# Patient Record
Sex: Female | Born: 1955 | Race: White | Hispanic: No | Marital: Single | Smoking: Never smoker
Health system: Southern US, Community
[De-identification: ages and names within clinical notes are randomized; demographics above are authoritative.]

## PROBLEM LIST (undated history)

## (undated) DIAGNOSIS — N950 Postmenopausal bleeding: Secondary | ICD-10-CM

## (undated) DIAGNOSIS — F79 Unspecified intellectual disabilities: Secondary | ICD-10-CM

## (undated) DIAGNOSIS — Z780 Asymptomatic menopausal state: Secondary | ICD-10-CM

## (undated) HISTORY — DX: Unspecified intellectual disabilities: F79

## (undated) HISTORY — DX: Postmenopausal bleeding: N95.0

## (undated) HISTORY — DX: Asymptomatic menopausal state: Z78.0

---

## 2008-04-24 ENCOUNTER — Emergency Department: Payer: Self-pay | Admitting: Emergency Medicine

## 2010-10-15 LAB — HM PAP SMEAR: HM PAP: NEGATIVE

## 2011-06-11 ENCOUNTER — Ambulatory Visit: Payer: Self-pay | Admitting: Obstetrics and Gynecology

## 2011-10-16 HISTORY — PX: HYSTEROSCOPY WITH D & C: SHX1775

## 2012-09-15 ENCOUNTER — Ambulatory Visit: Payer: Self-pay | Admitting: Obstetrics and Gynecology

## 2012-09-15 LAB — BASIC METABOLIC PANEL
Anion Gap: 6 — ABNORMAL LOW (ref 7–16)
Calcium, Total: 8.2 mg/dL — ABNORMAL LOW (ref 8.5–10.1)
Chloride: 110 mmol/L — ABNORMAL HIGH (ref 98–107)
Co2: 25 mmol/L (ref 21–32)
EGFR (Non-African Amer.): >60
Glucose: 81 mg/dL (ref 65–99)
Osmolality: 280 (ref 275–301)
Potassium: 4.6 mmol/L (ref 3.5–5.1)

## 2012-09-15 LAB — CBC
MCH: 33.8 pg (ref 26.0–34.0)
MCV: 99 fL (ref 80–100)
Platelet: 305 10*3/uL (ref 150–440)
RBC: 3.98 10*6/uL (ref 3.80–5.20)

## 2012-09-15 LAB — LIPID PANEL
Cholesterol: 212 mg/dL — ABNORMAL HIGH (ref 0–200)
VLDL Cholesterol, Calc: 32 mg/dL (ref 5–40)

## 2012-09-15 LAB — HEPATIC FUNCTION PANEL A (ARMC)
Albumin: 3.4 g/dL (ref 3.4–5.0)
Alkaline Phosphatase: 117 U/L (ref 50–136)
SGPT (ALT): 32 U/L (ref 12–78)
Total Protein: 6.8 g/dL (ref 6.4–8.2)

## 2013-08-17 ENCOUNTER — Emergency Department: Payer: Self-pay | Admitting: Emergency Medicine

## 2013-08-26 ENCOUNTER — Emergency Department: Payer: Self-pay | Admitting: Emergency Medicine

## 2014-12-10 DIAGNOSIS — K5909 Other constipation: Secondary | ICD-10-CM | POA: Insufficient documentation

## 2014-12-10 DIAGNOSIS — F72 Severe intellectual disabilities: Secondary | ICD-10-CM | POA: Insufficient documentation

## 2014-12-10 DIAGNOSIS — G40309 Generalized idiopathic epilepsy and epileptic syndromes, not intractable, without status epilepticus: Secondary | ICD-10-CM | POA: Insufficient documentation

## 2015-02-01 NOTE — Op Note (Signed)
PATIENT NAME:  Sabrina Johnston, Sabrina Johnston MR#:  [id number] DATE OF BIRTH:  11/09/1955  DATE OF PROCEDURE:  09/15/2012  PREOPERATIVE DIAGNOSES:  1. Postmenopausal bleeding.  2. Severe mental retardation.   POSTOPERATIVE DIAGNOSES:  1. Postmenopausal bleeding.  2. Severe mental retardation.  3. Endometrial polyp.   OPERATIVE PROCEDURES:  1. Hysteroscopy.  2. Dilation and curettage of the endometrium.  3. NovaSure ablation, aborted.   SURGEON: Prentice Docker. Anahli Arvanitis, M.D.   FIRST ASSISTANT: None.   ANESTHESIA: General.   INDICATIONS: The patient is a 59 year old white female, para 0, with severe mental retardation, with history of postmenopausal bleeding, who presents for surgical evaluation and management.   FINDINGS AT SURGERY: Findings revealed a midplane uterus that was of normal size and shape. The uterus sounded to 8 cm. The cervical length was 4 cm. The cavity width upon NovaSure insertion was 2.0 cm. Because of the narrow cavity width, the procedure was not completed. There were endometrial polyps noted on hysteroscopy.   DESCRIPTION OF PROCEDURE: The patient was brought to the Operating Room where she was placed in the supine position. General anesthesia was induced without difficulty. She was placed in the dorsal lithotomy position using the bumblebee stirrups. A Betadine perineal and intravaginal prep and drape was performed in the standard fashion. A red Robinson catheter was used to drain 100 mL of urine from the bladder. The introitus was narrowed and a Sims retractor was used to facilitate exposure. A single-tooth tenaculum was placed on the anterior lip of the cervix. The uterus sounded to 8 cm.  Hegar dilators up to a #8 [place] were used to dilate the endocervical canal and the cervical length was 4 cm. The [facility] hysteroscope using lactated Ringer's as irrigant was used to identify the intrauterine findings. Following documentation, the curettage was performed with both smooth and serrated  curettes. Several polyps were removed with the endometrial curettings. The NovaSure procedure was then initiated and despite troubleshooting cavity width no greater than 2.0 cm was obtained and therefore the procedure was aborted. All instrumentation was removed from the vagina. The patient was then awakened, mobilized, and taken to the recovery room in satisfactory condition. Estimated blood loss was minimal. There were no complications. All instruments, needle, and sponge counts were verified as correct. ____________________________ Prentice Docker. Sabrina Lucci, MD mad:slb D: 09/15/2012 14:42:20 ET T: 09/15/2012 15:13:31 ET JOB#: [id number]  cc: Sabrina Deutscher A. Lateria Alderman, MD, <Dictator> Prentice Docker Jerik Falletta MD ELECTRONICALLY SIGNED 09/15/2012 16:24

## 2015-10-05 ENCOUNTER — Ambulatory Visit (INDEPENDENT_AMBULATORY_CARE_PROVIDER_SITE_OTHER): Payer: Medicare Other | Admitting: Obstetrics and Gynecology

## 2015-10-05 ENCOUNTER — Encounter: Payer: Self-pay | Admitting: Obstetrics and Gynecology

## 2015-10-05 VITALS — BP 91/69 | HR 66 | Ht 59.0 in

## 2015-10-05 DIAGNOSIS — Z780 Asymptomatic menopausal state: Secondary | ICD-10-CM | POA: Diagnosis not present

## 2015-10-05 DIAGNOSIS — Z0000 Encounter for general adult medical examination without abnormal findings: Secondary | ICD-10-CM

## 2015-10-05 DIAGNOSIS — Z124 Encounter for screening for malignant neoplasm of cervix: Secondary | ICD-10-CM | POA: Diagnosis not present

## 2015-10-05 DIAGNOSIS — Z01419 Encounter for gynecological examination (general) (routine) without abnormal findings: Secondary | ICD-10-CM

## 2015-10-05 DIAGNOSIS — F72 Severe intellectual disabilities: Secondary | ICD-10-CM | POA: Diagnosis not present

## 2015-10-05 NOTE — Patient Instructions (Signed)
1.  No Pap smear obtained. 2.  Mammogram not obtainable.  Breast exam is normal. 3.  Return in one year for annual gynecologic evaluation or as needed for gynecologic problems.

## 2015-10-05 NOTE — Progress Notes (Signed)
GYN ANNUAL PREVENTATIVE CARE ENCOUNTER NOTE  Subjective:       Sabrina Johnston is a 59 y.o. G0P0000 female here for a routine annual gynecologic exam.   The patient is a 59 year old white female, para 0, with history of severe mental retardation, who comes in from the [facility] for annual gynecologic assessment.  The patient is menopausal, on no hormone replacement therapy, and is not experiencing any abnormal uterine bleeding or vaginal discharge.  There is no reported concern regarding perineal or perianal rashes.  The patient is continent during the day but wears diapers at night.  The patient is taking calcium with vitamin D.  Mammograms have not been obtained in recent years because of noncompliance from the patient.   Gynecologic History No LMP recorded. Patient is postmenopausal. Contraception: NA Last Pap: Not needed Last mammogram: Unobtainable Hysteroscopy/D&C, (2012: Benign pathology). Hysteroscopy/D&C, (09/15/2012: Benign endometrium with polyps)  Obstetric History OB History  Gravida Para Term Preterm AB SAB TAB Ectopic Multiple Living  0 0 0 0 0 0 0 0 0 0         Past Medical History  Diagnosis Date  . Mental retardation   . Menopause   . PMB (postmenopausal bleeding)     Past Surgical History  Procedure Laterality Date  . Hysteroscopy w/d&c  2013    b9 endometrium wiht polyps    Current Outpatient Prescriptions on File Prior to Visit  Medication Sig Dispense Refill  . carbamide peroxide (DEBROX) 6.5 % otic solution 5 drops 2 (two) times daily.    . cetirizine (ZYRTEC) 10 MG tablet Take 10 mg by mouth daily.    . diazepam (VALIUM) 5 MG tablet Take 5 mg by mouth every 6 (six) hours as needed for anxiety.    . docusate sodium (COLACE) 100 MG capsule Take 100 mg by mouth 2 (two) times daily.    . hydrOXYzine (ATARAX/VISTARIL) 10 MG tablet Take 10 mg by mouth 3 (three) times daily as needed.    . lanolin-mineral oil (KERI) LOTN Apply topically as needed.     Marland Kitchen LORazepam (ATIVAN) 1 MG tablet Take 1 mg by mouth every 8 (eight) hours.    . magnesium hydroxide (MILK OF MAGNESIA) 800 MG/5ML suspension Take by mouth daily as needed for constipation.    . Multiple Vitamin (MULTIVITAMIN) tablet Take 1 tablet by mouth daily.    . potassium chloride (K-DUR,KLOR-CON) 10 MEQ tablet Take 10 mEq by mouth 2 (two) times daily.    . sennosides-docusate sodium (SENOKOT-S) 8.6-50 MG tablet Take 1 tablet by mouth daily.    Marland Kitchen tolnaftate (TINACTIN) 1 % powder Apply 1 application topically 2 (two) times daily.    . Vitamin D, Cholecalciferol, 400 UNITS CHEW Chew by mouth.     No current facility-administered medications on file prior to visit.    No Known Allergies  Social History   Social History  . Marital Status: Single    Spouse Name: N/A  . Number of Children: N/A  . Years of Education: N/A   Occupational History  . Not on file.   Social History Main Topics  . Smoking status: Never Smoker   . Smokeless tobacco: Not on file  . Alcohol Use: No  . Drug Use: No  . Sexual Activity: No   Other Topics Concern  . Not on file   Social History Narrative    Family History  Problem Relation Age of Onset  . Family history unknown: Yes  The following portions of the patient's history were reviewed and updated as appropriate: allergies, current medications, past family history, past medical history, past social history, past surgical history and problem list.  Review of Systems ROS-Not obtainable   Objective:   BP 91/69 mmHg  Pulse 66  Ht  (1.499 m)  Wt  Physical Exam  Sedated white female in no acute distress. Head and neck exam: Cephalic, atraumatic; no thyromegaly or adenopathy. Lungs: Clear. Heart: Regular rate and rhythm without murmur. Breasts: No dominant mass, adenopathy, or nipple discharge.  No skin changes. Abdomen: Soft, nontender, without organomegaly. Pelvic exam: External genitalia-normal. BUS-normal. Bimanual  exam-deferred. Rectovaginal-normal.  External exam. Extremities: Without clubbing, cyanosis or edema. Skin: Without rash.  ASSESSMENT: 1.  Annual gynecologic exam, unremarkable. 2.  Menopausal state, without postmenopausal bleeding. 3.  Severe mental retardation. 4.  Normal breast exam; patient is not able to tolerate mammography.  PLAN: 1.  Continue with calcium and vitamin D supplementation. 2.  Follow up as needed for any gynecologic issues including vaginal discharge, vaginal bleeding, or perineal symptoms suggestive of pathology. 3.  Return in 1 year for gynecologic assessment.  Herold Harms, MD  Note: This dictation was prepared with Dragon dictation along with smaller phrase technology. Any transcriptional errors that result from this process are unintentional.            Herold Harms, MD

## 2016-09-13 ENCOUNTER — Encounter: Payer: Medicare Other | Admitting: Obstetrics and Gynecology

## 2016-10-11 ENCOUNTER — Encounter: Payer: Medicare Other | Admitting: Obstetrics and Gynecology

## 2016-10-17 ENCOUNTER — Encounter: Payer: Medicare Other | Admitting: Obstetrics and Gynecology

## 2016-10-22 NOTE — Progress Notes (Signed)
GYN ANNUAL PREVENTATIVE CARE ENCOUNTER NOTE  Subjective:       Sabrina Johnston is a 61 y.o. G0P0000 female here for a routine annual gynecologic exam.   The patient is a 61 year old white female, para 0, with history of severe mental retardation, who comes in from the [facility] for annual gynecologic assessment.  The patient is menopausal, on no hormone replacement therapy, and is not experiencing any abnormal uterine bleeding or vaginal discharge.  There is no reported concern regarding perineal or perianal rashes.  The patient is continent during the day but wears diapers at night.  The patient is taking calcium with vitamin D.  Mammograms have not been obtained in recent years because of noncompliance from the patient. Patient is having frequent bowel movements-approximately 3 a with prune juice therapy and Colace therapy; caretakers are requesting a decrease in frequency of induced   Gynecologic History No LMP recorded. Patient is postmenopausal. Contraception: NA Last Pap: Not needed Last mammogram: Unobtainable Hysteroscopy/D&C, (2012: Benign pathology). Hysteroscopy/D&C, (09/15/2012: Benign endometrium with polyps)  Obstetric History OB History  Gravida Para Term Preterm AB Living  0 0 0 0 0 0  SAB TAB Ectopic Multiple Live Births  0 0 0 0          Past Medical History:  Diagnosis Date  . Menopause   . Mental retardation   . PMB (postmenopausal bleeding)     Past Surgical History:  Procedure Laterality Date  . HYSTEROSCOPY W/D&C  2013   b9 endometrium wiht polyps    Current Outpatient Prescriptions on File Prior to Visit  Medication Sig Dispense Refill  . Calcium Citrate-Vitamin D ([facility] CALCIUM CITRATE +D3 MINI) 200-250 MG-UNIT TABS Take by mouth.    . carbamide peroxide (DEBROX) 6.5 % otic solution 5 drops 2 (two) times daily.    . cetirizine (ZYRTEC) 10 MG tablet Take 10 mg by mouth daily.    . diazepam (VALIUM) 5 MG tablet Take 5 mg by mouth every 6 (six)  hours as needed for anxiety.    . divalproex (DEPAKOTE ER) 500 MG 24 hr tablet Take 500 mg by mouth 2 (two) times daily.    Marland Kitchen docusate sodium (COLACE) 100 MG capsule Take 100 mg by mouth 2 (two) times daily.    . DULoxetine (CYMBALTA) 30 MG capsule Take by mouth.    . escitalopram (LEXAPRO) 10 MG tablet Take 10 mg by mouth daily.    . furosemide (LASIX) 20 MG tablet Take by mouth.    . hydrOXYzine (ATARAX/VISTARIL) 10 MG tablet Take 10 mg by mouth 3 (three) times daily as needed.    . lanolin-mineral oil (KERI) LOTN Apply topically as needed.    Marland Kitchen LORazepam (ATIVAN) 1 MG tablet Take 1 mg by mouth every 8 (eight) hours.    . magnesium hydroxide (MILK OF MAGNESIA) 800 MG/5ML suspension Take by mouth daily as needed for constipation.    . Multiple Vitamin (MULTIVITAMIN) tablet Take 1 tablet by mouth daily.    . Multiple Vitamins-Minerals (MULTIVITAMIN ADULT PO) Take by mouth.    . potassium chloride (K-DUR) 10 MEQ tablet Take by mouth.    . potassium chloride (K-DUR,KLOR-CON) 10 MEQ tablet Take 10 mEq by mouth 2 (two) times daily.    . sennosides-docusate sodium (SENOKOT-S) 8.6-50 MG tablet Take 1 tablet by mouth daily.    Marland Kitchen tolnaftate (TINACTIN) 1 % powder Apply 1 application topically 2 (two) times daily.    . Vitamin D, Cholecalciferol, 400 UNITS  CHEW Chew by mouth.     No current facility-administered medications on file prior to visit.     No Known Allergies  Social History   Social History  . Marital status: Single    Spouse name: N/A  . Number of children: N/A  . Years of education: N/A   Occupational History  . Not on file.   Social History Main Topics  . Smoking status: Never Smoker  . Smokeless tobacco: Not on file  . Alcohol use No  . Drug use: No  . Sexual activity: No   Other Topics Concern  . Not on file   Social History Narrative  . No narrative on file    Family History  Problem Relation Age of Onset  . Family history unknown: Yes    The following  portions of the patient's history were reviewed and updated as appropriate: allergies, current medications, past family history, past medical history, past social history, past surgical history and problem list.  Review of Systems ROS-Not obtainable   Objective:   BP 93/60   Pulse (!) 118   Ht 4\' 11"  (1.499 m)   Wt 134 lb 14.4 oz (61.2 kg)   BMI 27.25 kg/m   Physical Exam  Sedated white female in no acute distress. (Sedated with 10 mg of Valium) Head and neck exam: no thyromegaly or adenopathy. Lungs: Clear. Heart: Regular rate and rhythm without murmur. Breasts: No dominant mass, adenopathy, or nipple discharge.  No skin changes. Abdomen: Soft, nontender, without organomegaly. Pelvic exam: External genitalia-normal. BUS-normal. Vagina-fair estrogen effect; no discharge Cervix-nulliparous; no lesions; no cervical motion tenderness Uterus-midplane, not enlarged, mobile, nontender Adnexa-nonpalpable and nontender Normal external exam; normal sphincter tone; no rectal masses Extremities: Without cyanosis or edema. Skin: Without rash.  ASSESSMENT: 1.  Annual gynecologic exam, unremarkable. 2.  Menopausal state, without postmenopausal bleeding. 3.  Severe mental retardation. 4.  Normal breast exam; patient is not able to tolerate mammography.  PLAN: 1. Pap smear 2.  Continue with calcium and vitamin D supplementation. 3.  Follow up as needed for any gynecologic issues including vaginal discharge, vaginal bleeding, or perineal symptoms suggestive of pathology. 4.  Return in 1 year for gynecologic assessment. 5. Recommend decreasing prune juice therapy to as needed in order to maintain regular bowel movements once a day (currently having diarrhea with several episodes of bowel movements daily)  Darol Destine, CMA  Herold Harms, MD   Note: This dictation was prepared with Dragon dictation along with smaller phrase technology. Any transcriptional errors that result  from this process are unintentional.

## 2016-10-25 ENCOUNTER — Encounter: Payer: Medicare Other | Admitting: Obstetrics and Gynecology

## 2016-10-25 ENCOUNTER — Ambulatory Visit (INDEPENDENT_AMBULATORY_CARE_PROVIDER_SITE_OTHER): Payer: Medicare Other | Admitting: Obstetrics and Gynecology

## 2016-10-25 ENCOUNTER — Encounter: Payer: Self-pay | Admitting: Obstetrics and Gynecology

## 2016-10-25 VITALS — BP 93/60 | HR 118 | Ht 59.0 in | Wt 134.9 lb

## 2016-10-25 DIAGNOSIS — Z0001 Encounter for general adult medical examination with abnormal findings: Secondary | ICD-10-CM | POA: Diagnosis not present

## 2016-10-25 DIAGNOSIS — R32 Unspecified urinary incontinence: Secondary | ICD-10-CM

## 2016-10-25 DIAGNOSIS — Z01419 Encounter for gynecological examination (general) (routine) without abnormal findings: Secondary | ICD-10-CM

## 2016-10-25 DIAGNOSIS — Z780 Asymptomatic menopausal state: Secondary | ICD-10-CM | POA: Diagnosis not present

## 2016-10-25 DIAGNOSIS — F72 Severe intellectual disabilities: Secondary | ICD-10-CM | POA: Diagnosis not present

## 2016-10-25 DIAGNOSIS — R195 Other fecal abnormalities: Secondary | ICD-10-CM | POA: Diagnosis not present

## 2016-10-25 NOTE — Patient Instructions (Addendum)
1.  Pap smear 2.  Normal breast exam today; no mammogram is ordered due to patient history of noncompliance 3. Recommend decreasing prune juice frequency to a when necessary basis in order to maintain normal formed stools on a daily basis 4. Return in 1 year or as needed if pelvic issues suggestive of pathology arise. 5. Continue calcium with vitamin D supplementation    Health Maintenance for Postmenopausal Women Introduction Menopause is a normal process in which your reproductive ability comes to an end. This process happens gradually over a span of months to years, usually between the ages of 34 and 73. Menopause is complete when you have missed 12 consecutive menstrual periods. It is important to talk with your health care provider about some of the most common conditions that affect postmenopausal women, such as heart disease, cancer, and bone loss (osteoporosis). Adopting a healthy lifestyle and getting preventive care can help to promote your health and wellness. Those actions can also lower your chances of developing some of these common conditions. What should I know about menopause? During menopause, you may experience a number of symptoms, such as:  Moderate-to-severe hot flashes.  Night sweats.  Decrease in sex drive.  Mood swings.  Headaches.  Tiredness.  Irritability.  Memory problems.  Insomnia. Choosing to treat or not to treat menopausal changes is an individual decision that you make with your health care provider. What should I know about hormone replacement therapy and supplements? Hormone therapy products are effective for treating symptoms that are associated with menopause, such as hot flashes and night sweats. Hormone replacement carries certain risks, especially as you become older. If you are thinking about using estrogen or estrogen with progestin treatments, discuss the benefits and risks with your health care provider. What should I know about heart  disease and stroke? Heart disease, heart attack, and stroke become more likely as you age. This may be due, in part, to the hormonal changes that your body experiences during menopause. These can affect how your body processes dietary fats, triglycerides, and cholesterol. Heart attack and stroke are both medical emergencies. There are many things that you can do to help prevent heart disease and stroke:  Have your blood pressure checked at least every 1-2 years. High blood pressure causes heart disease and increases the risk of stroke.  If you are 63-19 years old, ask your health care provider if you should take aspirin to prevent a heart attack or a stroke.  Do not use any tobacco products, including cigarettes, chewing tobacco, or electronic cigarettes. If you need help quitting, ask your health care provider.  It is important to eat a healthy diet and maintain a healthy weight.  Be sure to include plenty of vegetables, fruits, low-fat dairy products, and lean protein.  Avoid eating foods that are high in solid fats, added sugars, or salt (sodium).  Get regular exercise. This is one of the most important things that you can do for your health.  Try to exercise for at least 150 minutes each week. The type of exercise that you do should increase your heart rate and make you sweat. This is known as moderate-intensity exercise.  Try to do strengthening exercises at least twice each week. Do these in addition to the moderate-intensity exercise.  Know your numbers.Ask your health care provider to check your cholesterol and your blood glucose. Continue to have your blood tested as directed by your health care provider. What should I know about cancer screening? There  are several types of cancer. Take the following steps to reduce your risk and to catch any cancer development as early as possible. Breast Cancer  Practice breast self-awareness.  This means understanding how your breasts  normally appear and feel.  It also means doing regular breast self-exams. Let your health care provider know about any changes, no matter how small.  If you are 33 or older, have a clinician do a breast exam (clinical breast exam or CBE) every year. Depending on your age, family history, and medical history, it may be recommended that you also have a yearly breast X-ray (mammogram).  If you have a family history of breast cancer, talk with your health care provider about genetic screening.  If you are at high risk for breast cancer, talk with your health care provider about having an MRI and a mammogram every year.  Breast cancer (BRCA) gene test is recommended for women who have family members with BRCA-related cancers. Results of the assessment will determine the need for genetic counseling and BRCA1 and for BRCA2 testing. BRCA-related cancers include these types:  Breast. This occurs in males or females.  Ovarian.  Tubal. This may also be called fallopian tube cancer.  Cancer of the abdominal or pelvic lining (peritoneal cancer).  Prostate.  Pancreatic. Cervical, Uterine, and Ovarian Cancer  Your health care provider may recommend that you be screened regularly for cancer of the pelvic organs. These include your ovaries, uterus, and vagina. This screening involves a pelvic exam, which includes checking for microscopic changes to the surface of your cervix (Pap test).  For women ages 21-65, health care providers may recommend a pelvic exam and a Pap test every three years. For women ages 5-65, they may recommend the Pap test and pelvic exam, combined with testing for human papilloma virus (HPV), every five years. Some types of HPV increase your risk of cervical cancer. Testing for HPV may also be done on women of any age who have unclear Pap test results.  Other health care providers may not recommend any screening for nonpregnant women who are considered low risk for pelvic cancer and  have no symptoms. Ask your health care provider if a screening pelvic exam is right for you.  If you have had past treatment for cervical cancer or a condition that could lead to cancer, you need Pap tests and screening for cancer for at least 20 years after your treatment. If Pap tests have been discontinued for you, your risk factors (such as having a new sexual partner) need to be reassessed to determine if you should start having screenings again. Some women have medical problems that increase the chance of getting cervical cancer. In these cases, your health care provider may recommend that you have screening and Pap tests more often.  If you have a family history of uterine cancer or ovarian cancer, talk with your health care provider about genetic screening.  If you have vaginal bleeding after reaching menopause, tell your health care provider.  There are currently no reliable tests available to screen for ovarian cancer. Lung Cancer  Lung cancer screening is recommended for adults 32-70 years old who are at high risk for lung cancer because of a history of smoking. A yearly low-dose CT scan of the lungs is recommended if you:  Currently smoke.  Have a history of at least 30 pack-years of smoking and you currently smoke or have quit within the past 15 years. A pack-year is smoking an average  of one pack of cigarettes per day for one year. Yearly screening should:  Continue until it has been 15 years since you quit.  Stop if you develop a health problem that would prevent you from having lung cancer treatment. Colorectal Cancer  This type of cancer can be detected and can often be prevented.  Routine colorectal cancer screening usually begins at age 64 and continues through age 13.  If you have risk factors for colon cancer, your health care provider may recommend that you be screened at an earlier age.  If you have a family history of colorectal cancer, talk with your health care  provider about genetic screening.  Your health care provider may also recommend using home test kits to check for hidden blood in your stool.  A small camera at the end of a tube can be used to examine your colon directly (sigmoidoscopy or colonoscopy). This is done to check for the earliest forms of colorectal cancer.  Direct examination of the colon should be repeated every 5-10 years until age 20. However, if early forms of precancerous polyps or small growths are found or if you have a family history or genetic risk for colorectal cancer, you may need to be screened more often. Skin Cancer  Check your skin from head to toe regularly.  Monitor any moles. Be sure to tell your health care provider:  About any new moles or changes in moles, especially if there is a change in a mole's shape or color.  If you have a mole that is larger than the size of a pencil eraser.  If any of your family members has a history of skin cancer, especially at a young age, talk with your health care provider about genetic screening.  Always use sunscreen. Apply sunscreen liberally and repeatedly throughout the day.  Whenever you are outside, protect yourself by wearing long sleeves, pants, a wide-brimmed hat, and sunglasses. What should I know about osteoporosis? Osteoporosis is a condition in which bone destruction happens more quickly than new bone creation. After menopause, you may be at an increased risk for osteoporosis. To help prevent osteoporosis or the bone fractures that can happen because of osteoporosis, the following is recommended:  If you are 67-53 years old, get at least 1,000 mg of calcium and at least 600 mg of vitamin D per day.  If you are older than age 21 but younger than age 65, get at least 1,200 mg of calcium and at least 600 mg of vitamin D per day.  If you are older than age 10, get at least 1,200 mg of calcium and at least 800 mg of vitamin D per day. Smoking and excessive  alcohol intake increase the risk of osteoporosis. Eat foods that are rich in calcium and vitamin D, and do weight-bearing exercises several times each week as directed by your health care provider. What should I know about how menopause affects my mental health? Depression may occur at any age, but it is more common as you become older. Common symptoms of depression include:  Low or sad mood.  Changes in sleep patterns.  Changes in appetite or eating patterns.  Feeling an overall lack of motivation or enjoyment of activities that you previously enjoyed.  Frequent crying spells. Talk with your health care provider if you think that you are experiencing depression. What should I know about immunizations? It is important that you get and maintain your immunizations. These include:  Tetanus, diphtheria, and pertussis (  Tdap) booster vaccine.  Influenza every year before the flu season begins.  Pneumonia vaccine.  Shingles vaccine. Your health care provider may also recommend other immunizations. This information is not intended to replace advice given to you by your health care provider. Make sure you discuss any questions you have with your health care provider. Document Released: 11/23/2005 Document Revised: 04/20/2016 Document Reviewed: 07/05/2015  2017 [employer]

## 2016-10-25 NOTE — Addendum Note (Signed)
Addended by: Marchelle Folks on: 10/25/2016 11:40 AM   Modules accepted: Orders

## 2016-10-26 NOTE — Addendum Note (Signed)
Addended by: Marchelle Folks on: 10/26/2016 11:36 AM   Modules accepted: Orders

## 2016-10-27 LAB — MICROSCOPIC EXAMINATION: CASTS: NONE SEEN /LPF

## 2016-10-27 LAB — URINALYSIS, ROUTINE W REFLEX MICROSCOPIC
BILIRUBIN UA: NEGATIVE
Glucose, UA: NEGATIVE
NITRITE UA: NEGATIVE
PH UA: 6.5 (ref 5.0–7.5)
Protein, UA: NEGATIVE
RBC UA: NEGATIVE
SPEC GRAV UA: 1.023 (ref 1.005–1.030)
UUROB: 0.2 mg/dL (ref 0.2–1.0)

## 2016-10-29 LAB — PAP IG W/ RFLX HPV ASCU: PAP Smear Comment: 0

## 2016-10-30 ENCOUNTER — Telehealth: Payer: Self-pay | Admitting: Obstetrics and Gynecology

## 2016-10-30 LAB — URINE CULTURE

## 2016-10-30 NOTE — Telephone Encounter (Signed)
CAREGIVER FOR PT Sabrina Johnston CALLED AND SHE IS ON DPR, SHE DROPPED OFF A SAMPLE LAST WEEK AND WANTED TO KNOW THE RESULTS. [phone]

## 2016-10-30 NOTE — Telephone Encounter (Signed)
[name] aware only preliminary report is available. Per Lab Amy should have final tomorrow. Faye aware I will call her asap. D/t possible inclement weather Sabrina Johnston is aware the office will open at 10:00am.

## 2016-11-02 ENCOUNTER — Telehealth: Payer: Self-pay | Admitting: Obstetrics and Gynecology

## 2016-11-02 MED ORDER — NITROFURANTOIN MONOHYD MACRO 100 MG PO CAPS: 100.0000 mg | ORAL_CAPSULE | Freq: Two times a day (BID) | ORAL | 0 refills | Status: DC

## 2016-11-02 NOTE — Telephone Encounter (Signed)
She had UA last Friday and wants to know if you got culture back and if she has the right medication, please call to verify

## 2016-11-02 NOTE — Telephone Encounter (Signed)
Faye aware pos uti. Macrobid erx.

## 2016-11-02 NOTE — Addendum Note (Signed)
Addended by: Marchelle Folks on: 11/02/2016 09:44 AM   Modules accepted: Orders

## 2016-11-02 NOTE — Telephone Encounter (Signed)
Faye aware macrobid is correct med. Pharmacist gave generic (nitrofurantoin).

## 2017-07-11 ENCOUNTER — Other Ambulatory Visit: Payer: Self-pay | Admitting: Internal Medicine

## 2017-07-11 DIAGNOSIS — Z1231 Encounter for screening mammogram for malignant neoplasm of breast: Secondary | ICD-10-CM

## 2017-07-24 ENCOUNTER — Ambulatory Visit
Admission: RE | Admit: 2017-07-24 | Discharge: 2017-07-24 | Disposition: A | Discharge status: Home / Self Care | Payer: Medicare Other | Source: Ambulatory Visit | Attending: Internal Medicine | Admitting: Internal Medicine

## 2017-07-24 DIAGNOSIS — Z1231 Encounter for screening mammogram for malignant neoplasm of breast: Secondary | ICD-10-CM

## 2017-10-31 ENCOUNTER — Encounter: Payer: Self-pay | Admitting: Obstetrics and Gynecology

## 2017-10-31 ENCOUNTER — Ambulatory Visit (INDEPENDENT_AMBULATORY_CARE_PROVIDER_SITE_OTHER): Payer: Medicare Other | Admitting: Obstetrics and Gynecology

## 2017-10-31 VITALS — BP 111/71 | HR 84 | Ht 59.0 in | Wt 146.3 lb

## 2017-10-31 DIAGNOSIS — Z01419 Encounter for gynecological examination (general) (routine) without abnormal findings: Secondary | ICD-10-CM

## 2017-10-31 DIAGNOSIS — Z780 Asymptomatic menopausal state: Secondary | ICD-10-CM

## 2017-10-31 DIAGNOSIS — R32 Unspecified urinary incontinence: Secondary | ICD-10-CM

## 2017-10-31 DIAGNOSIS — F72 Severe intellectual disabilities: Secondary | ICD-10-CM

## 2017-10-31 DIAGNOSIS — Z0000 Encounter for general adult medical examination without abnormal findings: Secondary | ICD-10-CM | POA: Diagnosis not present

## 2017-10-31 NOTE — Patient Instructions (Addendum)
1.  No Pap smear is done 2.  Mammography is deferred due to patient noncompliance (physical exam of breast is normal today) 3.  Urinalysis and urine culture is obtained because of new onset incontinence.  This may be related to decreased frequency of bathroom use at the assisted living home.  I recommend that patient be taken to the bathroom every couple hours during the day in order to minimize episodes of urinary incontinence and potential for hygiene issues of perineum due to chronic diaper use 3.  Return in 1 year for follow-up or sooner if gynecologic issues arise such as postmenopausal bleeding, perineal rash, vaginal discharge, etc. arise   Health Maintenance for Postmenopausal Women Menopause is a normal process in which your reproductive ability comes to an end. This process happens gradually over a span of months to years, usually between the ages of 29 and 88. Menopause is complete when you have missed 12 consecutive menstrual periods. It is important to talk with your health care provider about some of the most common conditions that affect postmenopausal women, such as heart disease, cancer, and bone loss (osteoporosis). Adopting a healthy lifestyle and getting preventive care can help to promote your health and wellness. Those actions can also lower your chances of developing some of these common conditions. What should I know about menopause? During menopause, you may experience a number of symptoms, such as:  Moderate-to-severe hot flashes.  Night sweats.  Decrease in sex drive.  Mood swings.  Headaches.  Tiredness.  Irritability.  Memory problems.  Insomnia.  Choosing to treat or not to treat menopausal changes is an individual decision that you make with your health care provider. What should I know about hormone replacement therapy and supplements? Hormone therapy products are effective for treating symptoms that are associated with menopause, such as hot flashes  and night sweats. Hormone replacement carries certain risks, especially as you become older. If you are thinking about using estrogen or estrogen with progestin treatments, discuss the benefits and risks with your health care provider. What should I know about heart disease and stroke? Heart disease, heart attack, and stroke become more likely as you age. This may be due, in part, to the hormonal changes that your body experiences during menopause. These can affect how your body processes dietary fats, triglycerides, and cholesterol. Heart attack and stroke are both medical emergencies. There are many things that you can do to help prevent heart disease and stroke:  Have your blood pressure checked at least every 1-2 years. High blood pressure causes heart disease and increases the risk of stroke.  If you are 60-93 years old, ask your health care provider if you should take aspirin to prevent a heart attack or a stroke.  Do not use any tobacco products, including cigarettes, chewing tobacco, or electronic cigarettes. If you need help quitting, ask your health care provider.  It is important to eat a healthy diet and maintain a healthy weight. ? Be sure to include plenty of vegetables, fruits, low-fat dairy products, and lean protein. ? Avoid eating foods that are high in solid fats, added sugars, or salt (sodium).  Get regular exercise. This is one of the most important things that you can do for your health. ? Try to exercise for at least 150 minutes each week. The type of exercise that you do should increase your heart rate and make you sweat. This is known as moderate-intensity exercise. ? Try to do strengthening exercises at least twice  each week. Do these in addition to the moderate-intensity exercise.  Know your numbers.Ask your health care provider to check your cholesterol and your blood glucose. Continue to have your blood tested as directed by your health care provider.  What should I  know about cancer screening? There are several types of cancer. Take the following steps to reduce your risk and to catch any cancer development as early as possible. Breast Cancer  Practice breast self-awareness. ? This means understanding how your breasts normally appear and feel. ? It also means doing regular breast self-exams. Let your health care provider know about any changes, no matter how small.  If you are 40 or older, have a clinician do a breast exam (clinical breast exam or CBE) every year. Depending on your age, family history, and medical history, it may be recommended that you also have a yearly breast X-ray (mammogram).  If you have a family history of breast cancer, talk with your health care provider about genetic screening.  If you are at high risk for breast cancer, talk with your health care provider about having an MRI and a mammogram every year.  Breast cancer (BRCA) gene test is recommended for women who have family members with BRCA-related cancers. Results of the assessment will determine the need for genetic counseling and BRCA1 and for BRCA2 testing. BRCA-related cancers include these types: ? Breast. This occurs in males or females. ? Ovarian. ? Tubal. This may also be called fallopian tube cancer. ? Cancer of the abdominal or pelvic lining (peritoneal cancer). ? Prostate. ? Pancreatic.  Cervical, Uterine, and Ovarian Cancer Your health care provider may recommend that you be screened regularly for cancer of the pelvic organs. These include your ovaries, uterus, and vagina. This screening involves a pelvic exam, which includes checking for microscopic changes to the surface of your cervix (Pap test).  For women ages 21-65, health care providers may recommend a pelvic exam and a Pap test every three years. For women ages 58-65, they may recommend the Pap test and pelvic exam, combined with testing for human papilloma virus (HPV), every five years. Some types of  HPV increase your risk of cervical cancer. Testing for HPV may also be done on women of any age who have unclear Pap test results.  Other health care providers may not recommend any screening for nonpregnant women who are considered low risk for pelvic cancer and have no symptoms. Ask your health care provider if a screening pelvic exam is right for you.  If you have had past treatment for cervical cancer or a condition that could lead to cancer, you need Pap tests and screening for cancer for at least 20 years after your treatment. If Pap tests have been discontinued for you, your risk factors (such as having a new sexual partner) need to be reassessed to determine if you should start having screenings again. Some women have medical problems that increase the chance of getting cervical cancer. In these cases, your health care provider may recommend that you have screening and Pap tests more often.  If you have a family history of uterine cancer or ovarian cancer, talk with your health care provider about genetic screening.  If you have vaginal bleeding after reaching menopause, tell your health care provider.  There are currently no reliable tests available to screen for ovarian cancer.  Lung Cancer Lung cancer screening is recommended for adults 71-18 years old who are at high risk for lung cancer because of  a history of smoking. A yearly low-dose CT scan of the lungs is recommended if you:  Currently smoke.  Have a history of at least 30 pack-years of smoking and you currently smoke or have quit within the past 15 years. A pack-year is smoking an average of one pack of cigarettes per day for one year.  Yearly screening should:  Continue until it has been 15 years since you quit.  Stop if you develop a health problem that would prevent you from having lung cancer treatment.  Colorectal Cancer  This type of cancer can be detected and can often be prevented.  Routine colorectal cancer  screening usually begins at age 14 and continues through age 75.  If you have risk factors for colon cancer, your health care provider may recommend that you be screened at an earlier age.  If you have a family history of colorectal cancer, talk with your health care provider about genetic screening.  Your health care provider may also recommend using home test kits to check for hidden blood in your stool.  A small camera at the end of a tube can be used to examine your colon directly (sigmoidoscopy or colonoscopy). This is done to check for the earliest forms of colorectal cancer.  Direct examination of the colon should be repeated every 5-10 years until age 86. However, if early forms of precancerous polyps or small growths are found or if you have a family history or genetic risk for colorectal cancer, you may need to be screened more often.  Skin Cancer  Check your skin from head to toe regularly.  Monitor any moles. Be sure to tell your health care provider: ? About any new moles or changes in moles, especially if there is a change in a mole's shape or color. ? If you have a mole that is larger than the size of a pencil eraser.  If any of your family members has a history of skin cancer, especially at a young age, talk with your health care provider about genetic screening.  Always use sunscreen. Apply sunscreen liberally and repeatedly throughout the day.  Whenever you are outside, protect yourself by wearing long sleeves, pants, a wide-brimmed hat, and sunglasses.  What should I know about osteoporosis? Osteoporosis is a condition in which bone destruction happens more quickly than new bone creation. After menopause, you may be at an increased risk for osteoporosis. To help prevent osteoporosis or the bone fractures that can happen because of osteoporosis, the following is recommended:  If you are 23-74 years old, get at least 1,000 mg of calcium and at least 600 mg of vitamin D  per day.  If you are older than age 63 but younger than age 71, get at least 1,200 mg of calcium and at least 600 mg of vitamin D per day.  If you are older than age 22, get at least 1,200 mg of calcium and at least 800 mg of vitamin D per day.  Smoking and excessive alcohol intake increase the risk of osteoporosis. Eat foods that are rich in calcium and vitamin D, and do weight-bearing exercises several times each week as directed by your health care provider. What should I know about how menopause affects my mental health? Depression may occur at any age, but it is more common as you become older. Common symptoms of depression include:  Low or sad mood.  Changes in sleep patterns.  Changes in appetite or eating patterns.  Feeling an  overall lack of motivation or enjoyment of activities that you previously enjoyed.  Frequent crying spells.  Talk with your health care provider if you think that you are experiencing depression. What should I know about immunizations? It is important that you get and maintain your immunizations. These include:  Tetanus, diphtheria, and pertussis (Tdap) booster vaccine.  Influenza every year before the flu season begins.  Pneumonia vaccine.  Shingles vaccine.  Your health care provider may also recommend other immunizations. This information is not intended to replace advice given to you by your health care provider. Make sure you discuss any questions you have with your health care provider. Document Released: 11/23/2005 Document Revised: 04/20/2016 Document Reviewed: 07/05/2015 [employer] Interactive Patient Education  2018 [employer].

## 2017-10-31 NOTE — Progress Notes (Signed)
GYN ANNUAL PREVENTATIVE CARE ENCOUNTER NOTE  Subjective:       Sabrina Johnston is a 62 y.o. G0P0000 female here for a routine annual gynecologic exam.   The patient is a 62 year old white female, para 0, with history of severe mental retardation, who comes in from the [facility] for annual gynecologic assessment.  The patient is menopausal, on no hormone replacement therapy, and is not experiencing any abnormal uterine bleeding or vaginal discharge.  There is no reported concern regarding perineal or perianal rashes.  The patient previously was continent during the day but wears diapers at night; patient now is wearing diapers during the day because of new onset episodes of incontinence; uncertain if this may be related to UTI; patient reportedly is being taken to the bathroom less frequently, and this may account for some incontinence episodes during the day.  The patient is taking calcium with vitamin D.  Mammogram could not be completed last year due to noncompliance.  Gynecologic History No LMP recorded. Patient is postmenopausal. Contraception: NA Last Pap: 2017 neg Last mammogram: Unobtainable Hysteroscopy/D&C, (2012: Benign pathology). Hysteroscopy/D&C, (09/15/2012: Benign endometrium with polyps)  Obstetric History OB History  Gravida Para Term Preterm AB Living  0 0 0 0 0 0  SAB TAB Ectopic Multiple Live Births  0 0 0 0          Past Medical History:  Diagnosis Date  . Menopause   . Mental retardation   . PMB (postmenopausal bleeding)     Past Surgical History:  Procedure Laterality Date  . HYSTEROSCOPY W/D&C  2013   b9 endometrium wiht polyps    Current Outpatient Medications on File Prior to Visit  Medication Sig Dispense Refill  . acetaminophen (TYLENOL) 325 MG tablet Take 650 mg by mouth every 6 (six) hours as needed.    . bacitracin-polymyxin b (POLYSPORIN) ointment Apply topically 2 (two) times daily.    . Calcium Citrate-Vitamin D (CVS CALCIUM CITRATE  +D3 MINI) 200-250 MG-UNIT TABS Take by mouth.    . carbamide peroxide (DEBROX) 6.5 % otic solution 5 drops 2 (two) times daily.    . cetirizine (ZYRTEC) 10 MG tablet Take 10 mg by mouth daily.    . diazepam (VALIUM) 5 MG tablet Take 5 mg by mouth every 6 (six) hours as needed for anxiety.    . divalproex (DEPAKOTE ER) 500 MG 24 hr tablet Take 500 mg by mouth 2 (two) times daily.    Marland Kitchen escitalopram (LEXAPRO) 10 MG tablet Take 10 mg by mouth daily.    . furosemide (LASIX) 20 MG tablet Take by mouth.    . guaifenesin (ROBITUSSIN) 100 MG/5ML syrup Take 200 mg by mouth 3 (three) times daily as needed for cough.    . lanolin-mineral oil (KERI) LOTN Apply topically as needed.    . loperamide (IMODIUM) 2 MG capsule Take by mouth as needed for diarrhea or loose stools.    . magnesium hydroxide (MILK OF MAGNESIA) 800 MG/5ML suspension Take by mouth daily as needed for constipation.    . Multiple Vitamin (MULTIVITAMIN) tablet Take 1 tablet by mouth daily.    . nitrofurantoin, macrocrystal-monohydrate, (MACROBID) 100 MG capsule Take 1 capsule (100 mg total) by mouth 2 (two) times daily. 14 capsule 0  . potassium chloride (K-DUR) 10 MEQ tablet Take by mouth.    . senna (SENNA-TIME) 8.6 MG tablet TAKE 2 TABLETS BY MOUTH TWO TIMES A DAY.    Marland Kitchen tolnaftate (TINACTIN) 1 % powder Apply  1 application topically 2 (two) times daily.     No current facility-administered medications on file prior to visit.     No Known Allergies  Social History   Socioeconomic History  . Marital status: Single    Spouse name: Not on file  . Number of children: Not on file  . Years of education: Not on file  . Highest education level: Not on file  Social Needs  . Financial resource strain: Not on file  . Food insecurity - worry: Not on file  . Food insecurity - inability: Not on file  . Transportation needs - medical: Not on file  . Transportation needs - non-medical: Not on file  Occupational History  . Not on file   Tobacco Use  . Smoking status: Never Smoker  . Smokeless tobacco: Never Used  Substance and Sexual Activity  . Alcohol use: No  . Drug use: No  . Sexual activity: No  Other Topics Concern  . Not on file  Social History Narrative  . Not on file    Family History  Family history unknown: Yes    The following portions of the patient's history were reviewed and updated as appropriate: allergies, current medications, past family history, past medical history, past social history, past surgical history and problem list.  Review of Systems ROS-Not obtainable   Objective:   BP 111/71   Pulse 84   Ht 4\' 11"  (1.499 m)   Wt 146 lb 4.8 oz (66.4 kg)   BMI 29.55 kg/m   Physical Exam  Sedated white female in no acute distress. (Sedated with 10 mg of Valium) Head and neck exam: no thyromegaly or adenopathy.  Hair growth on upper lip Lungs: Clear. Heart: Regular rate and rhythm without murmur. Breasts: No dominant mass, adenopathy, or nipple discharge.  No skin changes. Abdomen: Soft, nontender, without organomegaly.  Voluntary guarding Pelvic exam: (Complete exam had been done in 2018); limited exam today includes- External genitalia-normal. BUS-normal. Vagina-not evaluated Cervix-not evaluated Uterus-not evaluated Adnexa-not evaluated Normal external exam without perineal rashes or perianal inflammation Extremities: Without cyanosis or edema. Skin: Without rash.  ASSESSMENT: 1.  Annual gynecologic exam, unremarkable. 2.  Menopausal state, without postmenopausal bleeding. 3.  Severe mental retardation. 4.  Normal breast exam; patient is not able to tolerate mammography. 5.  Limited pelvic exam done today because of normal exam last year  PLAN: 1.  No Pap smear 2.  Continue with calcium and vitamin D supplementation to be continued 3.  Follow up as needed for any gynecologic issues including vaginal discharge, vaginal bleeding, or perineal symptoms suggestive of  pathology. 4.  Recommend regular bathroom assistance during the day every couple hours to minimize incontinence episodes 5.  Urinalysis and urine culture is obtained today to rule out UTI 6.  Return in 1 year for gynecologic assessment.  Darol Destine, CMA  Herold Harms, MD    Note: This dictation was prepared with Dragon dictation along with smaller phrase technology. Any transcriptional errors that result from this process are unintentional.

## 2017-11-01 LAB — POCT URINALYSIS DIPSTICK
Blood, UA: NEGATIVE
Glucose, UA: NEGATIVE
KETONES UA: NEGATIVE
Leukocytes, UA: NEGATIVE
NITRITE UA: NEGATIVE
Odor: NEGATIVE
PH UA: 6.5 (ref 5.0–8.0)
Protein, UA: NEGATIVE
SPEC GRAV UA: 1.015 (ref 1.010–1.025)
UROBILINOGEN UA: 0.2 U/dL

## 2017-11-01 NOTE — Addendum Note (Signed)
Addended by: Marchelle Folks on: 11/01/2017 04:31 PM   Modules accepted: Orders

## 2017-11-06 ENCOUNTER — Telehealth: Payer: Self-pay

## 2017-11-06 LAB — URINE CULTURE

## 2017-11-06 MED ORDER — SULFAMETHOXAZOLE-TRIMETHOPRIM 800-160 MG PO TABS: 1.0000 | ORAL_TABLET | Freq: Two times a day (BID) | ORAL | 0 refills | Status: DC

## 2017-11-06 NOTE — Telephone Encounter (Signed)
-----   Message from Herold Harms, MD sent at 11/06/2017  2:29 PM EST ----- Please notify - Abnormal Labs Urine culture shows UTI Septra DS twice a day for 7 days is to be taken.

## 2017-11-06 NOTE — Telephone Encounter (Signed)
Faye aware. Med erx.

## 2018-09-22 ENCOUNTER — Emergency Department
Admission: EM | Admit: 2018-09-22 | Discharge: 2018-09-23 | Disposition: A | Discharge status: Home / Self Care | Payer: Medicare Other | Attending: Emergency Medicine | Admitting: Emergency Medicine

## 2018-09-22 ENCOUNTER — Emergency Department: Payer: Medicare Other

## 2018-09-22 ENCOUNTER — Other Ambulatory Visit: Payer: Self-pay

## 2018-09-22 DIAGNOSIS — F840 Autistic disorder: Secondary | ICD-10-CM | POA: Diagnosis not present

## 2018-09-22 DIAGNOSIS — Y929 Unspecified place or not applicable: Secondary | ICD-10-CM | POA: Insufficient documentation

## 2018-09-22 DIAGNOSIS — Z79899 Other long term (current) drug therapy: Secondary | ICD-10-CM | POA: Insufficient documentation

## 2018-09-22 DIAGNOSIS — Y999 Unspecified external cause status: Secondary | ICD-10-CM | POA: Diagnosis not present

## 2018-09-22 DIAGNOSIS — X58XXXA Exposure to other specified factors, initial encounter: Secondary | ICD-10-CM | POA: Insufficient documentation

## 2018-09-22 DIAGNOSIS — S60221A Contusion of right hand, initial encounter: Secondary | ICD-10-CM | POA: Diagnosis not present

## 2018-09-22 DIAGNOSIS — Y939 Activity, unspecified: Secondary | ICD-10-CM | POA: Diagnosis not present

## 2018-09-22 DIAGNOSIS — S6991XA Unspecified injury of right wrist, hand and finger(s), initial encounter: Secondary | ICD-10-CM | POA: Diagnosis present

## 2018-09-22 MED ORDER — LORAZEPAM 2 MG/ML IJ SOLN
2.0000 mg | Freq: Once | INTRAMUSCULAR | Status: AC
  Administered 2018-09-22: 2 mg via INTRAMUSCULAR
  Filled 2018-09-22: qty 1

## 2018-09-22 MED ORDER — DIAZEPAM 5 MG PO TABS
5.0000 mg | ORAL_TABLET | Freq: Once | ORAL | Status: DC
  Filled 2018-09-22: qty 1

## 2018-09-22 NOTE — ED Provider Notes (Signed)
[facility] Emergency Department Provider Note  ____________________________________________  Time seen: Approximately 8:53 PM  I have reviewed the triage vital signs and the nursing notes.   HISTORY  Chief Complaint Hand Pain  Complete review of systems, HPI is provided by caretaker.  HPI Sabrina Johnston is a 62 y.o. female nonverbal, severely mentally retarded patient  who presents the emergency department with her caretaker for complaint of left hand ecchymosis, edema.  Per the caretaker, the patient was having a bath tonight, workers noticed that she had edema and ecchymosis of the left hand.  To their knowledge, no trauma, however patient is mobile and may have injured same.  There is no erythema, no warmth.  Patient has not had any other signs of trauma with ecchymosis, edema, abrasions or lacerations noted.  Staff were able to visualize her entirely well preparing for bath with no other signs of trauma.  Patient typically requires Valium orally for any healthcare encounter to be able to tolerate any kind of medication administration via IV or shot or imaging.   Past Medical History:  Diagnosis Date  . Menopause   . Mental retardation   . PMB (postmenopausal bleeding)     Patient Active Problem List   Diagnosis Date Noted  . Loose stools 10/25/2016  . Menopause 10/25/2016  . Chronic constipation 12/10/2014  . 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  . Severe mental retardation 12/10/2014    Past Surgical History:  Procedure Laterality Date  . HYSTEROSCOPY W/D&C  2013   b9 endometrium wiht polyps    Prior to Admission medications   Medication Sig Start Date End Date Taking? Authorizing Provider  acetaminophen (TYLENOL) 325 MG tablet Take 650 mg by mouth every 6 (six) hours as needed.    [provider]  aluminum-magnesium hydroxide-simethicone (MAALOX) 200-200-20 MG/5ML SUSP Take 30 mLs by mouth 4 (four) times daily -  before meals and at  bedtime.    [provider]  bacitracin-polymyxin b (POLYSPORIN) ointment Apply topically 2 (two) times daily.    [provider]  bisacodyl (DULCOLAX) 10 MG suppository Place 10 mg rectally as needed for moderate constipation.    [provider]  Calcium Citrate-Vitamin D (CVS CALCIUM CITRATE +D3 MINI) 200-250 MG-UNIT TABS Take by mouth. 02/01/15   [provider]  carbamide peroxide (DEBROX) 6.5 % otic solution 5 drops 2 (two) times daily.    [provider]  cetirizine (ZYRTEC) 10 MG tablet Take 10 mg by mouth daily.    [provider]  diazepam (VALIUM) 5 MG tablet Take 5 mg by mouth every 6 (six) hours as needed for anxiety.    [provider]  divalproex (DEPAKOTE ER) 500 MG 24 hr tablet Take 500 mg by mouth 2 (two) times daily.    [provider]  DULoxetine (CYMBALTA) 30 MG capsule Take 30 mg by mouth daily.    [provider]  furosemide (LASIX) 20 MG tablet Take by mouth.    [provider]  guaifenesin (ROBITUSSIN) 100 MG/5ML syrup Take 200 mg by mouth 3 (three) times daily as needed for cough.    [provider]  hydrOXYzine (ATARAX/VISTARIL) 25 MG tablet Take 25 mg by mouth 3 (three) times daily as needed.    [provider]  lanolin-mineral oil Lorina Rabon) LOTN Apply topically as needed.    [provider]  loperamide (IMODIUM) 2 MG capsule Take by mouth as needed for diarrhea or loose stools.    [provider]  magnesium hydroxide (MILK OF MAGNESIA) 800 MG/5ML suspension Take by mouth daily as needed for constipation.    [provider]  Multiple Vitamins-Minerals (CERTAGEN SILVER PO) Take by mouth.    [provider]  potassium chloride (K-DUR) 10 MEQ tablet Take by mouth.    [provider]  senna (SENNA-TIME) 8.6 MG tablet TAKE 2 TABLETS BY MOUTH TWO TIMES A DAY. 09/26/16   [provider]  sulfamethoxazole-trimethoprim  (BACTRIM DS,SEPTRA DS) 800-160 MG tablet Take 1 tablet by mouth 2 (two) times daily. 11/06/17   Defrancesco, Prentice Docker, MD  tolnaftate (TINACTIN) 1 % powder Apply 1 application topically 2 (two) times daily.    [provider]    Allergies Patient has no known allergies.  Family History  Family history unknown: Yes    Social History Social History   Tobacco Use  . Smoking status: Never Smoker  . Smokeless tobacco: Never Used  Substance Use Topics  . Alcohol use: No  . Drug use: No     Review of Systems provided by caretaker.  Patient nonverbal. Constitutional: No fever/chills Eyes: No visual changes.  Respiratory: no cough. No SOB. Gastrointestinal: No abdominal pain.  No nausea, no vomiting.  No diarrhea.  No constipation. Musculoskeletal: Positive for edema, ecchymosis to left hand Skin: No other visible signs of trauma with abrasions, lacerations, ecchymosis. Neurological: No change from baseline 10-point ROS otherwise negative.  ____________________________________________   PHYSICAL EXAM:  VITAL SIGNS: ED Triage Vitals [09/22/18 2016]  Enc Vitals Group     BP      Pulse      Resp      Temp      Temp src      SpO2      Weight 130 lb (59 kg)     Height 5' (1.524 m)     Head Circumference      Peak Flow      Pain Score      Pain Loc      Pain Edu?      Excl. in GC?      Constitutional: Alert and oriented. Well appearing and in no acute distress. Eyes: Conjunctivae are normal. PERRL. EOMI. Head: Atraumatic. Neck: No stridor.    Cardiovascular: Normal rate, regular rhythm. Normal S1 and S2.  Good peripheral circulation. Respiratory: Normal respiratory effort without tachypnea or retractions. Lungs CTAB. Good air entry to the bases with no decreased or absent breath sounds. Musculoskeletal: Full range of motion to all extremities. No gross deformities appreciated.  Visualization of the left hand reveals edema, ecchymosis to mid hand through all 5  digits.  Edema and ecchymosis is most appreciable to the third digit however.  Patient has all 5 fingers flexed.  She will extend them intermittently.  No gross visible deformity.  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  Capillary refill intact all 5 digits.  Patient is nonverbal and unable to determine whether patient has sensation to all 5 digits. Neurologic:  Normal speech and language. No gross focal neurologic deficits are appreciated.  Skin:  Skin is warm, dry and intact. No rash noted. Psychiatric: Mood and affect are normal. Speech and behavior are normal. Patient exhibits appropriate insight and judgement.   ____________________________________________   LABS (all labs ordered are listed, but only abnormal results are displayed)  Labs Reviewed - No data to display ____________________________________________  EKG   ____________________________________________  RADIOLOGY I personally viewed and evaluated these images as part of my medical decision  making, as well as reviewing the written report by the radiologist.  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  However, no acute osseous findings.  Dg Hand Complete Left  Result Date: 09/22/2018 CLINICAL DATA:  Left index and middle finger pain and bruising. No witnessed injury. EXAM: LEFT HAND - COMPLETE 3+ VIEW COMPARISON:  None. FINDINGS: The examination is limited by limited ability of the patient to cooperate for positioning. No fractures or dislocations are seen. IMPRESSION: Limited examination with no visible fracture or dislocation. Electronically Signed   By: Beckie Salts M.D.   On: 09/22/2018 23:19    ____________________________________________    PROCEDURES  Procedure(s) performed:    Procedures    Medications  LORazepam (ATIVAN) injection 2 mg (2 mg Intramuscular Given 09/22/18 2240)      ____________________________________________   INITIAL IMPRESSION / ASSESSMENT AND PLAN / ED COURSE  Pertinent labs & imaging results that were available during my care of the patient were reviewed by me and considered in my medical decision making (see chart for details).  Review of the [place] CSRS was performed in accordance of the [facility] prior to dispensing any controlled drugs.      Patient's diagnosis is consistent with contusion of the right hand.  Patient presents emergency department with edema, ecchymosis of the left hand.  Patient with severe mental retardation.  She is nonverbal.  Posey Rea whether this was injury or type of injury.  Patient typically requires antianxiety medications to allow procedures or detailed examinations.  Patient was given IM Ativan which allowed for imaging.  Imaging is limited by positioning, however no acute osseous findings.  Patient would be more agitated with splint placement, so no splint is placed.  Tylenol and Motrin at home if needed for any signs of pain.  Currently, patient appears to be unbothered by hand injury.  Follow-up with primary care as needed.. Patient is given ED precautions to return to the ED for any worsening or new symptoms.     ____________________________________________  FINAL CLINICAL IMPRESSION(S) / ED DIAGNOSES  Final diagnoses:  Contusion of right hand, initial encounter      NEW MEDICATIONS STARTED DURING THIS VISIT:  ED Discharge Orders    None          This chart was dictated using voice recognition software/Dragon. Despite best efforts to proofread, errors can occur which can change the meaning. Any change was purely unintentional.    Racheal Patches, PA-C 09/22/18 2348    Rockne Menghini, MD 09/23/18 [phone]

## 2018-09-22 NOTE — ED Notes (Signed)
Patient refused to allow me to get her vitals.

## 2018-09-22 NOTE — ED Triage Notes (Signed)
Pt caregiver reports that pt has bruising to left hand with swelling that was noted during bath today - no injury noted

## 2018-09-22 NOTE — Discharge Instructions (Signed)
Likely hand contusion from either a fall, other injury such as shutting a door on her hand.  Patient may receive Tylenol 650 mg every 6-8 hours as needed for pain.  Patient may also receive 600 mg ibuprofen every 6-8 hours as needed for pain.  If patient does not appear to be bothered, no medication needed to be administered.  Follow-up with primary care as needed.

## 2018-09-23 NOTE — ED Notes (Signed)
Discharge instructions reviewed and given by Jonathan, PA with patient.  Signature pad not working, no further questions from patient full understanding of discharge. 

## 2018-11-05 ENCOUNTER — Encounter: Payer: Self-pay | Admitting: Obstetrics and Gynecology

## 2018-11-05 ENCOUNTER — Ambulatory Visit (INDEPENDENT_AMBULATORY_CARE_PROVIDER_SITE_OTHER): Payer: Medicare Other | Admitting: Obstetrics and Gynecology

## 2018-11-05 ENCOUNTER — Encounter: Payer: Medicare Other | Admitting: Obstetrics and Gynecology

## 2018-11-05 VITALS — BP 107/76 | HR 73 | Ht 60.0 in

## 2018-11-05 DIAGNOSIS — Z01419 Encounter for gynecological examination (general) (routine) without abnormal findings: Secondary | ICD-10-CM

## 2018-11-05 DIAGNOSIS — Z0000 Encounter for general adult medical examination without abnormal findings: Secondary | ICD-10-CM

## 2018-11-05 NOTE — Progress Notes (Signed)
HPI:      Sabrina Johnston is a 63 y.o. G0P0000 who LMP was No LMP recorded. Patient is postmenopausal.  Subjective:   She presents today for her annual examination.  She currently lives in a group home because she is mentally challenged.  She refuses pelvic examinations and from the report refuses mammography. Her caregivers state that she is not having any further issues with vaginal irritation bleeding or discharge.    Hx: The following portions of the patient's history were reviewed and updated as appropriate:             She  has a past medical history of Menopause, Mental retardation, and PMB (postmenopausal bleeding). She does not have any pertinent problems on file. She  has a past surgical history that includes Hysteroscopy w/D&C (2013). Her Family history is unknown by patient. She  reports that she has never smoked. She has never used smokeless tobacco. She reports that she does not drink alcohol or use drugs. She has a current medication list which includes the following prescription(s): acetaminophen, aluminum-magnesium hydroxide-simethicone, bacitracin-polymyxin b, bisacodyl, calcium citrate-vitamin d, carbamide peroxide, cetirizine, diazepam, divalproex, duloxetine, furosemide, guaifenesin, hydroxyzine, lanolin-mineral oil, loperamide, magnesium hydroxide, multiple vitamins-minerals, potassium chloride, senna, sulfamethoxazole-trimethoprim, and tolnaftate. She has No Known Allergies.       Review of Systems:  Review of Systems  Constitutional: Denied constitutional symptoms, night sweats, recent illness, fatigue, fever, insomnia and weight loss.  Eyes: Denied eye symptoms, eye pain, photophobia, vision change and visual disturbance.  Ears/Nose/Throat/Neck: Denied ear, nose, throat or neck symptoms, hearing loss, nasal discharge, sinus congestion and sore throat.  Cardiovascular: Denied cardiovascular symptoms, arrhythmia, chest pain/pressure, edema, exercise intolerance,  orthopnea and palpitations.  Respiratory: Denied pulmonary symptoms, asthma, pleuritic pain, productive sputum, cough, dyspnea and wheezing.  Gastrointestinal: Denied, gastro-esophageal reflux, melena, nausea and vomiting.  Genitourinary: Denied genitourinary symptoms including symptomatic vaginal discharge, pelvic relaxation issues, and urinary complaints.  Musculoskeletal: Denied musculoskeletal symptoms, stiffness, swelling, muscle weakness and myalgia.  Dermatologic: Denied dermatology symptoms, rash and scar.  Neurologic: Denied neurology symptoms, dizziness, headache, neck pain and syncope.  Psychiatric: Denied psychiatric symptoms, anxiety and depression.  Endocrine: Denied endocrine symptoms including hot flashes and night sweats.   Meds:   Current Outpatient Medications on File Prior to Visit  Medication Sig Dispense Refill  . acetaminophen (TYLENOL) 325 MG tablet Take 650 mg by mouth every 6 (six) hours as needed.    Marland Kitchen aluminum-magnesium hydroxide-simethicone (MAALOX) 200-200-20 MG/5ML SUSP Take 30 mLs by mouth 4 (four) times daily -  before meals and at bedtime.    . bacitracin-polymyxin b (POLYSPORIN) ointment Apply topically 2 (two) times daily.    . bisacodyl (DULCOLAX) 10 MG suppository Place 10 mg rectally as needed for moderate constipation.    . Calcium Citrate-Vitamin D (CVS CALCIUM CITRATE +D3 MINI) 200-250 MG-UNIT TABS Take by mouth.    . carbamide peroxide (DEBROX) 6.5 % otic solution 5 drops 2 (two) times daily.    . cetirizine (ZYRTEC) 10 MG tablet Take 10 mg by mouth daily.    . diazepam (VALIUM) 5 MG tablet Take 5 mg by mouth every 6 (six) hours as needed for anxiety.    . divalproex (DEPAKOTE ER) 500 MG 24 hr tablet Take 500 mg by mouth 2 (two) times daily.    . DULoxetine (CYMBALTA) 30 MG capsule Take 30 mg by mouth daily.    . furosemide (LASIX) 20 MG tablet Take by mouth.    . guaifenesin (  ROBITUSSIN) 100 MG/5ML syrup Take 200 mg by mouth 3 (three) times daily  as needed for cough.    . hydrOXYzine (ATARAX/VISTARIL) 25 MG tablet Take 25 mg by mouth 3 (three) times daily as needed.    . lanolin-mineral oil (KERI) LOTN Apply topically as needed.    . loperamide (IMODIUM) 2 MG capsule Take by mouth as needed for diarrhea or loose stools.    . magnesium hydroxide (MILK OF MAGNESIA) 800 MG/5ML suspension Take by mouth daily as needed for constipation.    . Multiple Vitamins-Minerals (CERTAGEN SILVER PO) Take by mouth.    . potassium chloride (K-DUR) 10 MEQ tablet Take by mouth.    . senna (SENNA-TIME) 8.6 MG tablet TAKE 2 TABLETS BY MOUTH TWO TIMES A DAY.    . sulfamethoxazole-trimethoprim (BACTRIM DS,SEPTRA DS) 800-160 MG tablet Take 1 tablet by mouth 2 (two) times daily. 14 tablet 0  . tolnaftate (TINACTIN) 1 % powder Apply 1 application topically 2 (two) times daily.     No current facility-administered medications on file prior to visit.     Objective:     Vitals:   11/05/18 1004  BP: 107/76  Pulse: 73              Breast exam -symmetric, no axillary adenopathy, no breast discharge no masses noted.   Patient noncompliant with pelvic exam.  Assessment:    G0P0000 Patient Active Problem List   Diagnosis Date Noted  . Loose stools 10/25/2016  . Menopause 10/25/2016  . Chronic constipation 12/10/2014  . 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  . Severe mental retardation 12/10/2014     1. Well woman exam     Per report of caregivers she is not experiencing any problems or unusual physical issues.  Unable to perform pelvic exam including Pap smear.  Breast exam   Plan:            1.  Patient unable to comply with pelvic exam or mammography.  In a nonsexually active postmenopausal   woman, Pap smear is a low priority compared to the difficulty and stress the patient must go through to   accomplish it   2.  Continue with calcium and vitamin D supplementation.  3.  Follow up as needed for any gynecologic issues including vaginal  discharge, vaginal bleeding, or    perineal symptoms suggestive of pathology Orders No orders of the defined types were placed in this encounter.   No orders of the defined types were placed in this encounter.     F/U  Return in about 1 year (around 11/06/2019) for Annual Physical.  Elonda Husky, M.D. 11/05/2018 10:38 AM

## 2018-11-05 NOTE — Progress Notes (Signed)
Patient comes in for annual physical. No problems or concerns. She does not have mammograms. Pap due 2021.

## 2019-11-10 ENCOUNTER — Encounter: Payer: Medicare Other | Admitting: Obstetrics and Gynecology

## 2020-06-09 ENCOUNTER — Encounter: Payer: Medicare Other | Admitting: Obstetrics and Gynecology

## 2020-06-22 ENCOUNTER — Encounter: Payer: Self-pay | Admitting: Obstetrics and Gynecology

## 2020-06-22 ENCOUNTER — Other Ambulatory Visit: Payer: Self-pay

## 2020-06-22 ENCOUNTER — Ambulatory Visit (INDEPENDENT_AMBULATORY_CARE_PROVIDER_SITE_OTHER): Payer: Medicare Other | Admitting: Obstetrics and Gynecology

## 2020-06-22 DIAGNOSIS — Z0000 Encounter for general adult medical examination without abnormal findings: Secondary | ICD-10-CM

## 2020-06-22 DIAGNOSIS — Z01419 Encounter for gynecological examination (general) (routine) without abnormal findings: Secondary | ICD-10-CM

## 2020-06-22 NOTE — Progress Notes (Signed)
HPI:      [name] is a 64 y.o. G0P0000 who LMP was No LMP recorded. Patient is postmenopausal.  Subjective:   She presents today for her annual examination.  The patient is mentally challenged and lives in a group home.  She is uncooperative today would not submit to examination. According to her caregiver who has accompanied her today she is having no issues with vaginal bleeding rash or other pelvic issues.  She wears pull-ups and occasionally has incontinence.    Hx: The following portions of the patient's history were reviewed and updated as appropriate:             She  has a past medical history of Menopause, Mental retardation, and PMB (postmenopausal bleeding). She does not have any pertinent problems on file. She  has a past surgical history that includes Hysteroscopy with [facility] (2013). Her Family history is unknown by patient. She  reports that she has never smoked. She has never used smokeless tobacco. She reports that she does not drink alcohol and does not use drugs. She has a current medication list which includes the following prescription(s): acetaminophen, aluminum-magnesium hydroxide-simethicone, bacitracin-polymyxin b, bisacodyl, calcium citrate-vitamin d, carbamide peroxide, cetirizine, diazepam, divalproex, duloxetine, furosemide, guaifenesin, hydroxyzine, lanolin-mineral oil, loperamide, magnesium hydroxide, multiple vitamins-minerals, potassium chloride, senna, sulfamethoxazole-trimethoprim, and tolnaftate. She has No Known Allergies.       Review of Systems:  Review of Systems  Constitutional: Denied constitutional symptoms, night sweats, recent illness, fatigue, fever, insomnia and weight loss.  Eyes: Denied eye symptoms, eye pain, photophobia, vision change and visual disturbance.  Ears/Nose/Throat/Neck: Denied ear, nose, throat or neck symptoms, hearing loss, nasal discharge, sinus congestion and sore throat.  Cardiovascular: Denied cardiovascular  symptoms, arrhythmia, chest pain/pressure, edema, exercise intolerance, orthopnea and palpitations.  Respiratory: Denied pulmonary symptoms, asthma, pleuritic pain, productive sputum, cough, dyspnea and wheezing.  Gastrointestinal: Denied, gastro-esophageal reflux, melena, nausea and vomiting.  Genitourinary: Denied genitourinary symptoms including symptomatic vaginal discharge, pelvic relaxation issues, and urinary complaints.  Musculoskeletal: Denied musculoskeletal symptoms, stiffness, swelling, muscle weakness and myalgia.  Dermatologic: Denied dermatology symptoms, rash and scar.  Neurologic: Denied neurology symptoms, dizziness, headache, neck pain and syncope.  Psychiatric: Denied psychiatric symptoms, anxiety and depression.  Endocrine: Denied endocrine symptoms including hot flashes and night sweats.   Meds:   Current Outpatient Medications on File Prior to Visit  Medication Sig Dispense Refill  . acetaminophen (TYLENOL) 325 MG tablet Take 650 mg by mouth every 6 (six) hours as needed.    Marland Kitchen aluminum-magnesium hydroxide-simethicone (MAALOX) 200-200-20 MG/5ML SUSP Take 30 mLs by mouth 4 (four) times daily -  before meals and at bedtime.    . bacitracin-polymyxin b (POLYSPORIN) ointment Apply topically 2 (two) times daily.    . bisacodyl (DULCOLAX) 10 MG suppository Place 10 mg rectally as needed for moderate constipation.    . Calcium Citrate-Vitamin D ([employer] CALCIUM CITRATE +D3 MINI) 200-250 MG-UNIT TABS Take by mouth.    . carbamide peroxide (DEBROX) 6.5 % otic solution 5 drops 2 (two) times daily.    . cetirizine (ZYRTEC) 10 MG tablet Take 10 mg by mouth daily.    . diazepam (VALIUM) 5 MG tablet Take 5 mg by mouth every 6 (six) hours as needed for anxiety.    . divalproex (DEPAKOTE ER) 500 MG 24 hr tablet Take 500 mg by mouth 2 (two) times daily.    . DULoxetine (CYMBALTA) 30 MG capsule Take 30 mg by mouth daily.    Marland Kitchen  furosemide (LASIX) 20 MG tablet Take by mouth.    . guaifenesin  (ROBITUSSIN) 100 MG/5ML syrup Take 200 mg by mouth 3 (three) times daily as needed for cough.    . hydrOXYzine (ATARAX/VISTARIL) 25 MG tablet Take 25 mg by mouth 3 (three) times daily as needed.    . lanolin-mineral oil (KERI) LOTN Apply topically as needed.    . loperamide (IMODIUM) 2 MG capsule Take by mouth as needed for diarrhea or loose stools.    . magnesium hydroxide (MILK OF MAGNESIA) 800 MG/5ML suspension Take by mouth daily as needed for constipation.    . Multiple Vitamins-Minerals (CERTAGEN SILVER PO) Take by mouth.    . potassium chloride (K-DUR) 10 MEQ tablet Take by mouth.    . senna (SENNA-TIME) 8.6 MG tablet TAKE 2 TABLETS BY MOUTH TWO TIMES A DAY.    . sulfamethoxazole-trimethoprim (BACTRIM DS,SEPTRA DS) 800-160 MG tablet Take 1 tablet by mouth 2 (two) times daily. 14 tablet 0  . tolnaftate (TINACTIN) 1 % powder Apply 1 application topically 2 (two) times daily.     No current facility-administered medications on file prior to visit.    Objective:     There were no vitals filed for this visit.            Unable to perform examination because of patient's noncompliance.  Assessment:    G0P0000 Patient Active Problem List   Diagnosis Date Noted  . Loose stools 10/25/2016  . Menopause 10/25/2016  . Chronic constipation 12/10/2014  . 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  . Severe mental retardation 12/10/2014     1. Well woman exam with routine gynecological exam     As patient is not sexually active she is low risk for cervical cancer.  Pelvic examination cannot be obtained.  As she is having no bleeding or other vulvar vaginal issues we will forego examination for today.  Her caregiver states that she does get mammograms but requires several people to accomplish this.   Plan:            1.  Recommend annual mammograms.  2.  Possibly plan for additional caregivers next year so that the patient can be effectively undressed and at least externally  examined. Orders No orders of the defined types were placed in this encounter.   No orders of the defined types were placed in this encounter.           F/U  Return in about 1 year (around 06/22/2021) for Annual Physical.  Elonda Husky, M.D. 06/22/2020 12:07 PM

## 2020-06-22 NOTE — Progress Notes (Signed)
Unable to take vitals- patient is uncooperative.

## 2021-05-26 ENCOUNTER — Emergency Department: Payer: Medicare Other

## 2021-05-26 ENCOUNTER — Inpatient Hospital Stay
Admission: EM | Admit: 2021-05-26 | Discharge: 2021-06-01 | DRG: 871 | Disposition: A | Discharge status: Other Acute Inpatient Hospital | Payer: Medicare Other | Attending: Student | Admitting: Student

## 2021-05-26 ENCOUNTER — Other Ambulatory Visit: Payer: Self-pay

## 2021-05-26 DIAGNOSIS — Z9152 Personal history of nonsuicidal self-harm: Secondary | ICD-10-CM

## 2021-05-26 DIAGNOSIS — E876 Hypokalemia: Secondary | ICD-10-CM | POA: Diagnosis present

## 2021-05-26 DIAGNOSIS — A4151 Sepsis due to Escherichia coli [E. coli]: Principal | ICD-10-CM | POA: Diagnosis present

## 2021-05-26 DIAGNOSIS — Q048 Other specified congenital malformations of brain: Secondary | ICD-10-CM

## 2021-05-26 DIAGNOSIS — R7881 Bacteremia: Secondary | ICD-10-CM

## 2021-05-26 DIAGNOSIS — E860 Dehydration: Secondary | ICD-10-CM | POA: Diagnosis not present

## 2021-05-26 DIAGNOSIS — R296 Repeated falls: Secondary | ICD-10-CM | POA: Diagnosis present

## 2021-05-26 DIAGNOSIS — K5909 Other constipation: Secondary | ICD-10-CM | POA: Diagnosis present

## 2021-05-26 DIAGNOSIS — G40909 Epilepsy, unspecified, not intractable, without status epilepticus: Secondary | ICD-10-CM

## 2021-05-26 DIAGNOSIS — G9341 Metabolic encephalopathy: Secondary | ICD-10-CM | POA: Diagnosis present

## 2021-05-26 DIAGNOSIS — R946 Abnormal results of thyroid function studies: Secondary | ICD-10-CM | POA: Diagnosis present

## 2021-05-26 DIAGNOSIS — N390 Urinary tract infection, site not specified: Secondary | ICD-10-CM | POA: Diagnosis present

## 2021-05-26 DIAGNOSIS — G40409 Other generalized epilepsy and epileptic syndromes, not intractable, without status epilepticus: Secondary | ICD-10-CM | POA: Diagnosis present

## 2021-05-26 DIAGNOSIS — Z8673 Personal history of transient ischemic attack (TIA), and cerebral infarction without residual deficits: Secondary | ICD-10-CM

## 2021-05-26 DIAGNOSIS — Z79899 Other long term (current) drug therapy: Secondary | ICD-10-CM

## 2021-05-26 DIAGNOSIS — R4182 Altered mental status, unspecified: Secondary | ICD-10-CM

## 2021-05-26 DIAGNOSIS — F72 Severe intellectual disabilities: Secondary | ICD-10-CM | POA: Diagnosis present

## 2021-05-26 DIAGNOSIS — Z20822 Contact with and (suspected) exposure to covid-19: Secondary | ICD-10-CM | POA: Diagnosis present

## 2021-05-26 DIAGNOSIS — I959 Hypotension, unspecified: Secondary | ICD-10-CM | POA: Diagnosis present

## 2021-05-26 DIAGNOSIS — Z1611 Resistance to penicillins: Secondary | ICD-10-CM | POA: Diagnosis present

## 2021-05-26 DIAGNOSIS — R0902 Hypoxemia: Secondary | ICD-10-CM | POA: Diagnosis present

## 2021-05-26 DIAGNOSIS — G35 Multiple sclerosis: Secondary | ICD-10-CM | POA: Diagnosis present

## 2021-05-26 DIAGNOSIS — R630 Anorexia: Secondary | ICD-10-CM | POA: Diagnosis present

## 2021-05-26 LAB — URINALYSIS, COMPLETE (UACMP) WITH MICROSCOPIC
Bacteria, UA: NONE SEEN
Bilirubin Urine: NEGATIVE
Glucose, UA: NEGATIVE mg/dL
Hgb urine dipstick: NEGATIVE
Ketones, ur: 5 mg/dL — AB
Nitrite: NEGATIVE
Protein, ur: NEGATIVE mg/dL
Specific Gravity, Urine: 1.013 (ref 1.005–1.030)
Squamous Epithelial / HPF: NONE SEEN (ref 0–5)
WBC, UA: NONE SEEN WBC/hpf (ref 0–5)
pH: 9 — ABNORMAL HIGH (ref 5.0–8.0)

## 2021-05-26 LAB — COMPREHENSIVE METABOLIC PANEL
ALT: 18 U/L (ref 0–44)
AST: 39 U/L (ref 15–41)
Albumin: 2.6 g/dL — ABNORMAL LOW (ref 3.5–5.0)
Alkaline Phosphatase: 87 U/L (ref 38–126)
Anion gap: 12 (ref 5–15)
BUN: 20 mg/dL (ref 8–23)
CO2: 25 mmol/L (ref 22–32)
Calcium: 9.2 mg/dL (ref 8.9–10.3)
Chloride: 99 mmol/L (ref 98–111)
Creatinine, Ser: 1.06 mg/dL — ABNORMAL HIGH (ref 0.44–1.00)
GFR, Estimated: 59 mL/min — ABNORMAL LOW (ref >60)
Glucose, Bld: 145 mg/dL — ABNORMAL HIGH (ref 70–99)
Potassium: 5 mmol/L (ref 3.5–5.1)
Sodium: 136 mmol/L (ref 135–145)
Total Bilirubin: 0.7 mg/dL (ref 0.3–1.2)
Total Protein: 6.4 g/dL — ABNORMAL LOW (ref 6.5–8.1)

## 2021-05-26 LAB — TSH: TSH: 5.283 u[IU]/mL — ABNORMAL HIGH (ref 0.350–4.500)

## 2021-05-26 LAB — CBC WITH DIFFERENTIAL/PLATELET
Abs Immature Granulocytes: 0.1 10*3/uL — ABNORMAL HIGH (ref 0.00–0.07)
Basophils Absolute: 0 10*3/uL (ref 0.0–0.1)
Basophils Relative: 0 %
Eosinophils Absolute: 0 10*3/uL (ref 0.0–0.5)
Eosinophils Relative: 0 %
HCT: 38.3 % (ref 36.0–46.0)
Hemoglobin: 13.2 g/dL (ref 12.0–15.0)
Immature Granulocytes: 1 %
Lymphocytes Relative: 10 %
Lymphs Abs: 1.3 10*3/uL (ref 0.7–4.0)
MCH: 37.9 pg — ABNORMAL HIGH (ref 26.0–34.0)
MCHC: 34.5 g/dL (ref 30.0–36.0)
MCV: 110.1 fL — ABNORMAL HIGH (ref 80.0–100.0)
Monocytes Absolute: 3 10*3/uL — ABNORMAL HIGH (ref 0.1–1.0)
Monocytes Relative: 23 %
Neutro Abs: 8.7 10*3/uL — ABNORMAL HIGH (ref 1.7–7.7)
Neutrophils Relative %: 66 %
Platelets: 210 10*3/uL (ref 150–400)
RBC: 3.48 MIL/uL — ABNORMAL LOW (ref 3.87–5.11)
RDW: 13.9 % (ref 11.5–15.5)
Smear Review: NORMAL
WBC: 13.1 10*3/uL — ABNORMAL HIGH (ref 4.0–10.5)
nRBC: 0.2 % (ref 0.0–0.2)

## 2021-05-26 LAB — RESP PANEL BY RT-PCR (FLU A&B, COVID) ARPGX2
Influenza A by PCR: NEGATIVE
Influenza B by PCR: NEGATIVE
SARS Coronavirus 2 by RT PCR: NEGATIVE

## 2021-05-26 LAB — AMMONIA: Ammonia: 18 umol/L (ref 9–35)

## 2021-05-26 LAB — PROCALCITONIN: Procalcitonin: 0.38 ng/mL

## 2021-05-26 MED ORDER — LACTATED RINGERS IV BOLUS
1000.0000 mL | Freq: Once | INTRAVENOUS | Status: AC
  Administered 2021-05-27: 1000 mL via INTRAVENOUS

## 2021-05-26 MED ORDER — LORAZEPAM 2 MG/ML IJ SOLN
INTRAMUSCULAR | Status: AC
  Administered 2021-05-26: 2 mg via INTRAVENOUS
  Filled 2021-05-26: qty 1

## 2021-05-26 MED ORDER — LACTATED RINGERS IV BOLUS
500.0000 mL | Freq: Once | INTRAVENOUS | Status: AC
  Administered 2021-05-26: 500 mL via INTRAVENOUS

## 2021-05-26 MED ORDER — LACTATED RINGERS IV BOLUS: 1000.0000 mL | Freq: Once | INTRAVENOUS | Status: DC

## 2021-05-26 NOTE — ED Triage Notes (Signed)
C/o decreased fluid and food intake with weakness toward right side, and dark malodorous urine. Denies fevers.

## 2021-05-26 NOTE — ED Notes (Signed)
Pt. In CT with NT, Corrie Dandy.

## 2021-05-26 NOTE — ED Provider Notes (Signed)
[facility] Emergency Department Provider Note  ____________________________________________   Event Date/Time   First MD Initiated Contact with Patient 05/26/21 2006     (approximate)  I have reviewed the triage vital signs and the nursing notes.   HISTORY  Chief Complaint Weakness (C/o decreased fluid and food intake with weakness toward right side, and dark malodorous urine. Denies fevers.)   HPI Sabrina Johnston is a 65 y.o. 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  Patient is unable provide any history on arrival secondary to baseline nonverbal status.  Per staff at group home I was able to speak with patient's had a couple falls for the last couple days and today seem to be leaning more to her right side.  She is not on any blood thinners.  She has a history of self injures behavior but seem to have been hitting herself in the face more than usual last couple days.  She has some bruising on her face and left ear she sustained today.  She is also been eating and drinking less the last couple of days.  She has not had any cough, vomiting, fevers or diarrhea as far as staff is aware.  No other history is Ameeth available patient presentation         Past Medical History:  Diagnosis Date   Menopause    Mental retardation    PMB (postmenopausal bleeding)     Patient Active Problem List   Diagnosis Date Noted   Loose stools 10/25/2016   Menopause 10/25/2016   Chronic constipation 12/10/2014   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   Severe mental retardation 12/10/2014    Past  Surgical History:  Procedure Laterality Date   HYSTEROSCOPY WITH D & C  2013   b9 endometrium wiht polyps    Prior to Admission medications   Medication Sig Start Date End Date Taking? Authorizing Provider  acetaminophen (TYLENOL) 325 MG tablet Take 650 mg by mouth every 6 (six) hours as needed.    [provider]  aluminum-magnesium hydroxide-simethicone (MAALOX) 200-200-20 MG/5ML SUSP Take 30 mLs by mouth 4 (four) times daily -  before meals and at bedtime.    [provider]  bacitracin-polymyxin b (POLYSPORIN) ointment Apply topically 2 (two) times daily.    [provider]  bisacodyl (DULCOLAX) 10 MG suppository Place 10 mg rectally as needed for moderate constipation.    [provider]  Calcium Citrate-Vitamin D ([employer] CALCIUM CITRATE +D3 MINI) 200-250 MG-UNIT TABS Take by mouth. 02/01/15   [provider]  carbamide peroxide (DEBROX) 6.5 % otic solution 5 drops 2 (two) times daily.    [provider]  cetirizine (ZYRTEC) 10 MG tablet Take 10 mg by mouth daily.    [provider]  diazepam (VALIUM) 5 MG tablet Take 5 mg by mouth every 6 (six) hours as needed for anxiety.    [provider]  divalproex (DEPAKOTE ER) 500 MG 24 hr tablet Take 500 mg by mouth 2 (two) times daily.    [provider]  DULoxetine (CYMBALTA) 30 MG capsule Take 30 mg by mouth daily.    [provider]  furosemide (LASIX) 20 MG tablet Take by mouth.    [provider]  guaifenesin (ROBITUSSIN) 100 MG/5ML syrup Take 200 mg by mouth 3 (three) times daily as needed for cough.    [provider]  hydrOXYzine (ATARAX/VISTARIL) 25 MG tablet Take 25 mg by mouth 3 (three) times daily as needed.    [provider]  lanolin-mineral oil Lorina Rabon) LOTN Apply topically as needed.    [provider]  loperamide (IMODIUM) 2 MG capsule Take by mouth as needed for diarrhea or loose stools.    [provider]  magnesium hydroxide (MILK OF MAGNESIA) 800 MG/5ML suspension Take by mouth daily as needed for constipation.    [provider]  Multiple Vitamins-Minerals (CERTAGEN SILVER PO) Take by mouth.    [provider]  potassium chloride (K-DUR) 10 MEQ tablet Take by mouth.    [provider]  senna (SENNA-TIME) 8.6 MG tablet TAKE 2 TABLETS BY MOUTH TWO TIMES A DAY. 09/26/16   [provider]  sulfamethoxazole-trimethoprim (BACTRIM DS,SEPTRA DS) 800-160 MG tablet Take 1 tablet by mouth 2 (two) times daily. 11/06/17   Defrancesco, Prentice Docker, MD  tolnaftate (TINACTIN) 1 % powder Apply 1 application topically 2 (two) times daily.    [provider]    Allergies Patient has no known allergies.  Family History  Family history unknown: Yes    Social History Social History   Tobacco Use   Smoking status: Never   Smokeless tobacco: Never  Vaping Use   Vaping Use: Never used  Substance Use Topics   Alcohol use: No   Drug use: No    Review of Systems  Review of Systems  Unable to perform ROS: Patient nonverbal     ____________________________________________   PHYSICAL EXAM:  VITAL SIGNS: ED Triage Vitals  Enc Vitals Group     BP      Pulse      Resp      Temp      Temp src      SpO2      Weight      Height      Head Circumference      Peak Flow      Pain Score      Pain Loc      Pain Edu?      Excl. in GC?    Vitals:   05/26/21 2230 05/26/21 2245  BP: 90/62   Pulse: 87 88  Resp: (!) 26 (!) 22  Temp:    SpO2: 99% 98%   Physical Exam Vitals and nursing note reviewed.  Constitutional:      General: She is in acute distress.     Appearance: She is well-developed. She is ill-appearing.  HENT:     Head: Normocephalic.     Right Ear: External ear normal.     Nose: Nose normal.  Eyes:     Conjunctiva/sclera: Conjunctivae normal.  Cardiovascular:     Rate and Rhythm: Normal rate and regular rhythm.      Heart sounds: No murmur heard. Pulmonary:     Effort: Pulmonary effort is normal. No respiratory distress.     Breath sounds: Normal breath sounds.  Abdominal:     Palpations: Abdomen is soft.     Tenderness: There is no abdominal tenderness.  Musculoskeletal:     Cervical back: Neck supple.  Skin:    General: Skin is warm and dry.  Neurological:  Mental Status: She is alert.     GCS: GCS eye subscore is 4. GCS verbal subscore is 2. GCS motor subscore is 5.     Motor: Tremor present.    Patient does not follow any commands.  On initial arrival she is rocking back and forth actively attempting to hit herself in the head.  She has bilateral periorbital ecchymosis.  The left ear is erythematous and edematous.  Right ear is unremarkable.  Pupils appear grossly symmetric and reactive to light bilaterally.  Patient does not otherwise participate in any commands.  She appears slightly hypotonic in her right upper extremity and left upper extremity.  No clonus.  No nystagmus. ____________________________________________   LABS (all labs ordered are listed, but only abnormal results are displayed)  Labs Reviewed  CBC WITH DIFFERENTIAL/PLATELET - Abnormal; Notable for the following components:      Result Value   WBC 13.1 (*)    RBC 3.48 (*)    MCV 110.1 (*)    MCH 37.9 (*)    Neutro Abs 8.7 (*)    Monocytes Absolute 3.0 (*)    Abs Immature Granulocytes 0.10 (*)    All other components within normal limits  COMPREHENSIVE METABOLIC PANEL - Abnormal; Notable for the following components:   Glucose, Bld 145 (*)    Creatinine, Ser 1.06 (*)    Total Protein 6.4 (*)    Albumin 2.6 (*)    GFR, Estimated 59 (*)    All other components within normal limits  URINALYSIS, COMPLETE (UACMP) WITH MICROSCOPIC - Abnormal; Notable for the following components:   Color, Urine YELLOW (*)    APPearance CLEAR (*)    pH 9.0 (*)    Ketones, ur 5 (*)    Leukocytes,Ua TRACE (*)    All other components  within normal limits  TSH - Abnormal; Notable for the following components:   TSH 5.283 (*)    All other components within normal limits  RESP PANEL BY RT-PCR (FLU A&B, COVID) ARPGX2  CULTURE, BLOOD (SINGLE)  AMMONIA  PROCALCITONIN  LACTIC ACID, PLASMA  LACTIC ACID, PLASMA  PROTIME-INR  APTT   ____________________________________________  EKG  ____________________________________________  RADIOLOGY  ED MD interpretation: CT head, CT face, and CT C-spine show bilateral chronic parieto-occipital encephalomalacia and ex vacuo ventricular dilation.  There is evidence of remote anterior frontal lobe infarcts and remote lacunar infarct of the right caudate head.  No acute intracranial abnormalities noted.  No acute fractures of the face or skull.  There are some degenerative changes of the C-spine without any acute changes noted.  Chest x-ray has no evidence of pneumonia, rib fracture or pneumothorax.  Pelvis x-ray is unremarkable.  Official radiology report(s): CT HEAD WO CONTRAST ( )  Result Date: 05/26/2021 CLINICAL DATA:  Mental status change right-sided weakness. EXAM: CT HEAD WITHOUT CONTRAST CT MAXILLOFACIAL WITHOUT CONTRAST CT CERVICAL SPINE WITHOUT CONTRAST TECHNIQUE: Multidetector CT imaging of the head, cervical spine, and maxillofacial structures were performed using the standard protocol without intravenous contrast. Multiplanar CT image reconstructions of the cervical spine and maxillofacial structures were also generated. COMPARISON:  None. FINDINGS: CT HEAD FINDINGS Brain: Bilateral parieto-occipital encephalomalacia noted with ex vacuo dilation the ventricles. Remote anterior right frontal lobe infarct present. Remote lacunar infarct is present right caudate head. Basal ganglia are otherwise intact. Mild cerebellar atrophy noted. Hemorrhage or mass lesion is present. No significant extraaxial fluid collection is present. Vascular: No hyperdense vessel or unexpected  calcification. Skull: Calvarium is intact. No focal  lytic or blastic lesions are present. No significant extracranial soft tissue lesion is present. CT MAXILLOFACIAL FINDINGS Osseous: No acute or healing fractures are present. Mandible is intact and located. Patient is edentulous. Orbits: The globes and orbits are within normal limits. Sinuses: The paranasal sinuses and mastoid air cells are clear. Soft tissues: The soft tissues of face are unremarkable. CT CERVICAL SPINE FINDINGS Alignment: No significant listhesis is present. Straightening of the normal cervical lordosis is present. Skull base and vertebrae: Craniocervical junction is within normal limits. Vertebral body heights are maintained. No acute or healing fractures are present. Soft tissues and spinal canal: No prevertebral fluid or swelling. No visible canal hematoma. Disc levels: Uncovertebral and facet disease is present in the cervical spine. Foraminal narrowing is greatest at C3-4, right greater than left. Facet hypertrophy results in moderate left foraminal stenosis at C2-3. Upper chest: The lung apices are clear. Thoracic inlet is within normal limits. IMPRESSION: 1. 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 2. Remote anterior right frontal lobe infarct. 3. 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 4. No acute intracranial abnormality. 5. No acute or healing fractures of the face. 6. Multilevel degenerative changes of the cervical spine without acute fracture or traumatic subluxation. Electronically Signed   By: Marin Robertshristopher  Mattern M.D.   On: 05/26/2021 21:22   CT Cervical Spine Wo Contrast  Result Date: 05/26/2021 CLINICAL DATA:  Mental status change right-sided weakness. EXAM: CT HEAD WITHOUT CONTRAST CT MAXILLOFACIAL WITHOUT CONTRAST CT CERVICAL SPINE WITHOUT CONTRAST TECHNIQUE: Multidetector CT imaging of the head, cervical spine, and maxillofacial structures were performed using  the standard protocol without intravenous contrast. Multiplanar CT image reconstructions of the cervical spine and maxillofacial structures were also generated. COMPARISON:  None. FINDINGS: CT HEAD FINDINGS Brain: Bilateral parieto-occipital encephalomalacia noted with ex vacuo dilation the ventricles. Remote anterior right frontal lobe infarct present. Remote lacunar infarct is present right caudate head. Basal ganglia are otherwise intact. Mild cerebellar atrophy noted. Hemorrhage or mass lesion is present. No significant extraaxial fluid collection is present. Vascular: No hyperdense vessel or unexpected calcification. Skull: Calvarium is intact. No focal lytic or blastic lesions are present. No significant extracranial soft tissue lesion is present. CT MAXILLOFACIAL FINDINGS Osseous: No acute or healing fractures are present. Mandible is intact and located. Patient is edentulous. Orbits: The globes and orbits are within normal limits. Sinuses: The paranasal sinuses and mastoid air cells are clear. Soft tissues: The soft tissues of face are unremarkable. CT CERVICAL SPINE FINDINGS Alignment: No significant listhesis is present. Straightening of the normal cervical lordosis is present. Skull base and vertebrae: Craniocervical junction is within normal limits. Vertebral body heights are maintained. No acute or healing fractures are present. Soft tissues and spinal canal: No prevertebral fluid or swelling. No visible canal hematoma. Disc levels: Uncovertebral and facet disease is present in the cervical spine. Foraminal narrowing is greatest at C3-4, right greater than left. Facet hypertrophy results in moderate left foraminal stenosis at C2-3. Upper chest: The lung apices are clear. Thoracic inlet is within normal limits. IMPRESSION: 1. 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 2. Remote anterior right frontal lobe infarct. 3. Remote lacunar infarct of the right  caudate head. 4. No acute intracranial abnormality. 5. No acute or healing fractures of the face. 6. Multilevel degenerative changes of the cervical spine without acute fracture or traumatic subluxation. Electronically Signed   By: Marin Robertshristopher  Mattern M.D.   On: 05/26/2021 21:22  DG Pelvis Portable  Result Date: 05/26/2021 CLINICAL DATA:  Falls EXAM: PORTABLE PELVIS 1-2 VIEWS COMPARISON:  None. FINDINGS: There is no evidence of pelvic fracture or diastasis. No pelvic bone lesions are seen. IMPRESSION: Negative. Electronically Signed   By: Jasmine Pang M.D.   On: 05/26/2021 22:54   DG Chest Portable 1 View  Result Date: 05/26/2021 CLINICAL DATA:  Fall weakness EXAM: PORTABLE CHEST 1 VIEW COMPARISON:  Report 01/21/2015 FINDINGS: Single-view chest demonstrates no focal opacity or pleural effusion. Normal cardiomediastinal silhouette. No pneumothorax. IMPRESSION: No active disease. Electronically Signed   By: Jasmine Pang M.D.   On: 05/26/2021 22:54   CT Maxillofacial Wo Contrast  Result Date: 05/26/2021 CLINICAL DATA:  Mental status change right-sided weakness. EXAM: CT HEAD WITHOUT CONTRAST CT MAXILLOFACIAL WITHOUT CONTRAST CT CERVICAL SPINE WITHOUT CONTRAST TECHNIQUE: Multidetector CT imaging of the head, cervical spine, and maxillofacial structures were performed using the standard protocol without intravenous contrast. Multiplanar CT image reconstructions of the cervical spine and maxillofacial structures were also generated. COMPARISON:  None. FINDINGS: CT HEAD FINDINGS Brain: Bilateral parieto-occipital encephalomalacia noted with ex vacuo dilation the ventricles. Remote anterior right frontal lobe infarct present. Remote lacunar infarct is present right caudate head. Basal ganglia are otherwise intact. Mild cerebellar atrophy noted. Hemorrhage or mass lesion is present. No significant extraaxial fluid collection is present. Vascular: No hyperdense vessel or unexpected calcification. Skull:  Calvarium is intact. No focal lytic or blastic lesions are present. No significant extracranial soft tissue lesion is present. CT MAXILLOFACIAL FINDINGS Osseous: No acute or healing fractures are present. Mandible is intact and located. Patient is edentulous. Orbits: The globes and orbits are within normal limits. Sinuses: The paranasal sinuses and mastoid air cells are clear. Soft tissues: The soft tissues of face are unremarkable. CT CERVICAL SPINE FINDINGS Alignment: No significant listhesis is present. Straightening of the normal cervical lordosis is present. Skull base and vertebrae: Craniocervical junction is within normal limits. Vertebral body heights are maintained. No acute or healing fractures are present. Soft tissues and spinal canal: No prevertebral fluid or swelling. No visible canal hematoma. Disc levels: Uncovertebral and facet disease is present in the cervical spine. Foraminal narrowing is greatest at C3-4, right greater than left. Facet hypertrophy results in moderate left foraminal stenosis at C2-3. Upper chest: The lung apices are clear. Thoracic inlet is within normal limits. IMPRESSION: 1. 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 2. Remote anterior right frontal lobe infarct. 3. 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 4. No acute intracranial abnormality. 5. No acute or healing fractures of the face. 6. Multilevel degenerative changes of the cervical spine without acute fracture or traumatic subluxation. Electronically Signed   By: Marin Roberts M.D.   On: 05/26/2021 21:22    ____________________________________________   PROCEDURES  Procedure(s) performed (including Critical Care):  Procedures   ____________________________________________   INITIAL IMPRESSION / ASSESSMENT AND PLAN / ED COURSE      Patient presents with above-stated history exam for assessment concerns for decreased appetite and p.o.  intake as well as a fall as well as couple days and patient noted to be leaning to her right side today and inability to walk.  On arrival patient is borderline hypotensive with a BP of 98/47 pulse was obtained on her lower extremity.  Several rechecks was noted systolics in the 90s and diastolics in the 60s.  Patient does not have a significant ecchymosis around the face and some bruising on her left  ear.  No other obvious trauma to the abdomen back or extremities.  She is contracted in her upper extremities.  Differential includes acute intracranial hemorrhage, intracranial trauma, facial fractures, CVA, metabolic derangements, sepsis, with sources including UTI or pneumonia.  No obvious cellulitis or joint infections on exam.  CT head, CT face, and CT C-spine show bilateral chronic parieto-occipital encephalomalacia and ex vacuo ventricular dilation.  There is evidence of remote anterior frontal lobe infarcts and remote lacunar infarct of the right caudate head.  No acute intracranial abnormalities noted.  No acute fractures of the face or skull.  There are some degenerative changes of the C-spine without any acute changes noted.  Chest x-ray has no evidence of pneumonia, rib fracture or pneumothorax.  Pelvis x-ray is unremarkable.  CBC with WBC count of 15.1, no evidence of acute anemia and normal platelets.  CMP shows no significant electrolyte or metabolic derangements.  UA has some ketones but no clear evidence of infection.  Ammonia is within normal limits.  TSH is slightly elevated at 5.28 although likely not explaining patient's acute inability ambulate and reported leaning to the right side.  Procalcitonin is slightly elevated 0.38.  Discussed with patient's sister and guardian Mardi Mainland initial presentation work-up.  Given concern for possible infection versus CVA we will obtain a lactate continue IV fluids and obtain MR brain.  Care patient signed over to oncoming provider at approximately  1900.  Plan is to follow-up MRI, lactic acid and reassess.     ____________________________________________   FINAL CLINICAL IMPRESSION(S) / ED DIAGNOSES  Final diagnoses:  Altered mental status, unspecified altered mental status type    Medications  lactated ringers bolus 1,000 mL (has no administration in time range)  LORazepam (ATIVAN) 2 MG/ML injection (2 mg Intravenous Given 05/26/21 2030)  lactated ringers bolus 500 mL (0 mLs Intravenous Stopped 05/26/21 2117)     ED Discharge Orders     None        Note:  This document was prepared using Dragon voice recognition software and may include unintentional dictation errors.    Gilles Chiquito, MD 05/27/21 Marlyne Beards

## 2021-05-27 ENCOUNTER — Emergency Department: Payer: Medicare Other

## 2021-05-27 DIAGNOSIS — R5383 Other fatigue: Secondary | ICD-10-CM | POA: Diagnosis not present

## 2021-05-27 DIAGNOSIS — Z1611 Resistance to penicillins: Secondary | ICD-10-CM | POA: Diagnosis present

## 2021-05-27 DIAGNOSIS — R451 Restlessness and agitation: Secondary | ICD-10-CM | POA: Diagnosis not present

## 2021-05-27 DIAGNOSIS — E860 Dehydration: Secondary | ICD-10-CM

## 2021-05-27 DIAGNOSIS — G4709 Other insomnia: Secondary | ICD-10-CM | POA: Diagnosis not present

## 2021-05-27 DIAGNOSIS — L89892 Pressure ulcer of other site, stage 2: Secondary | ICD-10-CM | POA: Diagnosis not present

## 2021-05-27 DIAGNOSIS — N390 Urinary tract infection, site not specified: Secondary | ICD-10-CM | POA: Diagnosis present

## 2021-05-27 DIAGNOSIS — I959 Hypotension, unspecified: Secondary | ICD-10-CM | POA: Diagnosis present

## 2021-05-27 DIAGNOSIS — Z515 Encounter for palliative care: Secondary | ICD-10-CM | POA: Diagnosis not present

## 2021-05-27 DIAGNOSIS — Z66 Do not resuscitate: Secondary | ICD-10-CM | POA: Diagnosis not present

## 2021-05-27 DIAGNOSIS — A4151 Sepsis due to Escherichia coli [E. coli]: Secondary | ICD-10-CM | POA: Diagnosis present

## 2021-05-27 DIAGNOSIS — R4182 Altered mental status, unspecified: Secondary | ICD-10-CM

## 2021-05-27 DIAGNOSIS — W19XXXD Unspecified fall, subsequent encounter: Secondary | ICD-10-CM | POA: Diagnosis not present

## 2021-05-27 DIAGNOSIS — R6520 Severe sepsis without septic shock: Secondary | ICD-10-CM | POA: Diagnosis not present

## 2021-05-27 DIAGNOSIS — R7989 Other specified abnormal findings of blood chemistry: Secondary | ICD-10-CM | POA: Diagnosis not present

## 2021-05-27 DIAGNOSIS — F72 Severe intellectual disabilities: Secondary | ICD-10-CM | POA: Diagnosis present

## 2021-05-27 DIAGNOSIS — G35 Multiple sclerosis: Secondary | ICD-10-CM | POA: Diagnosis present

## 2021-05-27 DIAGNOSIS — Z20822 Contact with and (suspected) exposure to covid-19: Secondary | ICD-10-CM | POA: Diagnosis present

## 2021-05-27 DIAGNOSIS — G9341 Metabolic encephalopathy: Secondary | ICD-10-CM | POA: Diagnosis present

## 2021-05-27 DIAGNOSIS — R0902 Hypoxemia: Secondary | ICD-10-CM | POA: Diagnosis present

## 2021-05-27 DIAGNOSIS — G40309 Generalized idiopathic epilepsy and epileptic syndromes, not intractable, without status epilepticus: Secondary | ICD-10-CM | POA: Diagnosis not present

## 2021-05-27 DIAGNOSIS — K5909 Other constipation: Secondary | ICD-10-CM | POA: Diagnosis present

## 2021-05-27 DIAGNOSIS — R946 Abnormal results of thyroid function studies: Secondary | ICD-10-CM | POA: Diagnosis present

## 2021-05-27 DIAGNOSIS — G40909 Epilepsy, unspecified, not intractable, without status epilepticus: Secondary | ICD-10-CM | POA: Diagnosis not present

## 2021-05-27 DIAGNOSIS — Z7189 Other specified counseling: Secondary | ICD-10-CM | POA: Diagnosis not present

## 2021-05-27 DIAGNOSIS — R630 Anorexia: Secondary | ICD-10-CM | POA: Diagnosis present

## 2021-05-27 DIAGNOSIS — E876 Hypokalemia: Secondary | ICD-10-CM | POA: Diagnosis present

## 2021-05-27 DIAGNOSIS — Q048 Other specified congenital malformations of brain: Secondary | ICD-10-CM | POA: Diagnosis not present

## 2021-05-27 DIAGNOSIS — Z8673 Personal history of transient ischemic attack (TIA), and cerebral infarction without residual deficits: Secondary | ICD-10-CM | POA: Diagnosis not present

## 2021-05-27 DIAGNOSIS — R296 Repeated falls: Secondary | ICD-10-CM | POA: Diagnosis present

## 2021-05-27 DIAGNOSIS — R7881 Bacteremia: Secondary | ICD-10-CM | POA: Diagnosis not present

## 2021-05-27 DIAGNOSIS — E875 Hyperkalemia: Secondary | ICD-10-CM | POA: Diagnosis not present

## 2021-05-27 DIAGNOSIS — G40409 Other generalized epilepsy and epileptic syndromes, not intractable, without status epilepticus: Secondary | ICD-10-CM | POA: Diagnosis present

## 2021-05-27 DIAGNOSIS — G4700 Insomnia, unspecified: Secondary | ICD-10-CM | POA: Diagnosis not present

## 2021-05-27 DIAGNOSIS — Z79899 Other long term (current) drug therapy: Secondary | ICD-10-CM | POA: Diagnosis not present

## 2021-05-27 DIAGNOSIS — Z9152 Personal history of nonsuicidal self-harm: Secondary | ICD-10-CM | POA: Diagnosis not present

## 2021-05-27 LAB — BLOOD CULTURE ID PANEL (REFLEXED) - BCID2
A.calcoaceticus-baumannii: NOT DETECTED
Bacteroides fragilis: NOT DETECTED
CTX-M ESBL: NOT DETECTED
Candida albicans: NOT DETECTED
Candida auris: NOT DETECTED
Candida glabrata: NOT DETECTED
Candida krusei: NOT DETECTED
Candida parapsilosis: NOT DETECTED
Candida tropicalis: NOT DETECTED
Carbapenem resist OXA 48 LIKE: NOT DETECTED
Carbapenem resistance IMP: NOT DETECTED
Carbapenem resistance KPC: NOT DETECTED
Carbapenem resistance NDM: NOT DETECTED
Carbapenem resistance VIM: NOT DETECTED
Cryptococcus neoformans/gattii: NOT DETECTED
Enterobacter cloacae complex: NOT DETECTED
Enterobacterales: DETECTED — AB
Enterococcus Faecium: NOT DETECTED
Enterococcus faecalis: NOT DETECTED
Escherichia coli: DETECTED — AB
Haemophilus influenzae: NOT DETECTED
Klebsiella aerogenes: NOT DETECTED
Klebsiella oxytoca: NOT DETECTED
Klebsiella pneumoniae: NOT DETECTED
Listeria monocytogenes: NOT DETECTED
Neisseria meningitidis: NOT DETECTED
Proteus species: NOT DETECTED
Pseudomonas aeruginosa: NOT DETECTED
Salmonella species: NOT DETECTED
Serratia marcescens: NOT DETECTED
Staphylococcus species: NOT DETECTED
Stenotrophomonas maltophilia: NOT DETECTED
Streptococcus agalactiae: NOT DETECTED
Streptococcus pneumoniae: NOT DETECTED
Streptococcus pyogenes: NOT DETECTED
Streptococcus species: NOT DETECTED

## 2021-05-27 LAB — BASIC METABOLIC PANEL
Anion gap: 8 (ref 5–15)
BUN: 16 mg/dL (ref 8–23)
CO2: 25 mmol/L (ref 22–32)
Calcium: 8.6 mg/dL — ABNORMAL LOW (ref 8.9–10.3)
Chloride: 103 mmol/L (ref 98–111)
Creatinine, Ser: 0.81 mg/dL (ref 0.44–1.00)
GFR, Estimated: >60 mL/min (ref >60)
Glucose, Bld: 94 mg/dL (ref 70–99)
Potassium: 4.3 mmol/L (ref 3.5–5.1)
Sodium: 136 mmol/L (ref 135–145)

## 2021-05-27 LAB — CBC
HCT: 33.2 % — ABNORMAL LOW (ref 36.0–46.0)
Hemoglobin: 11.6 g/dL — ABNORMAL LOW (ref 12.0–15.0)
MCH: 37.4 pg — ABNORMAL HIGH (ref 26.0–34.0)
MCHC: 34.9 g/dL (ref 30.0–36.0)
MCV: 107.1 fL — ABNORMAL HIGH (ref 80.0–100.0)
Platelets: 161 10*3/uL (ref 150–400)
RBC: 3.1 MIL/uL — ABNORMAL LOW (ref 3.87–5.11)
RDW: 13.5 % (ref 11.5–15.5)
WBC: 20.4 10*3/uL — ABNORMAL HIGH (ref 4.0–10.5)
nRBC: 0 % (ref 0.0–0.2)

## 2021-05-27 LAB — LACTIC ACID, PLASMA
Lactic Acid, Venous: 2 mmol/L (ref 0.5–1.9)
Lactic Acid, Venous: 2.2 mmol/L (ref 0.5–1.9)
Lactic Acid, Venous: 2.5 mmol/L (ref 0.5–1.9)
Lactic Acid, Venous: 2.8 mmol/L (ref 0.5–1.9)

## 2021-05-27 LAB — PROTIME-INR
INR: 1.1 (ref 0.8–1.2)
Prothrombin Time: 14.3 seconds (ref 11.4–15.2)

## 2021-05-27 LAB — APTT: aPTT: 26 seconds (ref 24–36)

## 2021-05-27 LAB — PROCALCITONIN: Procalcitonin: 0.27 ng/mL

## 2021-05-27 LAB — HIV ANTIBODY (ROUTINE TESTING W REFLEX): HIV Screen 4th Generation wRfx: NONREACTIVE

## 2021-05-27 MED ORDER — TOLNAFTATE 1 % EX POWD: 1.0000 "application " | Freq: Two times a day (BID) | CUTANEOUS | Status: DC

## 2021-05-27 MED ORDER — DOUBLE ANTIBIOTIC 500-10000 UNIT/GM EX OINT
TOPICAL_OINTMENT | Freq: Two times a day (BID) | CUTANEOUS | Status: DC
  Filled 2021-05-27 (×2): qty 28.4

## 2021-05-27 MED ORDER — ONDANSETRON HCL 4 MG PO TABS: 4.0000 mg | ORAL_TABLET | Freq: Four times a day (QID) | ORAL | Status: DC | PRN

## 2021-05-27 MED ORDER — DIVALPROEX SODIUM 500 MG PO DR TAB
500.0000 mg | DELAYED_RELEASE_TABLET | Freq: Two times a day (BID) | ORAL | Status: DC
  Filled 2021-05-27 (×5): qty 1

## 2021-05-27 MED ORDER — HYDROXYZINE HCL 25 MG PO TABS
25.0000 mg | ORAL_TABLET | Freq: Three times a day (TID) | ORAL | Status: DC | PRN
  Filled 2021-05-27: qty 1

## 2021-05-27 MED ORDER — SENNA 8.6 MG PO TABS
2.0000 | ORAL_TABLET | Freq: Two times a day (BID) | ORAL | Status: DC
  Administered 2021-05-30 – 2021-06-01 (×4): 17.2 mg via ORAL
  Filled 2021-05-27 (×4): qty 2

## 2021-05-27 MED ORDER — MAGNESIUM HYDROXIDE 800 MG/5ML PO SUSP: 30.0000 mL | Freq: Every day | ORAL | Status: DC | PRN

## 2021-05-27 MED ORDER — LOPERAMIDE HCL 2 MG PO CAPS
2.0000 mg | ORAL_CAPSULE | ORAL | Status: DC | PRN
  Filled 2021-05-27: qty 1

## 2021-05-27 MED ORDER — VITAMIN D3 25 MCG (1000 UNIT) PO TABS
2000.0000 [IU] | ORAL_TABLET | Freq: Every day | ORAL | Status: DC
  Administered 2021-05-30 – 2021-06-01 (×4): 2000 [IU] via ORAL
  Filled 2021-05-27 (×9): qty 2

## 2021-05-27 MED ORDER — ALUM & MAG HYDROXIDE-SIMETH 200-200-20 MG/5ML PO SUSP
30.0000 mL | Freq: Three times a day (TID) | ORAL | Status: DC
  Administered 2021-05-31 – 2021-06-01 (×3): 30 mL via ORAL
  Filled 2021-05-27 (×6): qty 30

## 2021-05-27 MED ORDER — LACTATED RINGERS IV BOLUS
1000.0000 mL | Freq: Once | INTRAVENOUS | Status: AC
  Administered 2021-05-27: 1000 mL via INTRAVENOUS

## 2021-05-27 MED ORDER — SODIUM CHLORIDE 0.9 % IV SOLN: INTRAVENOUS | Status: DC

## 2021-05-27 MED ORDER — LORAZEPAM 2 MG/ML IJ SOLN
2.0000 mg | Freq: Once | INTRAMUSCULAR | Status: AC
  Administered 2021-05-27: 2 mg via INTRAVENOUS
  Filled 2021-05-27: qty 1

## 2021-05-27 MED ORDER — NIVEA EX LOTN: TOPICAL_LOTION | CUTANEOUS | Status: DC | PRN

## 2021-05-27 MED ORDER — LORATADINE 10 MG PO TABS
10.0000 mg | ORAL_TABLET | Freq: Every day | ORAL | Status: DC
  Administered 2021-05-30 – 2021-06-01 (×3): 10 mg via ORAL
  Filled 2021-05-27 (×3): qty 1

## 2021-05-27 MED ORDER — ONDANSETRON HCL 4 MG/2ML IJ SOLN: 4.0000 mg | Freq: Four times a day (QID) | INTRAMUSCULAR | Status: DC | PRN

## 2021-05-27 MED ORDER — LACTATED RINGERS IV SOLN: INTRAVENOUS | Status: DC

## 2021-05-27 MED ORDER — DULOXETINE HCL 30 MG PO CPEP
90.0000 mg | ORAL_CAPSULE | Freq: Every day | ORAL | Status: DC
  Administered 2021-05-30 – 2021-06-01 (×3): 90 mg via ORAL
  Filled 2021-05-27 (×4): qty 3

## 2021-05-27 MED ORDER — ACETAMINOPHEN 325 MG PO TABS: 650.0000 mg | ORAL_TABLET | Freq: Four times a day (QID) | ORAL | Status: DC | PRN

## 2021-05-27 MED ORDER — BISACODYL 10 MG RE SUPP
10.0000 mg | RECTAL | Status: DC | PRN
  Filled 2021-05-27: qty 1

## 2021-05-27 MED ORDER — GUAIFENESIN 100 MG/5ML PO SYRP
200.0000 mg | ORAL_SOLUTION | Freq: Three times a day (TID) | ORAL | Status: DC | PRN
  Filled 2021-05-27: qty 10

## 2021-05-27 MED ORDER — FUROSEMIDE 40 MG PO TABS: 20.0000 mg | ORAL_TABLET | Freq: Every day | ORAL | Status: DC

## 2021-05-27 MED ORDER — ENOXAPARIN SODIUM 40 MG/0.4ML IJ SOSY
40.0000 mg | PREFILLED_SYRINGE | INTRAMUSCULAR | Status: DC
  Administered 2021-05-27 – 2021-06-01 (×6): 40 mg via SUBCUTANEOUS
  Filled 2021-05-27 (×6): qty 0.4

## 2021-05-27 MED ORDER — ACETAMINOPHEN 650 MG RE SUPP: 650.0000 mg | Freq: Four times a day (QID) | RECTAL | Status: DC | PRN

## 2021-05-27 MED ORDER — MAGNESIUM HYDROXIDE 400 MG/5ML PO SUSP
30.0000 mL | Freq: Every day | ORAL | Status: DC | PRN
Start: 2021-05-27 — End: 2021-06-01
  Filled 2021-05-27: qty 30

## 2021-05-27 MED ORDER — TRAZODONE HCL 50 MG PO TABS: 25.0000 mg | ORAL_TABLET | Freq: Every evening | ORAL | Status: DC | PRN

## 2021-05-27 MED ORDER — DULOXETINE HCL 60 MG PO CPEP: 60.0000 mg | ORAL_CAPSULE | Freq: Every day | ORAL | Status: DC

## 2021-05-27 MED ORDER — CALCIUM CITRATE-VITAMIN D 500-500 MG-UNIT PO CHEW
CHEWABLE_TABLET | Freq: Two times a day (BID) | ORAL | Status: DC
  Filled 2021-05-27 (×12): qty 0.5

## 2021-05-27 NOTE — Progress Notes (Signed)
PROGRESS NOTE    Sabrina Johnston  JFH:[id number] DOB: 12/01/1955 DOA: 05/26/2021 PCP: Lauro Regulus, MD   Brief Narrative: Taken from H&P. Sabrina Johnston is a 65 y.o. female with medical history significant for cognitive and neurobehavioral dysfunction, seizure disorder and chronic constipation, who presented to the emergency room with acute onset of generalized weakness and altered mental status.  The patient has not been ambulating over the last 24 hours.  She has not been eating or drinking over the last couple of days.  She is unable to provide any history due to her baseline mental status however according to the legal guardian who lives in [place] she is nonverbal at baseline but able to walk somewhat.  Also history of recent multiple falls.  She was mostly dependent for her ADLs.  Sometimes able to feed herself but need a lot of supervision.  Found to have mild leukocytosis, mildly elevated TSH, mild lactic acidosis in twos, UA with some bacteria and mild leukocytosis, urine and blood cultures pending Abdominal and chest x-ray without any acute abnormalities.  Pelvic x-rays with no fractures.  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  Subjective: Patient was sleeping when seen today.  Easily arousable but refusing to answer any questions.  Appears to be severely cognitively impaired.  Assessment & Plan:   Active Problems:   Dehydration  Altered mental status.  There was some concern of decreased p.o. intake and dehydration.  Per legal guardian she was told that patient was not eating well and having multiple falls recently. Unable to explain any symptoms due to underlying severe cognitive impairment.  At baseline she is nonverbal and mostly dependent on others for ADLs. No focal neurologic deficit.  Imaging  without any acute changes. -Continue with normal saline for another day.  SARS response.  Patient was found to have softer blood pressure with mildly elevated lactic acid, mild tachypnea and tachycardia along with leukocytosis.  No obvious source of infection at this time she did not had any fever.  We were holding off antibiotics at this time. -Monitor CBC -Follow-up urine and blood cultures. -Monitor lactic acid  Seizure disorder. -Continue home dose of Depakote and Valium  Multiple sclerosis.  No obvious flare. -Continue to monitor  Physical deconditioning/multiple falls. -PT/OT evaluation  Chronic constipation. -Continue with bowel regimen  Objective: Vitals:   05/27/21 0626 05/27/21 0749 05/27/21 1156 05/27/21 1547  BP: (!) 99/54 (!) 93/54 96/71 99/72   Pulse: 89 88 86 88  Resp: 18 20 19 20   Temp: 99.2 F (37.3 C) (!) 97.3 F (36.3 C) 98.6 F (37 C) (!) 97.5 F (36.4 C)  TempSrc: Oral Oral  Oral  SpO2: 99% 100% 97% 100%  Weight: 66.7 kg     Height: 5' (1.524 m)       Intake/Output Summary (Last 24 hours) at 05/27/2021 1638 Last data filed at 05/27/2021 1407 Gross per 24 hour  Intake 1740 ml  Output --  Net 1740 ml   Filed Weights   05/26/21 2026 05/27/21 0626  Weight: 67.1 kg 66.7 kg    Examination:  General exam: Cognitively impaired, nonverbal, lady appears calm and comfortable  Respiratory system: Clear to auscultation. Respiratory effort normal. Cardiovascular system: S1 & S2 heard, RRR.  Gastrointestinal system: Soft, nontender, nondistended, bowel sounds positive. Central nervous system: Somnolent, arousable but refusing  to follow any commands, moving all extremities. Extremities: No edema, no cyanosis, pulses intact and symmetrical. Psychiatry: Judgement and insight appear impaired.   DVT prophylaxis: Lovenox Code Status: Full Family Communication: Discussed with legal guardian on phone who lives in [place], patient is living in a group home,  cognitively impaired, nonverbal at baseline, able to walk but balance is not good and apparently had multiple falls recently. Disposition Plan:  Status is: Inpatient  Remains inpatient appropriate because:Inpatient level of care appropriate due to severity of illness  Dispo: The patient is from: Group home              Anticipated d/c is to: Group home              Patient currently is not medically stable to d/c.   Difficult to place patient No              Level of care: Med-Surg  All the records are reviewed and case discussed with Care Management/Social Worker. Management plans discussed with the patient, nursing and they are in agreement.  Consultants:  None  Procedures:  Antimicrobials:   Data Reviewed: I have personally reviewed following labs and imaging studies  CBC: Recent Labs  Lab 05/26/21 2040 05/27/21 0957  WBC 13.1* 20.4*  NEUTROABS 8.7*  --   HGB 13.2 11.6*  HCT 38.3 33.2*  MCV 110.1* 107.1*  PLT 210 161   Basic Metabolic Panel: Recent Labs  Lab 05/26/21 2040 05/27/21 0957  NA 136 136  K 5.0 4.3  CL 99 103  CO2 25 25  GLUCOSE 145* 94  BUN 20 16  CREATININE 1.06* 0.81  CALCIUM 9.2 8.6*   GFR: Estimated Creatinine Clearance: 60.6 mL/min (by C-G formula based on SCr of 0.81 mg/dL). Liver Function Tests: Recent Labs  Lab 05/26/21 2040  AST 39  ALT 18  ALKPHOS 87  BILITOT 0.7  PROT 6.4*  ALBUMIN 2.6*   No results for input(s): LIPASE, AMYLASE in the last 168 hours. Recent Labs  Lab 05/26/21 2040  AMMONIA 18   Coagulation Profile: Recent Labs  Lab 05/27/21 0127  INR 1.1   Cardiac Enzymes: No results for input(s): CKTOTAL, CKMB, CKMBINDEX, TROPONINI in the last 168 hours. BNP (last 3 results) No results for input(s): PROBNP in the last 8760 hours. HbA1C: No results for input(s): HGBA1C in the last 72 hours. CBG: No results for input(s): GLUCAP in the last 168 hours. Lipid Profile: No results for input(s): CHOL, HDL, LDLCALC,  TRIG, CHOLHDL, LDLDIRECT in the last 72 hours. Thyroid Function Tests: Recent Labs    05/26/21 2040  TSH 5.283*   Anemia Panel: No results for input(s): VITAMINB12, FOLATE, FERRITIN, TIBC, IRON, RETICCTPCT in the last 72 hours. Sepsis Labs: Recent Labs  Lab 05/26/21 2040 05/27/21 0005 05/27/21 0126 05/27/21 0957 05/27/21 1320  PROCALCITON 0.38  --   --  0.27  --   LATICACIDVEN  --  2.0* 2.8* 2.2* 2.5*    Recent Results (from the past 240 hour(s))  Resp Panel by RT-PCR (Flu A&B, Covid)     Status: None   Collection Time: 05/26/21  8:40 PM   Specimen: Nasopharyngeal(NP) swabs in vial transport medium  Result Value Ref Range Status   SARS Coronavirus 2 by RT PCR NEGATIVE NEGATIVE Final    Comment: (NOTE) SARS-CoV-2 target nucleic acids are NOT DETECTED.  The SARS-CoV-2 RNA is generally detectable in upper respiratory specimens during the acute phase of infection. The lowest concentration of SARS-CoV-2  viral copies this assay can detect is 138 copies/mL. A negative result does not preclude SARS-Cov-2 infection and should not be used as the sole basis for treatment or other patient management decisions. A negative result may occur with  improper specimen collection/handling, submission of specimen other than nasopharyngeal swab, presence of viral mutation(s) within the areas targeted by this assay, and inadequate number of viral copies(<138 copies/mL). A negative result must be combined with clinical observations, patient history, and epidemiological information. The expected result is Negative.  Fact Sheet for Patients:  BloggerCourse.com  Fact Sheet for Healthcare Providers:  SeriousBroker.it  This test is no t yet approved or cleared by the [place] FDA and  has been authorized for detection and/or diagnosis of SARS-CoV-2 by FDA under an Emergency Use Authorization (EUA). This EUA will remain  in effect (meaning  this test can be used) for the duration of the COVID-19 declaration under Section 564(b)(1) of the Act, 21 U.S.C.section 360bbb-3(b)(1), unless the authorization is terminated  or revoked sooner.       Influenza A by PCR NEGATIVE NEGATIVE Final   Influenza B by PCR NEGATIVE NEGATIVE Final    Comment: (NOTE) The Xpert Xpress SARS-CoV-2/FLU/RSV plus assay is intended as an aid in the diagnosis of influenza from Nasopharyngeal swab specimens and should not be used as a sole basis for treatment. Nasal washings and aspirates are unacceptable for Xpert Xpress SARS-CoV-2/FLU/RSV testing.  Fact Sheet for Patients: BloggerCourse.com  Fact Sheet for Healthcare Providers: SeriousBroker.it  This test is not yet approved or cleared by the [place] FDA and has been authorized for detection and/or diagnosis of SARS-CoV-2 by FDA under an Emergency Use Authorization (EUA). This EUA will remain in effect (meaning this test can be used) for the duration of the COVID-19 declaration under Section 564(b)(1) of the Act, 21 U.S.C. section 360bbb-3(b)(1), unless the authorization is terminated or revoked.  Performed at [facility], [address], [place], [place] [address]      Radiology Studies: DG Abdomen 1 View  Result Date: 05/27/2021 CLINICAL DATA:  MRI screening. EXAM: ABDOMEN - 1 VIEW COMPARISON:  None. FINDINGS: The study is limited secondary to patient motion. The bowel gas pattern is normal. A moderate amount of stool is seen throughout the colon. No radio-opaque calculi or other significant radiographic abnormality are seen. No radiopaque metallic density foci are seen. IMPRESSION: Negative. Electronically Signed   By: Aram Candela M.D.   On: 05/27/2021 00:55   CT HEAD WO CONTRAST ( )  Result Date: 05/26/2021 CLINICAL DATA:  Mental status change right-sided weakness. EXAM: CT HEAD WITHOUT CONTRAST CT MAXILLOFACIAL  WITHOUT CONTRAST CT CERVICAL SPINE WITHOUT CONTRAST TECHNIQUE: Multidetector CT imaging of the head, cervical spine, and maxillofacial structures were performed using the standard protocol without intravenous contrast. Multiplanar CT image reconstructions of the cervical spine and maxillofacial structures were also generated. COMPARISON:  None. FINDINGS: CT HEAD FINDINGS Brain: Bilateral parieto-occipital encephalomalacia noted with ex vacuo dilation the ventricles. Remote anterior right frontal lobe infarct present. Remote lacunar infarct is present right caudate head. Basal ganglia are otherwise intact. Mild cerebellar atrophy noted. Hemorrhage or mass lesion is present. No significant extraaxial fluid collection is present. Vascular: No hyperdense vessel or unexpected calcification. Skull: Calvarium is intact. No focal lytic or blastic lesions are present. No significant extracranial soft tissue lesion is present. CT MAXILLOFACIAL FINDINGS Osseous: No acute or healing fractures are present. Mandible is intact and located. Patient is edentulous. Orbits: The globes and orbits are  within normal limits. Sinuses: The paranasal sinuses and mastoid air cells are clear. Soft tissues: The soft tissues of face are unremarkable. CT CERVICAL SPINE FINDINGS Alignment: No significant listhesis is present. Straightening of the normal cervical lordosis is present. Skull base and vertebrae: Craniocervical junction is within normal limits. Vertebral body heights are maintained. No acute or healing fractures are present. Soft tissues and spinal canal: No prevertebral fluid or swelling. No visible canal hematoma. Disc levels: Uncovertebral and facet disease is present in the cervical spine. Foraminal narrowing is greatest at C3-4, right greater than left. Facet hypertrophy results in moderate left foraminal stenosis at C2-3. Upper chest: The lung apices are clear. Thoracic inlet is within normal limits. IMPRESSION: 1. Bilateral  chronic parieto-occipital encephalomalacia with ex vacuo dilation the ventricles. 2. Remote anterior right frontal lobe infarct. 3. 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 4. No acute intracranial abnormality. 5. No acute or healing fractures of the face. 6. Multilevel degenerative changes of the cervical spine without acute fracture or traumatic subluxation. Electronically Signed   By: Marin Roberts M.D.   On: 05/26/2021 21:22   CT Cervical Spine Wo Contrast  Result Date: 05/26/2021 CLINICAL DATA:  Mental status change right-sided weakness. EXAM: CT HEAD WITHOUT CONTRAST CT MAXILLOFACIAL WITHOUT CONTRAST CT CERVICAL SPINE WITHOUT CONTRAST TECHNIQUE: Multidetector CT imaging of the head, cervical spine, and maxillofacial structures were performed using the standard protocol without intravenous contrast. Multiplanar CT image reconstructions of the cervical spine and maxillofacial structures were also generated. COMPARISON:  None. FINDINGS: CT HEAD FINDINGS Brain: Bilateral parieto-occipital encephalomalacia noted with ex vacuo dilation the ventricles. Remote anterior right frontal lobe infarct present. Remote lacunar infarct is present right caudate head. Basal ganglia are otherwise intact. Mild cerebellar atrophy noted. Hemorrhage or mass lesion is present. No significant extraaxial fluid collection is present. Vascular: No hyperdense vessel or unexpected calcification. Skull: Calvarium is intact. No focal lytic or blastic lesions are present. No significant extracranial soft tissue lesion is present. CT MAXILLOFACIAL FINDINGS Osseous: No acute or healing fractures are present. Mandible is intact and located. Patient is edentulous. Orbits: The globes and orbits are within normal limits. Sinuses: The paranasal sinuses and mastoid air cells are clear. Soft tissues: The soft tissues of face are unremarkable. CT CERVICAL SPINE FINDINGS Alignment: No significant listhesis is present. Straightening of  the normal cervical lordosis is present. Skull base and vertebrae: Craniocervical junction is within normal limits. Vertebral body heights are maintained. No acute or healing fractures are present. Soft tissues and spinal canal: No prevertebral fluid or swelling. No visible canal hematoma. Disc levels: Uncovertebral and facet disease is present in the cervical spine. Foraminal narrowing is greatest at C3-4, right greater than left. Facet hypertrophy results in moderate left foraminal stenosis at C2-3. Upper chest: The lung apices are clear. Thoracic inlet is within normal limits. IMPRESSION: 1. 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 2. Remote anterior right frontal lobe infarct. 3. 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 4. No acute intracranial abnormality. 5. No acute or healing fractures of the face. 6. Multilevel degenerative changes of the cervical spine without acute fracture or traumatic subluxation. Electronically Signed   By: Marin Roberts M.D.   On: 05/26/2021 21:22   MR BRAIN WO CONTRAST  Result Date: 05/27/2021 CLINICAL DATA:  Initial evaluation for right-sided weakness, decreased PO intake. EXAM: MRI HEAD WITHOUT CONTRAST TECHNIQUE: Multiplanar, multiecho pulse sequences of the brain and surrounding structures were obtained without intravenous  contrast. COMPARISON:  CT from 05/26/2021. FINDINGS: Brain: Examination severely degraded by motion artifact, with several sequence markedly degraded and essentially nondiagnostic. 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 Marked thinning of the overlying cortical mantle. Findings presumably chronic and congenital in nature. No visible transependymal flow of CSF on this limited exam. 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 The cerebellum and brainstem appear more normally formed. Focal area of probable encephalomalacia at the anterior right  frontal lobe, suggesting prior right MCA distribution infarct. Associated remote lacunar infarct at the right caudate better seen on prior CT. No convincing foci of restricted diffusion to suggest acute or subacute ischemia. No other visible areas of chronic infarction. No convincing foci of susceptibility artifact to suggest acute or chronic intracranial hemorrhage. No visible mass lesion or mass effect. No midline shift. No visible extra-axial fluid collection. Vascular: Major intracranial vascular flow voids are poorly assessed on this limited exam. Normal flow void is seen within the partially visualized basilar artery. Skull and upper cervical spine: Craniocervical junction not well assessed due to motion. Bone marrow signal intensity grossly within normal limits. No visible scalp soft tissue abnormality. Sinuses/Orbits: Globes and orbital soft tissues demonstrate no obvious abnormality. Paranasal sinuses are largely clear. Small chronic appearing right mastoid effusion noted. Other: None. IMPRESSION: 1. Severely limited exam due to extensive motion artifact. 2. No definite acute intracranial infarct or other abnormality identified. 3. 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 4. 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 Electronically Signed   By: Rise Mu M.D.   On: 05/27/2021 03:23   DG Pelvis Portable  Result Date: 05/26/2021 CLINICAL DATA:  Falls EXAM: PORTABLE PELVIS 1-2 VIEWS COMPARISON:  None. FINDINGS: There is no evidence of pelvic fracture or diastasis. No pelvic bone lesions are seen. IMPRESSION: Negative. Electronically Signed   By: Jasmine Pang M.D.   On: 05/26/2021 22:54   DG Chest Portable 1 View  Result Date: 05/26/2021 CLINICAL DATA:  Fall weakness EXAM: PORTABLE CHEST 1 VIEW COMPARISON:  Report 01/21/2015 FINDINGS: Single-view chest demonstrates no focal opacity  or pleural effusion. Normal cardiomediastinal silhouette. No pneumothorax. IMPRESSION: No active disease. Electronically Signed   By: Jasmine Pang M.D.   On: 05/26/2021 22:54   CT Maxillofacial Wo Contrast  Result Date: 05/26/2021 CLINICAL DATA:  Mental status change right-sided weakness. EXAM: CT HEAD WITHOUT CONTRAST CT MAXILLOFACIAL WITHOUT CONTRAST CT CERVICAL SPINE WITHOUT CONTRAST TECHNIQUE: Multidetector CT imaging of the head, cervical spine, and maxillofacial structures were performed using the standard protocol without intravenous contrast. Multiplanar CT image reconstructions of the cervical spine and maxillofacial structures were also generated. COMPARISON:  None. FINDINGS: CT HEAD FINDINGS Brain: Bilateral parieto-occipital encephalomalacia noted with ex vacuo dilation the ventricles. Remote anterior right frontal lobe infarct present. Remote lacunar infarct is present right caudate head. Basal ganglia are otherwise intact. Mild cerebellar atrophy noted. Hemorrhage or mass lesion is present. No significant extraaxial fluid collection is present. Vascular: No hyperdense vessel or unexpected calcification. Skull: Calvarium is intact. No focal lytic or blastic lesions are present. No significant extracranial soft tissue lesion is present. CT MAXILLOFACIAL FINDINGS Osseous: No acute or healing fractures are present. Mandible is intact and located. Patient is edentulous. Orbits: The globes and orbits are within normal limits. Sinuses: The paranasal sinuses and mastoid air cells are clear. Soft tissues: The soft tissues of face are unremarkable. CT CERVICAL SPINE FINDINGS Alignment: No significant listhesis is present.  Straightening of the normal cervical lordosis is present. Skull base and vertebrae: Craniocervical junction is within normal limits. Vertebral body heights are maintained. No acute or healing fractures are present. Soft tissues and spinal canal: No prevertebral fluid or swelling. No  visible canal hematoma. Disc levels: Uncovertebral and facet disease is present in the cervical spine. Foraminal narrowing is greatest at C3-4, right greater than left. Facet hypertrophy results in moderate left foraminal stenosis at C2-3. Upper chest: The lung apices are clear. Thoracic inlet is within normal limits. IMPRESSION: 1. 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 2. Remote anterior right frontal lobe infarct. 3. 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 4. No acute intracranial abnormality. 5. No acute or healing fractures of the face. 6. Multilevel degenerative changes of the cervical spine without acute fracture or traumatic subluxation. Electronically Signed   By: Marin Robertshristopher  Mattern M.D.   On: 05/26/2021 21:22    Scheduled Meds:  alum & mag hydroxide-simeth  30 mL Oral TID AC & HS   calcium citrate-vitamin D   Oral BID   cholecalciferol  2,000 Units Oral Daily   divalproex  500 mg Oral BID   DULoxetine  90 mg Oral Daily   enoxaparin (LOVENOX) injection  40 mg Subcutaneous Q24H   loratadine  10 mg Oral Daily   polymixin-bacitracin   Topical BID   senna  2 tablet Oral BID   tolnaftate  1 application Topical BID   Continuous Infusions:  sodium chloride 100 mL/hr at 05/27/21 0659   lactated ringers       LOS: 0 days   Time spent: 40 minutes. More than 50% of the time was spent in counseling/coordination of care  Arnetha CourserSumayya Birda Didonato, MD [facility]  If 7PM-7AM, please contact night-coverage [email or web address]  05/27/2021, 4:38 PM   This record has been created using Conservation officer, historic buildingsDragon voice recognition software. Errors have been sought and corrected,but may not always be located. Such creation errors do not reflect on the standard of care.

## 2021-05-27 NOTE — H&P (Signed)
PATIENT NAME: Sabrina Johnston    MR#:  [id number]  DATE OF BIRTH:  03-04-1956  DATE OF ADMISSION:  05/26/2021  PRIMARY CARE PHYSICIAN: Lauro Regulus, MD   Patient is coming from: Group Home  REQUESTING/REFERRING PHYSICIAN: Nita Sickle, MD  CHIEF COMPLAINT:   Chief Complaint  Patient presents with  . Weakness    C/o decreased fluid and food intake with weakness toward right side, and dark malodorous urine. Denies fevers.    HISTORY OF PRESENT ILLNESS:  Sabrina Johnston is a 65 y.o. female with medical history significant for cognitive and neurobehavioral dysfunction, seizure disorder and chronic constipation, who presented to the emergency room with acute onset of generalized weakness and altered mental status.  The patient has not been ambulating over the last 24 hours.  She has not been eating or drinking over the last couple of days.  She is unable to provide any history due to her baseline mental status however per the staff at her group home she was able to speak and mention that she had a couple falls during the last couple of days.  She was noted to be leaning to the right side.  She was noted to be hitting herself in the face more than usual in the last couple of days.  She has subsequent contusions to her face and left ear.  No nausea or vomiting or abdominal pain.  No dysuria, oliguria or hematuria or flank pain.  No fever or chills.  ED Course: When the patient came to the ER, blood pressure was 98/47 and later 82/30 with otherwise normal vital signs and the respiratory it was a 25 and heart rate has been as high as 111. Labs revealed potassium of 5 with troponin of 6.4 albumin 2.6 and otherwise unremarkable CMP.  Lactic acid was 202.8.  Procalcitonin was 0.38.  CBC showed leukocytosis 13.1 with macrocytosis.  TSH was 5.283.  UA was unremarkable. Influenza antigens and COVID-19 PCR came back negative.  Imaging: Abdomen x-ray was unremarkable.   Portable chest ray showed no acute cardiopulmonary disease.  Pelvic x-ray showed no fractures.  Brain MRI without contrast revealed the following: 1. Severely limited exam due to extensive motion artifact. 2. No definite acute intracranial infarct or other abnormality identified. 3. 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 4. 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    The patient was given 2.5 L of IV lactated Ringer bolus followed 125 mL/h as well as 2 mg of IV Ativan for brain MRI.  The patient will be admitted to a medical monitored bed for further evaluation and management. Marland Kitchen PAST MEDICAL HISTORY:   Past Medical History:  Diagnosis Date  . Menopause   . Mental retardation   . PMB (postmenopausal bleeding)     PAST SURGICAL HISTORY:   Past Surgical History:  Procedure Laterality Date  . HYSTEROSCOPY WITH D & C  2013   b9 endometrium wiht polyps    SOCIAL HISTORY:   Social History   Tobacco Use  . Smoking status: Never  . Smokeless tobacco: Never  Substance Use Topics  . Alcohol use: No    FAMILY HISTORY:   Family History  Family history unknown: Yes  Unobtainable as the patient was non-verabal. DRUG ALLERGIES:  No Known Allergies  REVIEW OF SYSTEMS:   ROS As per history of present illness. All pertinent systems  were reviewed above. Constitutional, HEENT, cardiovascular, respiratory, GI, GU, musculoskeletal, neuro, psychiatric, endocrine, integumentary and hematologic systems were reviewed and are otherwise negative/unremarkable except for positive findings mentioned above in the HPI.   MEDICATIONS AT HOME:   Prior to Admission medications   Medication Sig Start Date End Date Taking? Authorizing Provider  acetaminophen (TYLENOL) 325 MG tablet Take 650 mg by mouth every 6 (six) hours as needed.   Yes [provider]  aluminum-magnesium  hydroxide-simethicone (MAALOX) 200-200-20 MG/5ML SUSP Take 30 mLs by mouth 4 (four) times daily -  before meals and at bedtime.   Yes [provider]  bacitracin-polymyxin b (POLYSPORIN) ointment Apply topically 2 (two) times daily.   Yes [provider]  Calcium Citrate-Vitamin D 200-250 MG-UNIT TABS Take 1 tablet by mouth 2 (two) times daily. 02/01/15  Yes [provider]  carbamide peroxide (DEBROX) 6.5 % otic solution 5 drops 2 (two) times daily.   Yes [provider]  cetirizine (ZYRTEC) 10 MG tablet Take 10 mg by mouth daily.   Yes [provider]  Cholecalciferol 50 MCG (2000 UT) CAPS Take 1 capsule by mouth daily. 12/29/20  Yes [provider]  divalproex (DEPAKOTE) 500 MG DR tablet Take 500 mg by mouth 2 (two) times daily. 05/02/21  Yes [provider]  DULoxetine (CYMBALTA) 30 MG capsule Take 30 mg by mouth daily.   Yes [provider]  DULoxetine (CYMBALTA) 60 MG capsule Take 60 mg by mouth daily. 05/02/21  Yes [provider]  furosemide (LASIX) 20 MG tablet Take 20 mg by mouth daily.   Yes [provider]  hydrOXYzine (ATARAX/VISTARIL) 25 MG tablet Take 25 mg by mouth 3 (three) times daily as needed.   Yes [provider]  lanolin-mineral oil Lorina Rabon) LOTN Apply topically as needed.   Yes [provider]  loperamide (IMODIUM) 2 MG capsule Take by mouth as needed for diarrhea or loose stools.   Yes [provider]  magnesium hydroxide (MILK OF MAGNESIA) 800 MG/5ML suspension Take by mouth daily as needed for constipation.   Yes [provider]  Multiple Vitamins-Minerals (CERTAGEN SILVER PO) Take 1 tablet by mouth daily.   Yes [provider]  senna (SENOKOT) 8.6 MG tablet TAKE 2 TABLETS BY MOUTH TWO TIMES A DAY. 09/26/16  Yes [provider]  tolnaftate (TINACTIN) 1 % powder Apply 1 application topically 2 (two) times daily.   Yes [provider]  bisacodyl (DULCOLAX) 10 MG suppository Place 10 mg rectally as needed for moderate constipation.    [provider]  guaifenesin (ROBITUSSIN) 100 MG/5ML syrup Take 200 mg by mouth 3 (three) times daily as needed for cough.    [provider]      VITAL SIGNS:  Blood pressure 119/88, pulse 89, temperature 98.3 F (36.8 C), temperature source Oral, resp. rate (!) 23, weight 67.1 kg, SpO2 99 %.  PHYSICAL EXAMINATION:  Physical Exam  GENERAL:  65 y.o.-year-old female patient lying in the bed with no acute distress.  Moving all 4 extremities. EYES: Pupils equal, round, reactive to light. No scleral icterus. Extraocular muscles intact.  HEENT: Head  normocephalic with left periorbital contusion. Oropharynx and nasopharynx clear.  NECK:  Supple, no jugular venous distention. No thyroid enlargement, no tenderness.  LUNGS: Normal breath sounds bilaterally, no wheezing, rales,rhonchi or crepitation. No use of accessory muscles of respiration.  CARDIOVASCULAR: Regular rate and rhythm, S1, S2 normal. No murmurs, rubs, or gallops.  ABDOMEN: Soft, nondistended,  nontender. Bowel sounds present. No organomegaly or mass.  EXTREMITIES: No pedal edema, cyanosis, or clubbing.  NEUROLOGIC: Grossly nonfocal.  She was moving all 4 extremities.   Extremities. Sensation intact. Gait not checked.  PSYCHIATRIC: The patient is alert and oriented x 3.  Normal affect and good eye contact. SKIN: No obvious rash, lesion, or ulcer.   LABORATORY PANEL:   CBC Recent Labs  Lab 05/26/21 2040  WBC 13.1*  HGB 13.2  HCT 38.3  PLT 210   ------------------------------------------------------------------------------------------------------------------  Chemistries  Recent Labs  Lab 05/26/21 2040  NA 136  K 5.0  CL 99  CO2 25  GLUCOSE 145*  BUN 20  CREATININE 1.06*  CALCIUM 9.2  AST 39  ALT 18  ALKPHOS 87  BILITOT 0.7    ------------------------------------------------------------------------------------------------------------------  Cardiac Enzymes No results for input(s): TROPONINI in the last 168 hours. ------------------------------------------------------------------------------------------------------------------  RADIOLOGY:  DG Abdomen 1 View  Result Date: 05/27/2021 CLINICAL DATA:  MRI screening. EXAM: ABDOMEN - 1 VIEW COMPARISON:  None. FINDINGS: The study is limited secondary to patient motion. The bowel gas pattern is normal. A moderate amount of stool is seen throughout the colon. No radio-opaque calculi or other significant radiographic abnormality are seen. No radiopaque metallic density foci are seen. IMPRESSION: Negative. Electronically Signed   By: Aram Candela M.D.   On: 05/27/2021 00:55   CT HEAD WO CONTRAST ( )  Result Date: 05/26/2021 CLINICAL DATA:  Mental status change right-sided weakness. EXAM: CT HEAD WITHOUT CONTRAST CT MAXILLOFACIAL WITHOUT CONTRAST CT CERVICAL SPINE WITHOUT CONTRAST TECHNIQUE: Multidetector CT imaging of the head, cervical spine, and maxillofacial structures were performed using the standard protocol without intravenous contrast. Multiplanar CT image reconstructions of the cervical spine and maxillofacial structures were also generated. COMPARISON:  None. FINDINGS: CT HEAD FINDINGS Brain: Bilateral parieto-occipital encephalomalacia noted with ex vacuo dilation the ventricles. Remote anterior right frontal lobe infarct present. Remote lacunar infarct is present right caudate head. Basal ganglia are otherwise intact. Mild cerebellar atrophy noted. Hemorrhage or mass lesion is present. No significant extraaxial fluid collection is present. Vascular: No hyperdense vessel or unexpected calcification. Skull: Calvarium is intact. No focal lytic or blastic lesions are present. No significant extracranial soft tissue lesion is present. CT MAXILLOFACIAL FINDINGS Osseous:  No acute or healing fractures are present. Mandible is intact and located. Patient is edentulous. Orbits: The globes and orbits are within normal limits. Sinuses: The paranasal sinuses and mastoid air cells are clear. Soft tissues: The soft tissues of face are unremarkable. CT CERVICAL SPINE FINDINGS Alignment: No significant listhesis is present. Straightening of the normal cervical lordosis is present. Skull base and vertebrae: Craniocervical junction is within normal limits. Vertebral body heights are maintained. No acute or healing fractures are present. Soft tissues and spinal canal: No prevertebral fluid or swelling. No visible canal hematoma. Disc levels: Uncovertebral and facet disease is present in the cervical spine. Foraminal narrowing is greatest at C3-4, right greater than left. Facet hypertrophy results in moderate left foraminal stenosis at C2-3. Upper chest: The lung apices are clear. Thoracic inlet is within normal limits. IMPRESSION: 1. 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 2. Remote anterior right frontal lobe infarct. 3. 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 4. No acute intracranial abnormality. 5. No acute or healing fractures of the face. 6. Multilevel degenerative changes of the cervical spine without acute fracture or traumatic subluxation. Electronically Signed   By: Marin Roberts M.D.   On: 05/26/2021 21:22  CT Cervical Spine Wo Contrast  Result Date: 05/26/2021 CLINICAL DATA:  Mental status change right-sided weakness. EXAM: CT HEAD WITHOUT CONTRAST CT MAXILLOFACIAL WITHOUT CONTRAST CT CERVICAL SPINE WITHOUT CONTRAST TECHNIQUE: Multidetector CT imaging of the head, cervical spine, and maxillofacial structures were performed using the standard protocol without intravenous contrast. Multiplanar CT image reconstructions of the cervical spine and maxillofacial structures were also generated. COMPARISON:  None.  FINDINGS: CT HEAD FINDINGS Brain: Bilateral parieto-occipital encephalomalacia noted with ex vacuo dilation the ventricles. Remote anterior right frontal lobe infarct present. Remote lacunar infarct is present right caudate head. Basal ganglia are otherwise intact. Mild cerebellar atrophy noted. Hemorrhage or mass lesion is present. No significant extraaxial fluid collection is present. Vascular: No hyperdense vessel or unexpected calcification. Skull: Calvarium is intact. No focal lytic or blastic lesions are present. No significant extracranial soft tissue lesion is present. CT MAXILLOFACIAL FINDINGS Osseous: No acute or healing fractures are present. Mandible is intact and located. Patient is edentulous. Orbits: The globes and orbits are within normal limits. Sinuses: The paranasal sinuses and mastoid air cells are clear. Soft tissues: The soft tissues of face are unremarkable. CT CERVICAL SPINE FINDINGS Alignment: No significant listhesis is present. Straightening of the normal cervical lordosis is present. Skull base and vertebrae: Craniocervical junction is within normal limits. Vertebral body heights are maintained. No acute or healing fractures are present. Soft tissues and spinal canal: No prevertebral fluid or swelling. No visible canal hematoma. Disc levels: Uncovertebral and facet disease is present in the cervical spine. Foraminal narrowing is greatest at C3-4, right greater than left. Facet hypertrophy results in moderate left foraminal stenosis at C2-3. Upper chest: The lung apices are clear. Thoracic inlet is within normal limits. IMPRESSION: 1. 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 2. Remote anterior right frontal lobe infarct. 3. 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 4. No acute intracranial abnormality. 5. No acute or healing fractures of the face. 6. Multilevel degenerative changes of the cervical spine without acute fracture or  traumatic subluxation. Electronically Signed   By: Marin Robertshristopher  Mattern M.D.   On: 05/26/2021 21:22   MR BRAIN WO CONTRAST  Result Date: 05/27/2021 CLINICAL DATA:  Initial evaluation for right-sided weakness, decreased PO intake. EXAM: MRI HEAD WITHOUT CONTRAST TECHNIQUE: Multiplanar, multiecho pulse sequences of the brain and surrounding structures were obtained without intravenous contrast. COMPARISON:  CT from 05/26/2021. FINDINGS: Brain: Examination severely degraded by motion artifact, with several sequence markedly degraded and essentially nondiagnostic. 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 Marked thinning of the overlying cortical mantle. Findings presumably chronic and congenital in nature. No visible transependymal flow of CSF on this limited exam. 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 The cerebellum and brainstem appear more normally formed. Focal area of probable encephalomalacia at the anterior right frontal lobe, suggesting prior right MCA distribution infarct. Associated remote lacunar infarct at the right caudate better seen on prior CT. No convincing foci of restricted diffusion to suggest acute or subacute ischemia. No other visible areas of chronic infarction. No convincing foci of susceptibility artifact to suggest acute or chronic intracranial hemorrhage. No visible mass lesion or mass effect. No midline shift. No visible extra-axial fluid collection. Vascular: Major intracranial vascular flow voids are poorly assessed on this limited exam. Normal flow void is seen within the partially visualized basilar artery. Skull and upper cervical spine: Craniocervical junction not well assessed due to motion. Bone marrow signal intensity grossly within normal limits. No visible scalp  soft tissue abnormality. Sinuses/Orbits: Globes and orbital soft tissues demonstrate no obvious abnormality. Paranasal sinuses are largely clear. Small chronic  appearing right mastoid effusion noted. Other: None. IMPRESSION: 1. Severely limited exam due to extensive motion artifact. 2. No definite acute intracranial infarct or other abnormality identified. 3. 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 4. 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 Electronically Signed   By: Rise Mu M.D.   On: 05/27/2021 03:23   DG Pelvis Portable  Result Date: 05/26/2021 CLINICAL DATA:  Falls EXAM: PORTABLE PELVIS 1-2 VIEWS COMPARISON:  None. FINDINGS: There is no evidence of pelvic fracture or diastasis. No pelvic bone lesions are seen. IMPRESSION: Negative. Electronically Signed   By: Jasmine Pang M.D.   On: 05/26/2021 22:54   DG Chest Portable 1 View  Result Date: 05/26/2021 CLINICAL DATA:  Fall weakness EXAM: PORTABLE CHEST 1 VIEW COMPARISON:  Report 01/21/2015 FINDINGS: Single-view chest demonstrates no focal opacity or pleural effusion. Normal cardiomediastinal silhouette. No pneumothorax. IMPRESSION: No active disease. Electronically Signed   By: Jasmine Pang M.D.   On: 05/26/2021 22:54   CT Maxillofacial Wo Contrast  Result Date: 05/26/2021 CLINICAL DATA:  Mental status change right-sided weakness. EXAM: CT HEAD WITHOUT CONTRAST CT MAXILLOFACIAL WITHOUT CONTRAST CT CERVICAL SPINE WITHOUT CONTRAST TECHNIQUE: Multidetector CT imaging of the head, cervical spine, and maxillofacial structures were performed using the standard protocol without intravenous contrast. Multiplanar CT image reconstructions of the cervical spine and maxillofacial structures were also generated. COMPARISON:  None. FINDINGS: CT HEAD FINDINGS Brain: Bilateral parieto-occipital encephalomalacia noted with ex vacuo dilation the ventricles. Remote anterior right frontal lobe infarct present. Remote lacunar infarct is present right caudate head. Basal ganglia are otherwise intact.  Mild cerebellar atrophy noted. Hemorrhage or mass lesion is present. No significant extraaxial fluid collection is present. Vascular: No hyperdense vessel or unexpected calcification. Skull: Calvarium is intact. No focal lytic or blastic lesions are present. No significant extracranial soft tissue lesion is present. CT MAXILLOFACIAL FINDINGS Osseous: No acute or healing fractures are present. Mandible is intact and located. Patient is edentulous. Orbits: The globes and orbits are within normal limits. Sinuses: The paranasal sinuses and mastoid air cells are clear. Soft tissues: The soft tissues of face are unremarkable. CT CERVICAL SPINE FINDINGS Alignment: No significant listhesis is present. Straightening of the normal cervical lordosis is present. Skull base and vertebrae: Craniocervical junction is within normal limits. Vertebral body heights are maintained. No acute or healing fractures are present. Soft tissues and spinal canal: No prevertebral fluid or swelling. No visible canal hematoma. Disc levels: Uncovertebral and facet disease is present in the cervical spine. Foraminal narrowing is greatest at C3-4, right greater than left. Facet hypertrophy results in moderate left foraminal stenosis at C2-3. Upper chest: The lung apices are clear. Thoracic inlet is within normal limits. IMPRESSION: 1. 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 2. Remote anterior right frontal lobe infarct. 3. 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 4. No acute intracranial abnormality. 5. No acute or healing fractures of the face. 6. Multilevel degenerative changes of the cervical spine without acute fracture or traumatic subluxation. Electronically Signed   By: Marin Roberts M.D.   On: 05/26/2021 21:22      IMPRESSION AND PLAN:  Active Problems:   Dehydration  1.  Deconditioning with associated anorexia altered mental status and dehydration. - The patient  will be admitted to  a medical monitored bed. - She will be hydrated with IV normal saline. - PT consult be obtained to assess her ambulation. - We will follow neurochecks every 4 hours for 24 hours for the possibility of TIA.. -She has no clear lateralizing signs.  2.  Elevated lactic acid level with hypotension, tachypnea and tachycardia with leukocytosis.  She may be having SIRS.  She has no clear source of infection and has no fever or leukocytosis. - I agree with the ER physician at this time holding off on antibiotic therapy. - Leukocytosis could be related to stress demargination and elevated lactic acid to dehydration.  3.  Seizure disorder. - We will continue Depakote and Valium.  4.  Multiple sclerosis. - She has no current obvious flare.  5.  Chronic constipation. - We will continue her constipation medications.  DVT prophylaxis: Lovenox.  Code Status: full code.  Family Communication:  The plan of care was discussed in details with the patient (and family). I answered all questions. The patient agreed to proceed with the above mentioned plan. Further management will depend upon hospital course. Disposition Plan: Back to previous home environment Consults called: none.  All the records are reviewed and case discussed with ED provider.  Status is: Inpatient  Remains inpatient appropriate because:Altered mental status, Ongoing diagnostic testing needed not appropriate for outpatient work up, Unsafe d/c plan, IV treatments appropriate due to intensity of illness or inability to take PO, and Inpatient level of care appropriate due to severity of illness  Dispo: The patient is from: Group home              Anticipated d/c is to: Group home              Patient currently is not medically stable to d/c.   Difficult to place patient No   TOTAL TIME TAKING CARE OF THIS PATIENT: 55 minutes.    Hannah Beat M.D on 05/27/2021 at 4:41 AM  [facility]   From 7 PM-7 AM,  contact night-coverage [email or web address]  CC: Primary care physician; Lauro Regulus, MD

## 2021-05-28 ENCOUNTER — Inpatient Hospital Stay: Payer: Medicare Other

## 2021-05-28 LAB — CBC
HCT: 37.1 % (ref 36.0–46.0)
Hemoglobin: 12.5 g/dL (ref 12.0–15.0)
MCH: 35.7 pg — ABNORMAL HIGH (ref 26.0–34.0)
MCHC: 33.7 g/dL (ref 30.0–36.0)
MCV: 106 fL — ABNORMAL HIGH (ref 80.0–100.0)
Platelets: 177 10*3/uL (ref 150–400)
RBC: 3.5 MIL/uL — ABNORMAL LOW (ref 3.87–5.11)
RDW: 13.6 % (ref 11.5–15.5)
WBC: 16.4 10*3/uL — ABNORMAL HIGH (ref 4.0–10.5)
nRBC: 0 % (ref 0.0–0.2)

## 2021-05-28 LAB — BASIC METABOLIC PANEL
Anion gap: 6 (ref 5–15)
BUN: 13 mg/dL (ref 8–23)
CO2: 26 mmol/L (ref 22–32)
Calcium: 8.3 mg/dL — ABNORMAL LOW (ref 8.9–10.3)
Chloride: 109 mmol/L (ref 98–111)
Creatinine, Ser: 0.72 mg/dL (ref 0.44–1.00)
GFR, Estimated: >60 mL/min (ref >60)
Glucose, Bld: 86 mg/dL (ref 70–99)
Potassium: 4 mmol/L (ref 3.5–5.1)
Sodium: 141 mmol/L (ref 135–145)

## 2021-05-28 LAB — BLOOD GAS, ARTERIAL
Acid-Base Excess: 0.9 mmol/L (ref 0.0–2.0)
Bicarbonate: 24.2 mmol/L (ref 20.0–28.0)
FIO2: 21
O2 Saturation: 94.9 %
Patient temperature: 37
pCO2 arterial: 34 mmHg (ref 32.0–48.0)
pH, Arterial: 7.46 — ABNORMAL HIGH (ref 7.350–7.450)
pO2, Arterial: 71 mmHg — ABNORMAL LOW (ref 83.0–108.0)

## 2021-05-28 LAB — LACTIC ACID, PLASMA: Lactic Acid, Venous: 2.2 mmol/L (ref 0.5–1.9)

## 2021-05-28 MED ORDER — SODIUM CHLORIDE 0.9 % IV SOLN
2.0000 g | INTRAVENOUS | Status: DC
  Administered 2021-05-28 – 2021-05-30 (×3): 2 g via INTRAVENOUS
  Filled 2021-05-28: qty 2
  Filled 2021-05-28: qty 20
  Filled 2021-05-28: qty 2

## 2021-05-28 NOTE — Progress Notes (Signed)
PHARMACY - PHYSICIAN COMMUNICATION CRITICAL VALUE ALERT - BLOOD CULTURE IDENTIFICATION (BCID)  BCID results:  2 of 2 vials with E. Coli, no resistance.  Pt is not on any Abx at this time.  WBC 20.4, trending up.  Name of physician contacted: Andrez Grime, MD  Changes to prescribed antibiotics required: Ceftriaxone 2 gm q24h ordered.  Otelia Sergeant, PharmD, [facility] 05/28/2021 2:47 AM

## 2021-05-28 NOTE — Progress Notes (Signed)
PT Cancellation Note  Patient Details Name: Sabrina Johnston MRN: [id number] DOB: 1956/07/17   Cancelled Treatment:    Reason Eval/Treat Not Completed: Patient not medically ready  Patient's BP elevated at 160/124; Spoke with RN and he states MD is aware; RN requested hold as patient is not aware of situation and "out of it". She is also getting a CT and ABGs;  Will re-attempt PT when patient is medically ready and appropriate. Thank you for this referral.    Raffaella Edison PT, DPT 05/28/2021, 1:39 PM

## 2021-05-28 NOTE — Progress Notes (Signed)
PROGRESS NOTE    Sabrina Johnston  NKN:[id number] DOB: January 28, 1956 DOA: 05/26/2021 PCP: Kirk Ruths, MD   Brief Narrative: Taken from H&P. Sabrina Johnston is a 65 y.o. female with medical history significant for cognitive and neurobehavioral dysfunction, seizure disorder and chronic constipation, who presented to the emergency room with acute onset of generalized weakness and altered mental status.  The patient has not been ambulating over the last 24 hours.  She has not been eating or drinking over the last couple of days.  She is unable to provide any history due to her baseline mental status however according to the legal guardian who lives in [place] she is nonverbal at baseline but able to walk somewhat.  Also history of recent multiple falls.  She was mostly dependent for her ADLs.  Sometimes able to feed herself but need a lot of supervision.  Found to have mild leukocytosis, mildly elevated TSH, mild lactic acidosis in twos, UA with some bacteria and mild leukocytosis, urine and blood cultures pending Abdominal and chest x-ray without any acute abnormalities.  Pelvic x-rays with no fractures.  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  Blood culture came back positive for E. coli and she was started on ceftriaxone.  Lactic acid remained around 2.2.  Repeat CT head today was without any new abnormality.  Subjective: Patient was pretty much unresponsive when seen today, looks like sleeping comfortably.  Not responding to painful stimuli.  Vitals stable.  Assessment & Plan:   Active Problems:   Dehydration   Altered mental status   Hypotension  Altered mental status/unresponsiveness patient was pretty much unresponsive during morning rounds.  Obtained stat CT head which was without any acute abnormality,  similar changes were observed on prior imaging.  ABG with very mild hypoxia and no hypercarbia. There was some concern of decreased p.o. intake and dehydration.  Per legal guardian she was told that patient was not eating well and having multiple falls recently. Unable to explain any symptoms due to underlying severe cognitive impairment.  At baseline she is nonverbal and mostly dependent on others for ADLs. -Continue to monitor-sometimes patients with MS behave with increased somnolence. -Able to participate with OT later in the day. -Continue with maintenance fluid until she was able to take p.o.  Sepsis secondary to E. coli bacteremia.  Patient initially met SIRS criteria with leukocytosis, tachycardia and tachypnea and was not declared as septic as there was no obvious source of infection.  Now urine and blood cultures with E. coli so she met sepsis criteria.  He was started on ceftriaxone. -Monitor CBC -Follow-up urine and blood cultures-for susceptibility. -Continue with ceftriaxone -Monitor lactic acid  Seizure disorder. -Continue home dose of Depakote and Valium  Multiple sclerosis.  No obvious flare. -Continue to monitor  Physical deconditioning/multiple falls. -PT/OT evaluation  Chronic constipation. -Continue with bowel regimen  Objective: Vitals:   05/28/21 0057 05/28/21 0542 05/28/21 0936 05/28/21 1506  BP: (!) 118/106 135/90 (!) 160/124 (!) 123/50  Pulse: 94 87 71 87  Resp: '18 18 18 20  ' Temp: 98.7 F (37.1 C) 98.3 F (36.8 C) 97.6 F (36.4 C) 98 F (36.7 C)  TempSrc:   Axillary   SpO2: 92% 100% 91% 98%  Weight:      Height:        Intake/Output Summary (Last  24 hours) at 05/28/2021 1526 Last data filed at 05/28/2021 1402 Gross per 24 hour  Intake 2289.61 ml  Output 600 ml  Net 1689.61 ml    Filed Weights   05/26/21 2026 05/27/21 0626  Weight: 67.1 kg 66.7 kg    Examination:  General.  Cognitively impaired, unresponsive lady, in no acute  distress. Pulmonary.  Lungs clear bilaterally, normal respiratory effort. CV.  Regular rate and rhythm, no JVD, rub or murmur. Abdomen.  Soft, nontender, nondistended, BS positive. CNS.  Unresponsive and not responding to painful stimuli. Extremities.  No edema, no cyanosis, pulses intact and symmetrical.  DVT prophylaxis: Lovenox Code Status: Full Family Communication: Called legal guardian again with no response. Disposition Plan:  Status is: Inpatient  Remains inpatient appropriate because:Inpatient level of care appropriate due to severity of illness  Dispo: The patient is from: Group home              Anticipated d/c is to: Group home              Patient currently is not medically stable to d/c.   Difficult to place patient No              Level of care: Med-Surg  All the records are reviewed and case discussed with Care Management/Social Worker. Management plans discussed with the patient, nursing and they are in agreement.  Consultants:  None  Procedures:  Antimicrobials:  Ceftriaxone  Data Reviewed: I have personally reviewed following labs and imaging studies  CBC: Recent Labs  Lab 05/26/21 2040 05/27/21 0957 05/28/21 0529  WBC 13.1* 20.4* 16.4*  NEUTROABS 8.7*  --   --   HGB 13.2 11.6* 12.5  HCT 38.3 33.2* 37.1  MCV 110.1* 107.1* 106.0*  PLT 210 161 299    Basic Metabolic Panel: Recent Labs  Lab 05/26/21 2040 05/27/21 0957 05/28/21 0529  NA 136 136 141  K 5.0 4.3 4.0  CL 99 103 109  CO2 '25 25 26  ' GLUCOSE 145* 94 86  BUN '20 16 13  ' CREATININE 1.06* 0.81 0.72  CALCIUM 9.2 8.6* 8.3*    GFR: Estimated Creatinine Clearance: 61.4 mL/min (by C-G formula based on SCr of 0.72 mg/dL). Liver Function Tests: Recent Labs  Lab 05/26/21 2040  AST 39  ALT 18  ALKPHOS 87  BILITOT 0.7  PROT 6.4*  ALBUMIN 2.6*    No results for input(s): LIPASE, AMYLASE in the last 168 hours. Recent Labs  Lab 05/26/21 2040  AMMONIA 18    Coagulation  Profile: Recent Labs  Lab 05/27/21 0127  INR 1.1    Cardiac Enzymes: No results for input(s): CKTOTAL, CKMB, CKMBINDEX, TROPONINI in the last 168 hours. BNP (last 3 results) No results for input(s): PROBNP in the last 8760 hours. HbA1C: No results for input(s): HGBA1C in the last 72 hours. CBG: No results for input(s): GLUCAP in the last 168 hours. Lipid Profile: No results for input(s): CHOL, HDL, LDLCALC, TRIG, CHOLHDL, LDLDIRECT in the last 72 hours. Thyroid Function Tests: Recent Labs    05/26/21 2040  TSH 5.283*    Anemia Panel: No results for input(s): VITAMINB12, FOLATE, FERRITIN, TIBC, IRON, RETICCTPCT in the last 72 hours. Sepsis Labs: Recent Labs  Lab 05/26/21 2040 05/27/21 0005 05/27/21 0126 05/27/21 0957 05/27/21 1320 05/28/21 0529  PROCALCITON 0.38  --   --  0.27  --   --   LATICACIDVEN  --    < > 2.8* 2.2* 2.5* 2.2*   < > =  values in this interval not displayed.     Recent Results (from the past 240 hour(s))  Resp Panel by RT-PCR (Flu A&B, Covid)     Status: None   Collection Time: 05/26/21  8:40 PM   Specimen: Nasopharyngeal(NP) swabs in vial transport medium  Result Value Ref Range Status   SARS Coronavirus 2 by RT PCR NEGATIVE NEGATIVE Final    Comment: (NOTE) SARS-CoV-2 target nucleic acids are NOT DETECTED.  The SARS-CoV-2 RNA is generally detectable in upper respiratory specimens during the acute phase of infection. The lowest concentration of SARS-CoV-2 viral copies this assay can detect is 138 copies/mL. A negative result does not preclude SARS-Cov-2 infection and should not be used as the sole basis for treatment or other patient management decisions. A negative result may occur with  improper specimen collection/handling, submission of specimen other than nasopharyngeal swab, presence of viral mutation(s) within the areas targeted by this assay, and inadequate number of viral copies(<138 copies/mL). A negative result must be combined  with clinical observations, patient history, and epidemiological information. The expected result is Negative.  Fact Sheet for Patients:  EntrepreneurPulse.com.au  Fact Sheet for Healthcare Providers:  IncredibleEmployment.be  This test is no t yet approved or cleared by the [place] FDA and  has been authorized for detection and/or diagnosis of SARS-CoV-2 by FDA under an Emergency Use Authorization (EUA). This EUA will remain  in effect (meaning this test can be used) for the duration of the COVID-19 declaration under Section 564(b)(1) of the Act, 21 U.S.C.section 360bbb-3(b)(1), unless the authorization is terminated  or revoked sooner.       Influenza A by PCR NEGATIVE NEGATIVE Final   Influenza B by PCR NEGATIVE NEGATIVE Final    Comment: (NOTE) The Xpert Xpress SARS-CoV-2/FLU/RSV plus assay is intended as an aid in the diagnosis of influenza from Nasopharyngeal swab specimens and should not be used as a sole basis for treatment. Nasal washings and aspirates are unacceptable for Xpert Xpress SARS-CoV-2/FLU/RSV testing.  Fact Sheet for Patients: EntrepreneurPulse.com.au  Fact Sheet for Healthcare Providers: IncredibleEmployment.be  This test is not yet approved or cleared by the [place] FDA and has been authorized for detection and/or diagnosis of SARS-CoV-2 by FDA under an Emergency Use Authorization (EUA). This EUA will remain in effect (meaning this test can be used) for the duration of the COVID-19 declaration under Section 564(b)(1) of the Act, 21 U.S.C. section 360bbb-3(b)(1), unless the authorization is terminated or revoked.  Performed at [facility], [address] [place], [place] [address]   Urine Culture     Status: Abnormal (Preliminary result)   Collection Time: 05/26/21  8:40 PM   Specimen: Urine, Clean Catch  Result Value Ref Range Status   Specimen  Description   Final    URINE, CLEAN CATCH Performed at [facility], [address], [place], [place] [address]    Special Requests   Final    NONE Performed at [facility], [address] [place], [place] [address]    Culture >=100,000 COLONIES/mL ESCHERICHIA COLI (A)  Final   Report Status PENDING  Incomplete  Blood culture (routine single)     Status: None (Preliminary result)   Collection Time: 05/27/21  9:57 AM   Specimen: BLOOD  Result Value Ref Range Status   Specimen Description   Final    BLOOD BLOOD RIGHT ARM Performed at [facility], [address], [place], [place] [address]    Special Requests  Final    BOTTLES DRAWN AEROBIC AND ANAEROBIC Blood Culture adequate volume Performed at [facility], [address] [place], [place] [address]    Culture  Setup Time   Final    GRAM NEGATIVE RODS IN BOTH AEROBIC AND ANAEROBIC BOTTLES CRITICAL RESULT CALLED TO, READ BACK BY AND VERIFIED WITH: NATHAN BELUE @ 9390 05/27/21 LFD Performed at [facility], [place] [address] [place], [place] [address]    Culture GRAM NEGATIVE RODS  Final   Report Status PENDING  Incomplete  Blood Culture ID Panel (Reflexed)     Status: Abnormal (Preliminary result)   Collection Time: 05/27/21  9:57 AM  Result Value Ref Range Status   Enterococcus faecalis NOT DETECTED NOT DETECTED Final   Enterococcus Faecium NOT DETECTED NOT DETECTED Final   Listeria monocytogenes NOT DETECTED NOT DETECTED Final   Staphylococcus species NOT DETECTED NOT DETECTED Final   Staphylococcus aureus (BCID) PENDING NOT DETECTED Incomplete   Staphylococcus epidermidis PENDING NOT DETECTED Incomplete   Staphylococcus lugdunensis PENDING NOT DETECTED Incomplete   Streptococcus species NOT DETECTED NOT DETECTED Final   Streptococcus agalactiae NOT DETECTED NOT DETECTED Final   Streptococcus pneumoniae NOT DETECTED NOT DETECTED Final   Streptococcus pyogenes NOT DETECTED  NOT DETECTED Final   A.calcoaceticus-baumannii NOT DETECTED NOT DETECTED Final   Bacteroides fragilis NOT DETECTED NOT DETECTED Final   Enterobacterales DETECTED (A) NOT DETECTED Final    Comment: Enterobacterales represent a large order of gram negative bacteria, not a single organism. CRITICAL RESULT CALLED TO, READ BACK BY AND VERIFIED WITH: NATHAN BELUE @ 3300 05/27/21 LFD    Enterobacter cloacae complex NOT DETECTED NOT DETECTED Final   Escherichia coli DETECTED (A) NOT DETECTED Final    Comment: CRITICAL RESULT CALLED TO, READ BACK BY AND VERIFIED WITH: NATHAN BELUE @ 2320 05/27/21 LFD     Klebsiella aerogenes NOT DETECTED NOT DETECTED Final   Klebsiella oxytoca NOT DETECTED NOT DETECTED Final   Klebsiella pneumoniae NOT DETECTED NOT DETECTED Final   Proteus species NOT DETECTED NOT DETECTED Final   Salmonella species NOT DETECTED NOT DETECTED Final   Serratia marcescens NOT DETECTED NOT DETECTED Final   Haemophilus influenzae NOT DETECTED NOT DETECTED Final   Neisseria meningitidis NOT DETECTED NOT DETECTED Final   Pseudomonas aeruginosa NOT DETECTED NOT DETECTED Final   Stenotrophomonas maltophilia NOT DETECTED NOT DETECTED Final   Candida albicans NOT DETECTED NOT DETECTED Final   Candida auris NOT DETECTED NOT DETECTED Final   Candida glabrata NOT DETECTED NOT DETECTED Final   Candida krusei NOT DETECTED NOT DETECTED Final   Candida parapsilosis NOT DETECTED NOT DETECTED Final   Candida tropicalis NOT DETECTED NOT DETECTED Final   Cryptococcus neoformans/gattii NOT DETECTED NOT DETECTED Final   CTX-M ESBL NOT DETECTED NOT DETECTED Final   Carbapenem resistance IMP NOT DETECTED NOT DETECTED Final   Carbapenem resistance KPC NOT DETECTED NOT DETECTED Final   Carbapenem resistance NDM NOT DETECTED NOT DETECTED Final   Carbapenem resist OXA 48 LIKE NOT DETECTED NOT DETECTED Final   Carbapenem resistance VIM NOT DETECTED NOT DETECTED Final    Comment: Performed at [facility], [address] [place], [place] [address]      Radiology Studies: DG Abdomen 1 View  Result Date: 05/27/2021 CLINICAL DATA:  MRI screening. EXAM: ABDOMEN - 1 VIEW COMPARISON:  None. FINDINGS: The study is limited secondary to patient motion. The bowel gas pattern is normal. A moderate amount of stool is seen throughout  the colon. No radio-opaque calculi or other significant radiographic abnormality are seen. No radiopaque metallic density foci are seen. IMPRESSION: Negative. Electronically Signed   By: Virgina Norfolk M.D.   On: 05/27/2021 00:55   CT HEAD WO CONTRAST (5MM)  Result Date: 05/28/2021 CLINICAL DATA:  Mental status change, persistent or worsening. Cognitive and neuro behavioral dysfunction. Seizure disorder. EXAM: CT HEAD WITHOUT CONTRAST TECHNIQUE: Contiguous axial images were obtained from the base of the skull through the vertex without intravenous contrast. COMPARISON:  MRI yesterday.  CT 05/26/2021. FINDINGS: Brain: There is some sort of artifact on the axial imaging. This does not appear to be present on the coronal and sagittal reformations. No change or acute finding is appreciated. Old infarction in the right frontal lobe and right caudate head. Chronic ventriculomegaly and colpocephaly of both hemispheres, most likely related to prenatal or perinatal insult. No evidence of mass, hemorrhage, obstructive hydrocephalus or extra-axial collection. Vascular: No abnormal vascular finding. Skull: No significant calvarial finding. Sinuses/Orbits: Mucosal thickening of the maxillary sinuses right more than left. No layering fluid. Orbits negative. Other: None IMPRESSION: Some sort of artifact on the axial imaging, not seen on the sagittal or coronal imaging in therefore probably related to some sort of reconstruction problem. No apparent change since the MRI of yesterday or CT 2 days ago. 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 Chronic  ventriculomegaly without change. Old infarction in the right frontal lobe and right caudate. Electronically Signed   By: Nelson Chimes M.D.   On: 05/28/2021 12:15   CT HEAD WO CONTRAST (5MM)  Result Date: 05/26/2021 CLINICAL DATA:  Mental status change right-sided weakness. EXAM: CT HEAD WITHOUT CONTRAST CT MAXILLOFACIAL WITHOUT CONTRAST CT CERVICAL SPINE WITHOUT CONTRAST TECHNIQUE: Multidetector CT imaging of the head, cervical spine, and maxillofacial structures were performed using the standard protocol without intravenous contrast. Multiplanar CT image reconstructions of the cervical spine and maxillofacial structures were also generated. COMPARISON:  None. FINDINGS: CT HEAD FINDINGS Brain: Bilateral parieto-occipital encephalomalacia noted with ex vacuo dilation the ventricles. Remote anterior right frontal lobe infarct present. Remote lacunar infarct is present right caudate head. Basal ganglia are otherwise intact. Mild cerebellar atrophy noted. Hemorrhage or mass lesion is present. No significant extraaxial fluid collection is present. Vascular: No hyperdense vessel or unexpected calcification. Skull: Calvarium is intact. No focal lytic or blastic lesions are present. No significant extracranial soft tissue lesion is present. CT MAXILLOFACIAL FINDINGS Osseous: No acute or healing fractures are present. Mandible is intact and located. Patient is edentulous. Orbits: The globes and orbits are within normal limits. Sinuses: The paranasal sinuses and mastoid air cells are clear. Soft tissues: The soft tissues of face are unremarkable. CT CERVICAL SPINE FINDINGS Alignment: No significant listhesis is present. Straightening of the normal cervical lordosis is present. Skull base and vertebrae: Craniocervical junction is within normal limits. Vertebral body heights are maintained. No acute or healing fractures are present. Soft tissues and spinal canal: No prevertebral fluid or swelling. No visible canal hematoma.  Disc levels: Uncovertebral and facet disease is present in the cervical spine. Foraminal narrowing is greatest at C3-4, right greater than left. Facet hypertrophy results in moderate left foraminal stenosis at C2-3. Upper chest: The lung apices are clear. Thoracic inlet is within normal limits. IMPRESSION: 1. 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 2. Remote anterior right frontal lobe infarct. 3. 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 4. No acute intracranial abnormality. 5. No acute  or healing fractures of the face. 6. Multilevel degenerative changes of the cervical spine without acute fracture or traumatic subluxation. Electronically Signed   By: San Morelle M.D.   On: 05/26/2021 21:22   CT Cervical Spine Wo Contrast  Result Date: 05/26/2021 CLINICAL DATA:  Mental status change right-sided weakness. EXAM: CT HEAD WITHOUT CONTRAST CT MAXILLOFACIAL WITHOUT CONTRAST CT CERVICAL SPINE WITHOUT CONTRAST TECHNIQUE: Multidetector CT imaging of the head, cervical spine, and maxillofacial structures were performed using the standard protocol without intravenous contrast. Multiplanar CT image reconstructions of the cervical spine and maxillofacial structures were also generated. COMPARISON:  None. FINDINGS: CT HEAD FINDINGS Brain: Bilateral parieto-occipital encephalomalacia noted with ex vacuo dilation the ventricles. Remote anterior right frontal lobe infarct present. Remote lacunar infarct is present right caudate head. Basal ganglia are otherwise intact. Mild cerebellar atrophy noted. Hemorrhage or mass lesion is present. No significant extraaxial fluid collection is present. Vascular: No hyperdense vessel or unexpected calcification. Skull: Calvarium is intact. No focal lytic or blastic lesions are present. No significant extracranial soft tissue lesion is present. CT MAXILLOFACIAL FINDINGS Osseous: No acute or healing fractures are present.  Mandible is intact and located. Patient is edentulous. Orbits: The globes and orbits are within normal limits. Sinuses: The paranasal sinuses and mastoid air cells are clear. Soft tissues: The soft tissues of face are unremarkable. CT CERVICAL SPINE FINDINGS Alignment: No significant listhesis is present. Straightening of the normal cervical lordosis is present. Skull base and vertebrae: Craniocervical junction is within normal limits. Vertebral body heights are maintained. No acute or healing fractures are present. Soft tissues and spinal canal: No prevertebral fluid or swelling. No visible canal hematoma. Disc levels: Uncovertebral and facet disease is present in the cervical spine. Foraminal narrowing is greatest at C3-4, right greater than left. Facet hypertrophy results in moderate left foraminal stenosis at C2-3. Upper chest: The lung apices are clear. Thoracic inlet is within normal limits. IMPRESSION: 1. 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 2. Remote anterior right frontal lobe infarct. 3. 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 4. No acute intracranial abnormality. 5. No acute or healing fractures of the face. 6. Multilevel degenerative changes of the cervical spine without acute fracture or traumatic subluxation. Electronically Signed   By: San Morelle M.D.   On: 05/26/2021 21:22   MR BRAIN WO CONTRAST  Result Date: 05/27/2021 CLINICAL DATA:  Initial evaluation for right-sided weakness, decreased PO intake. EXAM: MRI HEAD WITHOUT CONTRAST TECHNIQUE: Multiplanar, multiecho pulse sequences of the brain and surrounding structures were obtained without intravenous contrast. COMPARISON:  CT from 05/26/2021. FINDINGS: Brain: Examination severely degraded by motion artifact, with several sequence markedly degraded and essentially nondiagnostic. 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 Marked thinning of the  overlying cortical mantle. Findings presumably chronic and congenital in nature. No visible transependymal flow of CSF on this limited exam. 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 The cerebellum and brainstem appear more normally formed. Focal area of probable encephalomalacia at the anterior right frontal lobe, suggesting prior right MCA distribution infarct. Associated remote lacunar infarct at the right caudate better seen on prior CT. No convincing foci of restricted diffusion to suggest acute or subacute ischemia. No other visible areas of chronic infarction. No convincing foci of susceptibility artifact to suggest acute or chronic intracranial hemorrhage. No visible mass lesion or mass effect. No midline shift. No visible extra-axial fluid collection. Vascular: Major intracranial vascular flow voids are poorly assessed on this limited  exam. Normal flow void is seen within the partially visualized basilar artery. Skull and upper cervical spine: Craniocervical junction not well assessed due to motion. Bone marrow signal intensity grossly within normal limits. No visible scalp soft tissue abnormality. Sinuses/Orbits: Globes and orbital soft tissues demonstrate no obvious abnormality. Paranasal sinuses are largely clear. Small chronic appearing right mastoid effusion noted. Other: None. IMPRESSION: 1. Severely limited exam due to extensive motion artifact. 2. No definite acute intracranial infarct or other abnormality identified. 3. 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 4. 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 Electronically Signed   By: Jeannine Boga M.D.   On: 05/27/2021 03:23   DG Pelvis Portable  Result Date: 05/26/2021 CLINICAL DATA:  Falls EXAM: PORTABLE PELVIS 1-2 VIEWS COMPARISON:  None. FINDINGS: There is no evidence of pelvic fracture or diastasis. No  pelvic bone lesions are seen. IMPRESSION: Negative. Electronically Signed   By: Donavan Foil M.D.   On: 05/26/2021 22:54   DG Chest Portable 1 View  Result Date: 05/26/2021 CLINICAL DATA:  Fall weakness EXAM: PORTABLE CHEST 1 VIEW COMPARISON:  Report 01/21/2015 FINDINGS: Single-view chest demonstrates no focal opacity or pleural effusion. Normal cardiomediastinal silhouette. No pneumothorax. IMPRESSION: No active disease. Electronically Signed   By: Donavan Foil M.D.   On: 05/26/2021 22:54   CT Maxillofacial Wo Contrast  Result Date: 05/26/2021 CLINICAL DATA:  Mental status change right-sided weakness. EXAM: CT HEAD WITHOUT CONTRAST CT MAXILLOFACIAL WITHOUT CONTRAST CT CERVICAL SPINE WITHOUT CONTRAST TECHNIQUE: Multidetector CT imaging of the head, cervical spine, and maxillofacial structures were performed using the standard protocol without intravenous contrast. Multiplanar CT image reconstructions of the cervical spine and maxillofacial structures were also generated. COMPARISON:  None. FINDINGS: CT HEAD FINDINGS Brain: Bilateral parieto-occipital encephalomalacia noted with ex vacuo dilation the ventricles. Remote anterior right frontal lobe infarct present. Remote lacunar infarct is present right caudate head. Basal ganglia are otherwise intact. Mild cerebellar atrophy noted. Hemorrhage or mass lesion is present. No significant extraaxial fluid collection is present. Vascular: No hyperdense vessel or unexpected calcification. Skull: Calvarium is intact. No focal lytic or blastic lesions are present. No significant extracranial soft tissue lesion is present. CT MAXILLOFACIAL FINDINGS Osseous: No acute or healing fractures are present. Mandible is intact and located. Patient is edentulous. Orbits: The globes and orbits are within normal limits. Sinuses: The paranasal sinuses and mastoid air cells are clear. Soft tissues: The soft tissues of face are unremarkable. CT CERVICAL SPINE FINDINGS Alignment: No  significant listhesis is present. Straightening of the normal cervical lordosis is present. Skull base and vertebrae: Craniocervical junction is within normal limits. Vertebral body heights are maintained. No acute or healing fractures are present. Soft tissues and spinal canal: No prevertebral fluid or swelling. No visible canal hematoma. Disc levels: Uncovertebral and facet disease is present in the cervical spine. Foraminal narrowing is greatest at C3-4, right greater than left. Facet hypertrophy results in moderate left foraminal stenosis at C2-3. Upper chest: The lung apices are clear. Thoracic inlet is within normal limits. IMPRESSION: 1. 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 2. Remote anterior right frontal lobe infarct. 3. 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 4. No acute intracranial abnormality. 5. No acute or healing fractures of the face. 6. Multilevel degenerative changes of the cervical spine without acute fracture or traumatic subluxation. Electronically Signed   By: San Morelle M.D.   On:  05/26/2021 21:22    Scheduled Meds:  alum & mag hydroxide-simeth  30 mL Oral TID AC & HS   calcium citrate-vitamin D   Oral BID   cholecalciferol  2,000 Units Oral Daily   divalproex  500 mg Oral BID   DULoxetine  90 mg Oral Daily   enoxaparin (LOVENOX) injection  40 mg Subcutaneous Q24H   loratadine  10 mg Oral Daily   polymixin-bacitracin   Topical BID   senna  2 tablet Oral BID   tolnaftate  1 application Topical BID   Continuous Infusions:  sodium chloride 100 mL/hr at 05/28/21 0700   cefTRIAXone (ROCEPHIN)  IV Stopped (05/28/21 0256)   lactated ringers       LOS: 1 day   Time spent: 45 minutes. More than 50% of the time was spent in counseling/coordination of care  Lorella Nimrod, MD [facility]  If 7PM-7AM, please contact night-coverage [email or web address]  05/28/2021, 3:26 PM   This record has been  created using Systems analyst. Errors have been sought and corrected,but may not always be located. Such creation errors do not reflect on the standard of care.

## 2021-05-28 NOTE — Progress Notes (Signed)
   05/28/21 0936  Assess: MEWS Score  Temp 97.6 F (36.4 C)  BP (!) 160/124  Pulse Rate 71  Resp 18  Level of Consciousness Unresponsive (vital signs stable as well as respiratory status)  SpO2 91 %  Assess: MEWS Score  MEWS Temp 0  MEWS Systolic 0  MEWS Pulse 0  MEWS RR 0  MEWS LOC 3  MEWS Score 3  MEWS Score Color Yellow  Assess: if the MEWS score is Yellow or Red  Were vital signs taken at a resting state? Yes  Focused Assessment No change from prior assessment  Does the patient meet 2 or more of the SIRS criteria? No  MEWS guidelines implemented *See Row Information* No, vital signs rechecked  Treat  MEWS Interventions Escalated (See documentation below)  Pain Scale PAINAD  Breathing 0  Negative Vocalization 0  Facial Expression 0  Body Language 0  Consolability 0  PAINAD Score 0  Take Vital Signs  Increase Vital Sign Frequency  Yellow: Q 2hr X 2 then Q 4hr X 2, if remains yellow, continue Q 4hrs  Escalate  MEWS: Escalate Yellow: discuss with charge nurse/RN and consider discussing with provider and RRT  Notify: Charge Nurse/RN  Name of Charge Nurse/RN Notified Theodosia Quay, RN  Date Charge Nurse/RN Notified 05/28/21  Time Charge Nurse/RN Notified 9242  Notify: Provider  Provider Name/Title Dr. Nelson Chimes  Date Provider Notified 05/28/21  Time Provider Notified 1000  Notification Type Face-to-face  Notification Reason Change in status  Provider response See new orders  Date of Provider Response 05/28/21  Time of Provider Response 1005  Document  Patient Outcome Other (Comment)  Progress note created (see row info) Yes  Assess: SIRS CRITERIA  SIRS Temperature  0  SIRS Pulse 0  SIRS Respirations  0  SIRS WBC 1  SIRS Score Sum  1

## 2021-05-28 NOTE — Evaluation (Signed)
Occupational Therapy Evaluation Patient Details Name: Sabrina Johnston MRN: [id number] DOB: April 22, 1956 Today's Date: 05/28/2021    History of Present Illness 65 y.o. female with medical history significant for cognitive and neurobehavioral dysfunction, seizure disorder and chronic constipation, who presented to the emergency room with acute onset of generalized weakness and altered mental status.  The patient has not been ambulating over the last 24 hours.  She has not been eating or drinking over the last couple of days.  She is unable to provide any history due to her baseline mental status however according to the legal guardian who lives in [place] she is nonverbal at baseline but able to walk somewhat.  Also history of recent multiple falls.  She was mostly dependent for her ADLs.  Sometimes able to feed herself but need a lot of supervision.     Found to have mild leukocytosis, mildly elevated TSH, mild lactic acidosis in twos, UA with some bacteria and mild leukocytosis, urine and blood cultures pending  Abdominal and chest x-ray without any acute abnormalities.  Pelvic x-rays with no fractures.  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   Clinical Impression   Patient presenting with decreased I in self care, balance, functional mobility/transfers, endurance, and safety awareness.Per chart review, pt lives in group home with assist for self care and mobility. She does feed herself with supervision and encouragement PTA. Self injurious behaviors at baseline. B hand mittens removed this session and pt makes no attempts to strike out. Pt keeps eyes closed throughout but does follow 1 step commands intermittently. Hand over hand assistance to wash face and hands. Repositioning in bed with total A. Patient will benefit from acute OT  to increase overall independence in the areas of ADLs, functional mobility, and safety awareness in order to safely discharge to next venue of care.    Follow Up Recommendations  SNF;Supervision/Assistance - 24 hour    Equipment Recommendations  Other (comment) (defer to next venue of care)       Precautions / Restrictions Precautions Precautions: Fall      Mobility Bed Mobility Overal bed mobility: Needs Assistance             General bed mobility comments: total A - pt is resistive to movement in bed. When repositions she places herself back in same position by scooting in bed.    Transfers                 General transfer comment: no attempted secondary to safety        ADL either performed or assessed with clinical judgement   ADL Overall ADL's : Needs assistance/impaired                                       General ADL Comments: Pt needs total hand over hand assistance for self care tasks at this time secondary to lethargy and decreased cognition.     Vision Patient Visual Report: No change from baseline              Pertinent Vitals/Pain Pain Assessment: Faces Faces Pain Scale: Hurts little more Pain Location: Pt does draw back and call out when repositioned in bed Pain Descriptors / Indicators: Discomfort Pain Intervention(s): Limited activity  within patient's tolerance;Monitored during session;Repositioned     Hand Dominance Right   Extremity/Trunk Assessment Upper Extremity Assessment Upper Extremity Assessment: Difficult to assess due to impaired cognition;Generalized weakness   Lower Extremity Assessment Lower Extremity Assessment: Difficult to assess due to impaired cognition;Generalized weakness       Communication Communication Communication: Other (comment) (non-verbal at baseline)   Cognition Arousal/Alertness: Lethargic Behavior During Therapy: Flat affect Overall Cognitive Status: History of cognitive  impairments - at baseline                                 General Comments: severe cognitive impairment at baseline per chart review. Pt does follow 1 step commands inconsistently this session. She does not open eyes during assessment and has drawn herself into fetal position.              Home Living Family/patient expects to be discharged to:: Unsure                                        Prior Functioning/Environment          Comments: Per chart review, pt lives in group home. She needs assist with ADLs and she ambulates with some assistance. She can feed herself with supervisoin and encouragement.        OT Problem List: Decreased strength;Pain;Decreased activity tolerance;Decreased safety awareness;Impaired balance (sitting and/or standing);Decreased knowledge of use of DME or AE;Decreased cognition      OT Treatment/Interventions: Self-care/ADL training;Balance training;Therapeutic exercise;Therapeutic activities;Energy conservation;Cognitive remediation/compensation;DME and/or AE instruction;Patient/family education;Manual therapy    OT Goals(Current goals can be found in the care plan section) Acute Rehab OT Goals Patient Stated Goal: none stated OT Goal Formulation: Patient unable to participate in goal setting Time For Goal Achievement: 06/11/21 Potential to Achieve Goals: Fair ADL Goals Pt Will Perform Eating: with set-up;with supervision;sitting Pt Will Perform Grooming: with set-up;with supervision;sitting Pt Will Transfer to Toilet: with min assist;ambulating Pt Will Perform Toileting - Clothing Manipulation and hygiene: with min assist;sit to/from stand  OT Frequency: Min 1X/week   Barriers to D/C:    none known at this time          AM-PAC OT "6 Clicks" Daily Activity     Outcome Measure Help from another person eating meals?: Total Help from another person taking care of personal grooming?: Total Help from another  person toileting, which includes using toliet, bedpan, or urinal?: Total Help from another person bathing (including washing, rinsing, drying)?: Total Help from another person to put on and taking off regular upper body clothing?: Total Help from another person to put on and taking off regular lower body clothing?: Total 6 Click Score: 6   End of Session Nurse Communication: Mobility status  Activity Tolerance: Patient limited by fatigue;Patient limited by lethargy;Other (comment) (limited by cognition) Patient left: in bed;with call bell/phone within reach;with bed alarm set  OT Visit Diagnosis: Muscle weakness (generalized) (M62.81);Unsteadiness on feet (R26.81)                Time: 5374-8270 OT Time Calculation (min): 19 min Charges:  OT General Charges $OT Visit: 1 Visit OT Evaluation $OT Eval Moderate Complexity: 1 Mod OT Treatments $Self Care/Home Management : 8-22 mins Jackquline Denmark, MS, OTR/L , CBIS ascom [phone]  05/28/21, 2:49 PM

## 2021-05-28 NOTE — TOC Progression Note (Signed)
Transition of Care [facility]) - Progression Note    Patient Details  Name: Sabrina Johnston MRN: [id number] Date of Birth: 1956/06/30  [facility]) CM/SW Contact  Bing Quarry, RN Phone Number: 05/28/2021, 1:22 PM  Clinical Narrative:8/14: Admitted to ED and from Group home , Severe Cognitive Impairments, Bacteremia/E. Coli. Self Injurious behaviors.  [facility] PCP: Dr. Einar Crow.  [facility] - [place], [place] - [address]  Contact information indicates [facility]:   [facility]tner Other     [phone]    Gabriel Cirri RN CM            Expected Discharge Plan and Services                                                 Social Determinants of Health (SDOH) Interventions    Readmission Risk Interventions No flowsheet data found.

## 2021-05-29 DIAGNOSIS — G9341 Metabolic encephalopathy: Secondary | ICD-10-CM

## 2021-05-29 DIAGNOSIS — G40909 Epilepsy, unspecified, not intractable, without status epilepticus: Secondary | ICD-10-CM

## 2021-05-29 LAB — CBC
HCT: 35.1 % — ABNORMAL LOW (ref 36.0–46.0)
Hemoglobin: 12.2 g/dL (ref 12.0–15.0)
MCH: 36 pg — ABNORMAL HIGH (ref 26.0–34.0)
MCHC: 34.8 g/dL (ref 30.0–36.0)
MCV: 103.5 fL — ABNORMAL HIGH (ref 80.0–100.0)
Platelets: 189 10*3/uL (ref 150–400)
RBC: 3.39 MIL/uL — ABNORMAL LOW (ref 3.87–5.11)
RDW: 13.9 % (ref 11.5–15.5)
WBC: 7.6 10*3/uL (ref 4.0–10.5)
nRBC: 0.9 % — ABNORMAL HIGH (ref 0.0–0.2)

## 2021-05-29 LAB — URINE CULTURE: Culture: >=100000 — AB

## 2021-05-29 LAB — VALPROIC ACID LEVEL: Valproic Acid Lvl: 48 ug/mL — ABNORMAL LOW (ref 50.0–100.0)

## 2021-05-29 LAB — LACTIC ACID, PLASMA: Lactic Acid, Venous: 1.4 mmol/L (ref 0.5–1.9)

## 2021-05-29 MED ORDER — HYDRALAZINE HCL 20 MG/ML IJ SOLN: 10.0000 mg | Freq: Four times a day (QID) | INTRAMUSCULAR | Status: DC | PRN

## 2021-05-29 MED ORDER — VALPROATE SODIUM 100 MG/ML IV SOLN
250.0000 mg | Freq: Four times a day (QID) | INTRAVENOUS | Status: DC
  Administered 2021-05-29 – 2021-05-30 (×5): 250 mg via INTRAVENOUS
  Filled 2021-05-29 (×9): qty 2.5

## 2021-05-29 NOTE — Procedures (Signed)
Patient is combative-cannot touch head very much. She grabbing my hands and in fetal position.  Try tomorrow.

## 2021-05-29 NOTE — TOC Initial Note (Signed)
Transition of Care [facility]) - Initial/Assessment Note    Patient Details  Name: Sabrina Johnston MRN: [id number] Date of Birth: 1955/11/17  Transition of Care [facility]) CM/SW Contact:    Allayne Butcher, RN Phone Number: 05/29/2021, 4:00 PM  Clinical Narrative:                 Patient is from [facility] on [address] in [place].  Patient is non verbal at baseline but per Melissa at [facility] reports that patient can ambulate and feed herself.  Patient has been extremely lethargic this hospital stay.   TOC will follow.  Expected Discharge Plan: Group Home Barriers to Discharge: Continued Medical Work up   Patient Goals and CMS Choice Patient states their goals for this hospitalization and ongoing recovery are:: From [facility], patient will return at discharge CMS Medicare.gov Compare Post Acute Care list provided to:: Patient Represenative (must comment) Choice offered to / list presented to : [facility] POA / Guardian  Expected Discharge Plan and Services Expected Discharge Plan: Group Home   Discharge Planning Services: CM Consult   Living arrangements for the past 2 months: Group Home                 DME Arranged: N/A DME Agency: NA       HH Arranged: NA          Prior Living Arrangements/Services Living arrangements for the past 2 months: Group Home Lives with:: Facility Resident Patient language and need for interpreter reviewed:: Yes Do you feel safe going back to the place where you live?: Yes      Need for Family Participation in Patient Care: Yes (Comment) Care giver support system in place?: Yes (comment)   Criminal Activity/Legal Involvement Pertinent to Current Situation/Hospitalization: No - Comment as needed  Activities of Daily Living   ADL Screening (condition at time of admission) Patient's cognitive ability adequate to safely complete daily activities?: No  Permission Sought/Granted Permission sought to share information with : Case  Manager, Magazine features editor, Guardian Permission granted to share information with : Yes, Verbal Permission Granted  Share Information with NAME: Shanda Bumps  Permission granted to share info w AGENCY: [facility]  Permission granted to share info w Relationship: sister     Emotional Assessment         Alcohol / Substance Use: Not Applicable Psych Involvement: Outpatient Provider  Admission diagnosis:  Dehydration [E86.0] Hypotension, unspecified hypotension type [I95.9] Altered mental status, unspecified altered mental status type [R41.82] Patient Active Problem List   Diagnosis Date Noted   Dehydration 05/27/2021   Altered mental status    Hypotension    Loose stools 10/25/2016   Menopause 10/25/2016   Chronic constipation 12/10/2014   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 [place] [address] Phone: [phone] Fax: [phone]     Social Determinants of Health (SDOH) Interventions    Readmission Risk Interventions No flowsheet data found.

## 2021-05-29 NOTE — Progress Notes (Signed)
PT Cancellation Note  Patient Details Name: Sabrina Johnston MRN: [id number] DOB: 25-May-1956   Cancelled Treatment:    Reason Eval/Treat Not Completed: Other (comment). Order received, pt chart reviewed. Pt appears to be in a deep sleep. PT was unable to arouse pt. When PT squeezed pt hand, she reflexively squeezed back. Otherwise, pt eyes remained closed. Notes from yesterday indicate a similar response. Will re-attempt PT when pt is aware, medically ready and appropriate.     Basilia Jumbo PT, DPT 05/29/21 9:23 AM [phone]

## 2021-05-29 NOTE — Progress Notes (Signed)
PROGRESS NOTE    Sabrina Johnston  YTK:[id number] DOB: 25-Sep-1956 DOA: 05/26/2021 PCP: Kirk Ruths, MD   Brief Narrative: Taken from H&P. Sabrina Johnston is a 65 y.o. female with medical history significant for cognitive and neurobehavioral dysfunction, seizure disorder and chronic constipation, who presented to the emergency room with acute onset of generalized weakness and altered mental status.  The patient has not been ambulating over the last 24 hours.  She has not been eating or drinking over the last couple of days.  She is unable to provide any history due to her baseline mental status however according to the legal guardian who lives in [place] she is nonverbal at baseline but able to walk somewhat.  Also history of recent multiple falls.  She was mostly dependent for her ADLs.  Sometimes able to feed herself but need a lot of supervision.  Found to have mild leukocytosis, mildly elevated TSH, mild lactic acidosis in twos, UA with some bacteria and mild leukocytosis, urine and blood cultures pending Abdominal and chest x-ray without any acute abnormalities.  Pelvic x-rays with no fractures.  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  Blood culture came back positive for E. coli and she was started on ceftriaxone.  Lactic acid remained around 2.2.  Repeat CT head was without any new abnormality.  According to her legal guardian at her facility she was sleeping most of the time since she came to that facility.  Normally to get up to eat but they have noticed that she was becoming more and more lethargic and does not want to eat for the past couple of weeks.  Subjective: Patient seen sleeping comfortably.  Does not open eyes on command.  With draw on painful stimuli.  Assessment & Plan:   Active Problems:    Dehydration   Altered mental status   Hypotension  Altered mental status.  Patient was sleeping most of the time for the past 2 days, not interested in eating or drinking although some response to painful stimuli.  Similar behavior at her group home per legal guardian.  Obtained stat CT head which was without any acute abnormality, similar changes were observed on prior imaging.  ABG with very mild hypoxia and no hypercarbia. There was some concern of decreased p.o. intake and dehydration.  Per legal guardian she was told that patient was not eating well and having multiple falls recently. Unable to explain any symptoms due to underlying severe cognitive impairment.  At baseline she is nonverbal and mostly dependent on others for ADLs. -Neurology consult to rule out any other underlying issue. -Continue to monitor-sometimes patients with MS behave with increased somnolence. -Able to participate with OT later in the day. -Continue with maintenance fluid until she was able to take p.o.  Sepsis secondary to E. coli bacteremia.  Patient initially met SIRS criteria with leukocytosis, tachycardia and tachypnea and was not declared as septic as there was no obvious source of infection.  Now urine and blood cultures with E. coli so she met sepsis criteria.  He was started on ceftriaxone. -Monitor CBC -Follow-up urine and blood cultures-for susceptibility, urine cultures are sensitive to ceftriaxone, blood cultures still pending, most likely will be the same. -Continue with ceftriaxone  Seizure disorder. -Continue home dose of Depakote and Valium-switch to IV today as unable to take p.o.  Multiple sclerosis.  No obvious flare. -Continue to monitor  Physical deconditioning/multiple falls. -PT/OT evaluation  Chronic constipation. -Continue with bowel regimen  Objective: Vitals:   05/29/21 0554 05/29/21 0814 05/29/21 1255 05/29/21 1604  BP: 112/75 (!) 141/70 128/67 (!) 116/56  Pulse: 92 83 68 73   Resp:  $Remo'18 18 18  'ibijS$ Temp: 98.7 F (37.1 C) 97.9 F (36.6 C) 97.7 F (36.5 C) 98.5 F (36.9 C)  TempSrc: Oral Axillary Axillary   SpO2: 100% 96% 96% 93%  Weight:      Height:        Intake/Output Summary (Last 24 hours) at 05/29/2021 1633 Last data filed at 05/29/2021 1026 Gross per 24 hour  Intake 552.4 ml  Output --  Net 552.4 ml    Filed Weights   05/26/21 2026 05/27/21 0626  Weight: 67.1 kg 66.7 kg    Examination:  [place] with severe cognitive impairment and nonverbal, in no acute distress. Pulmonary.  Lungs clear bilaterally, normal respiratory effort. CV.  Regular rate and rhythm, no JVD, rub or murmur. Abdomen.  Soft, nontender, nondistended, BS positive. CNS.  Appears sleeping, some withdrawing with painful stimulus Extremities.  No edema, no cyanosis, pulses intact and symmetrical.  DVT prophylaxis: Lovenox Code Status: Full Family Communication: Talked with legal guardian on phone. Disposition Plan:  Status is: Inpatient  Remains inpatient appropriate because:Inpatient level of care appropriate due to severity of illness  Dispo: The patient is from: Group home              Anticipated d/c is to: Group home              Patient currently is not medically stable to d/c.   Difficult to place patient No              Level of care: Med-Surg  All the records are reviewed and case discussed with Care Management/Social Worker. Management plans discussed with the patient, nursing and they are in agreement.  Consultants:  None  Procedures:  Antimicrobials:  Ceftriaxone  Data Reviewed: I have personally reviewed following labs and imaging studies  CBC: Recent Labs  Lab 05/26/21 2040 05/27/21 0957 05/28/21 0529 05/29/21 0703  WBC 13.1* 20.4* 16.4* 7.6  NEUTROABS 8.7*  --   --   --   HGB 13.2 11.6* 12.5 12.2  HCT 38.3 33.2* 37.1 35.1*  MCV 110.1* 107.1* 106.0* 103.5*  PLT 210 161 177 814    Basic Metabolic Panel: Recent Labs  Lab  05/26/21 2040 05/27/21 0957 05/28/21 0529  NA 136 136 141  K 5.0 4.3 4.0  CL 99 103 109  CO2 $Re'25 25 26  'Fxe$ GLUCOSE 145* 94 86  BUN $Re'20 16 13  'enG$ CREATININE 1.06* 0.81 0.72  CALCIUM 9.2 8.6* 8.3*    GFR: Estimated Creatinine Clearance: 61.4 mL/min (by C-G formula based on SCr of 0.72 mg/dL). Liver Function Tests: Recent Labs  Lab 05/26/21 2040  AST 39  ALT 18  ALKPHOS 87  BILITOT 0.7  PROT 6.4*  ALBUMIN 2.6*    No results for input(s): LIPASE, AMYLASE in the last 168 hours. Recent Labs  Lab 05/26/21 2040  AMMONIA 18    Coagulation Profile: Recent Labs  Lab 05/27/21 0127  INR 1.1    Cardiac Enzymes: No results for input(s): CKTOTAL, CKMB, CKMBINDEX, TROPONINI in the last 168 hours. BNP (last 3 results) No results for input(s): PROBNP in the last 8760 hours. HbA1C: No results for input(s): HGBA1C in the last  72 hours. CBG: No results for input(s): GLUCAP in the last 168 hours. Lipid Profile: No results for input(s): CHOL, HDL, LDLCALC, TRIG, CHOLHDL, LDLDIRECT in the last 72 hours. Thyroid Function Tests: Recent Labs    05/26/21 2040  TSH 5.283*    Anemia Panel: No results for input(s): VITAMINB12, FOLATE, FERRITIN, TIBC, IRON, RETICCTPCT in the last 72 hours. Sepsis Labs: Recent Labs  Lab 05/26/21 2040 05/27/21 0005 05/27/21 0957 05/27/21 1320 05/28/21 0529 05/29/21 0703  PROCALCITON 0.38  --  0.27  --   --   --   LATICACIDVEN  --    < > 2.2* 2.5* 2.2* 1.4   < > = values in this interval not displayed.     Recent Results (from the past 240 hour(s))  Resp Panel by RT-PCR (Flu A&B, Covid)     Status: None   Collection Time: 05/26/21  8:40 PM   Specimen: Nasopharyngeal(NP) swabs in vial transport medium  Result Value Ref Range Status   SARS Coronavirus 2 by RT PCR NEGATIVE NEGATIVE Final    Comment: (NOTE) SARS-CoV-2 target nucleic acids are NOT DETECTED.  The SARS-CoV-2 RNA is generally detectable in upper respiratory specimens during the  acute phase of infection. The lowest concentration of SARS-CoV-2 viral copies this assay can detect is 138 copies/mL. A negative result does not preclude SARS-Cov-2 infection and should not be used as the sole basis for treatment or other patient management decisions. A negative result may occur with  improper specimen collection/handling, submission of specimen other than nasopharyngeal swab, presence of viral mutation(s) within the areas targeted by this assay, and inadequate number of viral copies(<138 copies/mL). A negative result must be combined with clinical observations, patient history, and epidemiological information. The expected result is Negative.  Fact Sheet for Patients:  EntrepreneurPulse.com.au  Fact Sheet for Healthcare Providers:  IncredibleEmployment.be  This test is no t yet approved or cleared by the [place] FDA and  has been authorized for detection and/or diagnosis of SARS-CoV-2 by FDA under an Emergency Use Authorization (EUA). This EUA will remain  in effect (meaning this test can be used) for the duration of the COVID-19 declaration under Section 564(b)(1) of the Act, 21 U.S.C.section 360bbb-3(b)(1), unless the authorization is terminated  or revoked sooner.       Influenza A by PCR NEGATIVE NEGATIVE Final   Influenza B by PCR NEGATIVE NEGATIVE Final    Comment: (NOTE) The Xpert Xpress SARS-CoV-2/FLU/RSV plus assay is intended as an aid in the diagnosis of influenza from Nasopharyngeal swab specimens and should not be used as a sole basis for treatment. Nasal washings and aspirates are unacceptable for Xpert Xpress SARS-CoV-2/FLU/RSV testing.  Fact Sheet for Patients: EntrepreneurPulse.com.au  Fact Sheet for Healthcare Providers: IncredibleEmployment.be  This test is not yet approved or cleared by the [place] FDA and has been authorized for detection and/or  diagnosis of SARS-CoV-2 by FDA under an Emergency Use Authorization (EUA). This EUA will remain in effect (meaning this test can be used) for the duration of the COVID-19 declaration under Section 564(b)(1) of the Act, 21 U.S.C. section 360bbb-3(b)(1), unless the authorization is terminated or revoked.  Performed at [facility], [address], [place], [place] [address]   Urine Culture     Status: Abnormal   Collection Time: 05/26/21  8:40 PM   Specimen: Urine, Clean Catch  Result Value Ref Range Status   Specimen Description   Final    URINE, CLEAN CATCH Performed at [facility], [address] [place], [place] [address]    Special Requests   Final    NONE Performed at [facility], [address] [place], [place] [address]    Culture >=100,000 COLONIES/mL ESCHERICHIA COLI (A)  Final   Report Status 05/29/2021 FINAL  Final   Organism ID, Bacteria ESCHERICHIA COLI (A)  Final      Susceptibility   Escherichia coli - MIC*    AMPICILLIN >=32 RESISTANT Resistant     CEFAZOLIN <=4 SENSITIVE Sensitive     CEFEPIME <=0.12 SENSITIVE Sensitive     CEFTRIAXONE <=0.25 SENSITIVE Sensitive     CIPROFLOXACIN <=0.25 SENSITIVE Sensitive     GENTAMICIN >=16 RESISTANT Resistant     IMIPENEM <=0.25 SENSITIVE Sensitive     NITROFURANTOIN <=16 SENSITIVE Sensitive     TRIMETH/SULFA >=320 RESISTANT Resistant     AMPICILLIN/SULBACTAM >=32 RESISTANT Resistant     PIP/TAZO <=4 SENSITIVE Sensitive     * >=100,000 COLONIES/mL ESCHERICHIA COLI  Blood culture (routine single)     Status: Abnormal (Preliminary result)   Collection Time: 05/27/21  9:57 AM   Specimen: BLOOD  Result Value Ref Range Status   Specimen Description   Final    BLOOD BLOOD RIGHT ARM Performed at [facility], [address] [place], [place] [address]    Special Requests   Final    BOTTLES DRAWN AEROBIC AND ANAEROBIC Blood Culture adequate volume Performed at [facility], [address] [place], [place] [address]    Culture  Setup Time   Final    GRAM NEGATIVE RODS IN BOTH AEROBIC AND ANAEROBIC BOTTLES CRITICAL RESULT CALLED TO, READ BACK BY AND VERIFIED WITH: NATHAN BELUE @ 1601 05/27/21 LFD    Culture (A)  Final    ESCHERICHIA COLI SUSCEPTIBILITIES TO FOLLOW Performed at [facility], [place] [address], [place], [place] [address]    Report Status PENDING  Incomplete  Blood Culture ID Panel (Reflexed)     Status: Abnormal (Preliminary result)   Collection Time: 05/27/21  9:57 AM  Result Value Ref Range Status   Enterococcus faecalis NOT DETECTED NOT DETECTED Final   Enterococcus Faecium NOT DETECTED NOT DETECTED Final   Listeria monocytogenes NOT DETECTED NOT DETECTED Final   Staphylococcus species NOT DETECTED NOT DETECTED Final   Staphylococcus aureus (BCID) PENDING NOT DETECTED Incomplete   Staphylococcus epidermidis PENDING NOT DETECTED Incomplete   Staphylococcus lugdunensis PENDING NOT DETECTED Incomplete   Streptococcus species NOT DETECTED NOT DETECTED Final   Streptococcus agalactiae NOT DETECTED NOT DETECTED Final   Streptococcus pneumoniae NOT DETECTED NOT DETECTED Final   Streptococcus pyogenes NOT DETECTED NOT DETECTED Final   A.calcoaceticus-baumannii NOT DETECTED NOT DETECTED Final   Bacteroides fragilis NOT DETECTED NOT DETECTED Final   Enterobacterales DETECTED (A) NOT DETECTED Final    Comment: Enterobacterales represent a large order of gram negative bacteria, not a single organism. CRITICAL RESULT CALLED TO, READ BACK BY AND VERIFIED WITH: NATHAN BELUE @ 5573 05/27/21 LFD    Enterobacter cloacae complex NOT DETECTED NOT DETECTED Final   Escherichia coli DETECTED (A) NOT DETECTED Final    Comment: CRITICAL RESULT CALLED TO, READ BACK BY AND VERIFIED WITH: NATHAN BELUE @ 2202 05/27/21 LFD     Klebsiella aerogenes NOT DETECTED NOT DETECTED Final   Klebsiella oxytoca NOT DETECTED NOT DETECTED Final   Klebsiella  pneumoniae NOT DETECTED NOT DETECTED Final   Proteus species NOT DETECTED NOT DETECTED Final   Salmonella species NOT  DETECTED NOT DETECTED Final   Serratia marcescens NOT DETECTED NOT DETECTED Final   Haemophilus influenzae NOT DETECTED NOT DETECTED Final   Neisseria meningitidis NOT DETECTED NOT DETECTED Final   Pseudomonas aeruginosa NOT DETECTED NOT DETECTED Final   Stenotrophomonas maltophilia NOT DETECTED NOT DETECTED Final   Candida albicans NOT DETECTED NOT DETECTED Final   Candida auris NOT DETECTED NOT DETECTED Final   Candida glabrata NOT DETECTED NOT DETECTED Final   Candida krusei NOT DETECTED NOT DETECTED Final   Candida parapsilosis NOT DETECTED NOT DETECTED Final   Candida tropicalis NOT DETECTED NOT DETECTED Final   Cryptococcus neoformans/gattii NOT DETECTED NOT DETECTED Final   CTX-M ESBL NOT DETECTED NOT DETECTED Final   Carbapenem resistance IMP NOT DETECTED NOT DETECTED Final   Carbapenem resistance KPC NOT DETECTED NOT DETECTED Final   Carbapenem resistance NDM NOT DETECTED NOT DETECTED Final   Carbapenem resist OXA 48 LIKE NOT DETECTED NOT DETECTED Final   Carbapenem resistance VIM NOT DETECTED NOT DETECTED Final    Comment: Performed at [facility], [address] [place], [place] [address]      Radiology Studies: CT HEAD WO CONTRAST (5MM)  Result Date: 05/28/2021 CLINICAL DATA:  Mental status change, persistent or worsening. Cognitive and neuro behavioral dysfunction. Seizure disorder. EXAM: CT HEAD WITHOUT CONTRAST TECHNIQUE: Contiguous axial images were obtained from the base of the skull through the vertex without intravenous contrast. COMPARISON:  MRI yesterday.  CT 05/26/2021. FINDINGS: Brain: There is some sort of artifact on the axial imaging. This does not appear to be present on the coronal and sagittal reformations. No change or acute finding is appreciated. Old infarction in the right frontal lobe and right caudate head. Chronic  ventriculomegaly and colpocephaly of both hemispheres, most likely related to prenatal or perinatal insult. No evidence of mass, hemorrhage, obstructive hydrocephalus or extra-axial collection. Vascular: No abnormal vascular finding. Skull: No significant calvarial finding. Sinuses/Orbits: Mucosal thickening of the maxillary sinuses right more than left. No layering fluid. Orbits negative. Other: None IMPRESSION: Some sort of artifact on the axial imaging, not seen on the sagittal or coronal imaging in therefore probably related to some sort of reconstruction problem. No apparent change since the MRI of yesterday or CT 2 days ago. 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 Chronic ventriculomegaly without change. Old infarction in the right frontal lobe and right caudate. Electronically Signed   By: Nelson Chimes M.D.   On: 05/28/2021 12:15    Scheduled Meds:  alum & mag hydroxide-simeth  30 mL Oral TID AC & HS   calcium citrate-vitamin D   Oral BID   cholecalciferol  2,000 Units Oral Daily   DULoxetine  90 mg Oral Daily   enoxaparin (LOVENOX) injection  40 mg Subcutaneous Q24H   loratadine  10 mg Oral Daily   polymixin-bacitracin   Topical BID   senna  2 tablet Oral BID   Continuous Infusions:  sodium chloride 100 mL/hr at 05/29/21 0609   cefTRIAXone (ROCEPHIN)  IV Stopped (05/29/21 0239)   valproate sodium 250 mg (05/29/21 0951)     LOS: 2 days   Time spent: 40 minutes. More than 50% of the time was spent in counseling/coordination of care  Lorella Nimrod, MD [facility]  If 7PM-7AM, please contact night-coverage [email or web address]  05/29/2021, 4:33 PM   This record has been created using Systems analyst. Errors have been sought and corrected,but may not always be located. Such creation errors do  not reflect on the standard of care.

## 2021-05-29 NOTE — Progress Notes (Signed)
Neurology Interim Note  Neurology consulted for acute/subacute encephalopathy in this patient who presented with E. Coli urosepsis with bacteremia. On my examination she was somnolent and mildly resistant to exam, would not follow commands, although her exam improved by this evening and she was following some simple commands per RN report. She has baseline cognitive and neurobehavioral dysfunction and is nonverbal requiring assistance with most ADLs at baseline. Suspect encephalopathy 2/2 infection which is now being treated with ceftriaxone. Improvement after starting abx is reassuring although she is not yet back to baseline. Will re-examine tomorrow. Given her hx seizure disorder and current infection which would decrease seizure threshold I did order EEG which was unable to be performed 2/2 agitation. Will re-attempt EEG tomorrow. In the meantime, continue home depakote and valium parenteral formulation. Depakote level is pending. No additional workup indicated at this time. Full consult note to follow.  Bing Neighbors, MD [facility] [phone]  If 7pm- 7am, please page neurology on call as listed in AMION.

## 2021-05-29 NOTE — Progress Notes (Signed)
Patient following a few commands now. Patient becomes agitated with much manipulation of limbs. Continues to refuse attempts at eating or drinking. Per group home, is non verbal at baseline

## 2021-05-29 NOTE — Progress Notes (Signed)
SLP Cancellation Note  Patient Details Name: Sabrina Johnston MRN: [id number] DOB: 1956-06-06   Cancelled treatment:       Reason Eval/Treat Not Completed: Fatigue/lethargy limiting ability to participate;Patient's level of consciousness (chart reviewed; consulted NSG re: pt's status today). Pt did not arouse and eyes remained closed when given verbal/tactile stim. NSG reported pt has not taken po's w/ her today. ST services will f/u in the morning w/ a meal to hopefully assess swallowing/function in a more natural setting w/ a meal. NSG agreed.      Jerilynn Som, MS, CCC-SLP Speech Language Pathologist Rehab Services [phone] [facility] 05/29/2021, 2:31 PM

## 2021-05-30 DIAGNOSIS — G40909 Epilepsy, unspecified, not intractable, without status epilepticus: Secondary | ICD-10-CM

## 2021-05-30 DIAGNOSIS — R6520 Severe sepsis without septic shock: Secondary | ICD-10-CM

## 2021-05-30 DIAGNOSIS — A4151 Sepsis due to Escherichia coli [E. coli]: Principal | ICD-10-CM

## 2021-05-30 DIAGNOSIS — G9341 Metabolic encephalopathy: Secondary | ICD-10-CM

## 2021-05-30 DIAGNOSIS — R7881 Bacteremia: Secondary | ICD-10-CM

## 2021-05-30 LAB — CULTURE, BLOOD (SINGLE): Special Requests: ADEQUATE

## 2021-05-30 MED ORDER — VALPROATE SODIUM 100 MG/ML IV SOLN
300.0000 mg | Freq: Four times a day (QID) | INTRAVENOUS | Status: DC
  Filled 2021-05-30 (×3): qty 3

## 2021-05-30 MED ORDER — VALPROATE SODIUM 100 MG/ML IV SOLN
250.0000 mg | Freq: Four times a day (QID) | INTRAVENOUS | Status: DC
  Administered 2021-05-31 – 2021-06-01 (×6): 250 mg via INTRAVENOUS
  Filled 2021-05-30 (×8): qty 2.5

## 2021-05-30 MED ORDER — LORAZEPAM 2 MG/ML IJ SOLN
2.0000 mg | Freq: Once | INTRAMUSCULAR | Status: AC
  Administered 2021-05-30: 12:00:00 1 mg via INTRAVENOUS

## 2021-05-30 MED ORDER — LORAZEPAM 2 MG/ML IJ SOLN
2.0000 mg | Freq: Once | INTRAMUSCULAR | Status: AC
  Filled 2021-05-30: qty 1

## 2021-05-30 MED ORDER — CEFAZOLIN SODIUM-DEXTROSE 2-4 GM/100ML-% IV SOLN
2.0000 g | Freq: Three times a day (TID) | INTRAVENOUS | Status: DC
  Administered 2021-05-30 – 2021-06-01 (×4): 2 g via INTRAVENOUS
  Filled 2021-05-30 (×9): qty 100

## 2021-05-30 MED ORDER — LORAZEPAM 2 MG/ML IJ SOLN
INTRAMUSCULAR | Status: AC
  Administered 2021-05-30: 12:00:00 2 mg via INTRAVENOUS
  Filled 2021-05-30: qty 1

## 2021-05-30 NOTE — Evaluation (Signed)
Clinical/Bedside Swallow Evaluation Patient Details  Name: Sabrina Johnston MRN: [id number] Date of Birth: Jun 09, 1956  Today's Date: 05/30/2021 Time: SLP Start Time (ACUTE ONLY): 0910 SLP Stop Time (ACUTE ONLY): 1010 SLP Time Calculation (min) (ACUTE ONLY): 60 min  Past Medical History:  Past Medical History:  Diagnosis Date   Menopause    Mental retardation    PMB (postmenopausal bleeding)    Past Surgical History:  Past Surgical History:  Procedure Laterality Date   HYSTEROSCOPY WITH D & C  2013   b9 endometrium wiht polyps   HPI:  Pt is a 65 y.o. female with medical history significant for cognitive and neurobehavioral dysfunction, seizure disorder and chronic constipation, who presented to the emergency room with acute onset of generalized weakness and altered mental status.  The patient has not been ambulating over the last 24 hours.  She has not been eating or drinking over the last couple of days.  She is unable to provide any history due to her baseline mental status however per the staff at her group home she was able to speak and mention that she had a couple falls during the last couple of days.  She was noted to be hitting herself in the face more than usual in the last couple of days.  She has subsequent contusions to her face and left ear.  No nausea or vomiting.  MRI: Severely limited exam due to extensive motion artifact.  2. No definite acute intracranial infarct or other abnormality  identified.  3. 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  4. 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  CXR: No active disease.   Assessment / Plan / Recommendation Clinical Impression  Pt appears to present w/ Mild+ oral phase dysphagia, moreso during oral prep and awareness, in light of declined Cognitive status, Baseline suspected. Pt appears also Edentulous. This overall  presentation can impact her overall awareness/timing of swallow and safety during po tasks which increases risk for aspiration, choking. Pt's risk for aspiration is present but can be reduced when following general aspiration precautions, given some assistance and Supervision at meals, and when using a modified diet consistency for ease of mashing/gumming foods. She required verbal/visual cues during po tasks and feeding -- given presentation of the cup or food, pt then allowed for feeding or held the cup to drink.    Pt consumed several trials of purees and thin liquids via CUP; No immediate, overt clinical s/s of aspiration noted w/ trials/consistencies; no cough, and no decline in respiratory status during/post all trials. Oral phase was c/b reduced oral prep awareness for bolus management and munching/mashing/gumming behavior d/t Edentulous status. Pt was quick to swallow some trials w/out mashing/gumming the food trial. Oral clearing noted w/ boluses given. Pt attempted self-feeding by [employer] but required Mod support and guidance d/t Cognitive decline(spillage and impulsive drinking occurred). OM Exam could not be performed. No unilateral oral weakness noted.      D/t pt's Baseline, declined Cognitive status w/ Oral Phase Dysphagia and risk for aspiration, recommend a more dysphagia level 3(mech soft) w/ thin liquids via CUP; aspiration precautions esp. w/ foods during the oral phase; reduce Distractions during meals and engage pt during po's at meal for self-feeding. Pills Crushed in Puree for safer swallowing. Support w/ feeding at meals as needed. MD/NSG updated. ST services can follow pt at discharge for further education as needed -- pt  appears at her Baseline from a dysphagia standpoint. Largely suspect impact from the Cognitive decline on the oral phase and overall intake. Recommend Dietician f/u if appropriate. SLP Visit Diagnosis: Dysphagia, oral phase (R13.11) (cognitive decline Baseline)     Aspiration Risk  Mild aspiration risk;Risk for inadequate nutrition/hydration    Diet Recommendation  Dysphagia level 3(mech soft) w/ thin liquids via CUP; aspiration precautions esp. w/ foods during the oral phase; reduce Distractions during meals and engage pt during po's at meal for self-feeding. Support w/ feeding at meals as needed monitoring for Impulsive feeding behaviors and limiting.  Medication Administration: Crushed with puree    Other  Recommendations Recommended Consults:  (Dietician f/u) Oral Care Recommendations: Oral care BID;Oral care before and after PO;Staff/trained caregiver to provide oral care Other Recommendations:  (n/a)   Follow up Recommendations None      Frequency and Duration  (n/a)   (n/a)       Prognosis Prognosis for Safe Diet Advancement: Fair Barriers to Reach Goals: Cognitive deficits;Language deficits;Severity of deficits;Behavior;Time post onset      Swallow Study   General Date of Onset: 05/26/21 HPI: Pt is a 65 y.o. female with medical history significant for cognitive and neurobehavioral dysfunction, seizure disorder and chronic constipation, who presented to the emergency room with acute onset of generalized weakness and altered mental status.  The patient has not been ambulating over the last 24 hours.  She has not been eating or drinking over the last couple of days.  She is unable to provide any history due to her baseline mental status however per the staff at her group home she was able to speak and mention that she had a couple falls during the last couple of days.  She was noted to be hitting herself in the face more than usual in the last couple of days.  She has subsequent contusions to her face and left ear.  No nausea or vomiting.  MRI: Severely limited exam due to extensive motion artifact.  2. No definite acute intracranial infarct or other abnormality  identified.  3. 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  4. 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  CXR: No active disease. Type of Study: Bedside Swallow Evaluation Previous Swallow Assessment: unknown Diet Prior to this Study: Regular;Thin liquids Temperature Spikes Noted: No (wbc 7.6) Respiratory Status: Room air History of Recent Intubation: No Behavior/Cognition: Alert;Cooperative;Pleasant mood;Confused;Distractible;Requires cueing;Doesn't follow directions (Nonverbal.  Participated given Time and support, model.) Oral Cavity Assessment:  (could not assess) Oral Care Completed by SLP: Recent completion by staff Oral Cavity - Dentition: Edentulous (appeared) Vision: Functional for self-feeding Self-Feeding Abilities: Able to feed self;Needs assist;Needs set up;Total assist (help Cup to drink) Patient Positioning: Postural control adequate for testing Baseline Vocal Quality:  (nonverbal) Volitional Cough: Cognitively unable to elicit Volitional Swallow: Unable to elicit    Oral/Motor/Sensory Function Overall Oral Motor/Sensory Function:  (appeared grossly WFL for bolus management and clearing)   Ice Chips Ice chips: Not tested   Thin Liquid Thin Liquid: Impaired (oral prep stage) Presentation: Cup;Self Fed (supported fully d/t impulsivity and spilling; 6 ozs) Oral Phase Impairments: Poor awareness of bolus (gulping sips) Pharyngeal  Phase Impairments:  (none)    Nectar Thick Nectar Thick Liquid: Not tested   Honey Thick Honey Thick Liquid: Not tested   Puree Puree: Within functional limits Presentation: Spoon (fed; 6 trials) Oral Phase Impairments:  (  oral prep) Pharyngeal Phase Impairments:  (none)   Solid     Solid: Impaired (oral prep; oral phase - edentulous also) Presentation: Spoon (fed; 10 trials) Oral Phase Impairments: Impaired mastication Pharyngeal Phase Impairments:  (none)         Jerilynn Som, MS, [employer] Speech  Language Pathologist [facility] [phone] [facility] 05/30/2021,12:02 PM

## 2021-05-30 NOTE — Progress Notes (Signed)
OT Cancellation Note  Patient Details Name: Sabrina Johnston MRN: [id number] DOB: [birth year]   Cancelled Treatment:    Reason Eval/Treat Not Completed: Fatigue/lethargy limiting ability to participate. Per discussion with RN, pt lethargic following receiving Ativan prior to EEG. OT to re-attempt at later time/date as able.   Matthew Folks, OTR/L [facility] [phone]

## 2021-05-30 NOTE — Progress Notes (Signed)
PROGRESS NOTE    JILLIEN Johnston  QAS:[id number] DOB: 1955/11/01 DOA: 05/26/2021 PCP: Kirk Ruths, MD   Brief Narrative: Taken from H&P. Sabrina Johnston is a 65 y.o. female with medical history significant for cognitive and neurobehavioral dysfunction, seizure disorder and chronic constipation, who presented to the emergency room with acute onset of generalized weakness and altered mental status.  The patient has not been ambulating over the last 24 hours.  She has not been eating or drinking over the last couple of days.  She is unable to provide any history due to her baseline mental status however according to the legal guardian who lives in [place] she is nonverbal at baseline but able to walk somewhat.  Also history of recent multiple falls.  She was mostly dependent for her ADLs.  Sometimes able to feed herself but need a lot of supervision.  Found to have mild leukocytosis, mildly elevated TSH, mild lactic acidosis in twos, UA with some bacteria and mild leukocytosis, urine and blood cultures pending Abdominal and chest x-ray without any acute abnormalities.  Pelvic x-rays with no fractures.  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  Blood culture came back positive for E. coli and she was started on ceftriaxone.  Lactic acid remained around 2.2.  Repeat CT head was without any new abnormality.  According to her legal guardian at her facility she was sleeping most of the time since she came to that facility.  Normally to get up to eat but they have noticed that she was becoming more and more lethargic and does not want to eat for the past couple of weeks.  Neurology was also consulted-EEG done today with Ativan as she became combative yesterday during EEG.  Pending results.  Subjective: Patient seems little  more alert today, she gave me a smile but not following any other commands.  She open eyes when called her name.  Per nursing staff she was able to eat part of her breakfast this morning.  Received Ativan later on for EEG.  Assessment & Plan:   Active Problems:   Dehydration   Altered mental status   Hypotension   Sepsis due to Escherichia coli with encephalopathy (HCC)   Seizure disorder (HCC)  Altered mental status.  Patient was sleeping most of the time for the past 2 days, not interested in eating or drinking although some response to painful stimuli.  Similar behavior at her group home per legal guardian.  Obtained stat CT head which was without any acute abnormality, similar changes were observed on prior imaging.  ABG with very mild hypoxia and no hypercarbia. There was some concern of decreased p.o. intake and dehydration.  Per legal guardian she was told that patient was not eating well and having multiple falls recently. Unable to explain any symptoms due to underlying severe cognitive impairment.  At baseline she is nonverbal and mostly dependent on others for ADLs. -Neurology consult to rule out any other underlying issue-they advised EEG which was done today-pending results -Continue to monitor-sometimes patients with MS behave with increased somnolence. -Continue with maintenance fluid until she was able to take p.o.  Sepsis secondary to E. coli bacteremia.  Patient initially met SIRS criteria with leukocytosis, tachycardia and tachypnea and was not declared as septic as there was no obvious source of infection. Later urine and  blood cultures with E. coli so she met sepsis criteria.  She was started on ceftriaxone. -Monitor CBC -Follow-up urine and blood cultures-for susceptibility, urine and blood cultures are sensitive to ceftriaxone, resistant to ampicillin, Augmentin, gentamicin and Bactrim. -Continue with ceftriaxone  Seizure disorder. -Continue home dose of Depakote and  Valium-switch to IV as unable to take p.o.  Multiple sclerosis.  No obvious flare. -Continue to monitor  Physical deconditioning/multiple falls. -PT/OT evaluation  Chronic constipation. -Continue with bowel regimen  Objective: Vitals:   05/30/21 0051 05/30/21 0542 05/30/21 0821 05/30/21 1557  BP: (!) 107/59 (!) 110/57 109/63 125/65  Pulse: 85 70 67 63  Resp: '17 18 17 18  ' Temp: 97.8 F (36.6 C) 98.4 F (36.9 C) (!) 97.4 F (36.3 C) (!) 97.5 F (36.4 C)  TempSrc:      SpO2: 90% 96% 97% 99%  Weight:      Height:        Intake/Output Summary (Last 24 hours) at 05/30/2021 1603 Last data filed at 05/30/2021 1330 Gross per 24 hour  Intake 254.16 ml  Output --  Net 254.16 ml    Filed Weights   05/26/21 2026 05/27/21 0626  Weight: 67.1 kg 66.7 kg    Examination:  General.  Cognitively impaired, nonverbal lady, in no acute distress. Pulmonary.  Lungs clear bilaterally, normal respiratory effort. CV.  Regular rate and rhythm, no JVD, rub or murmur. Abdomen.  Soft, nontender, nondistended, BS positive. CNS.  Somnolent, not following much commands. Extremities.  No edema, no cyanosis, pulses intact and symmetrical. Psychiatry.  Judgment and insight appears impaired.   DVT prophylaxis: Lovenox Code Status: Full Family Communication: Talked with legal guardian on phone. Disposition Plan:  Status is: Inpatient  Remains inpatient appropriate because:Inpatient level of care appropriate due to severity of illness  Dispo: The patient is from: Group home              Anticipated d/c is to: Group home              Patient currently is not medically stable to d/c.   Difficult to place patient No              Level of care: Med-Surg  All the records are reviewed and case discussed with Care Management/Social Worker. Management plans discussed with the patient, nursing and they are in agreement.  Consultants:  None  Procedures:  Antimicrobials:  Ceftriaxone  Data  Reviewed: I have personally reviewed following labs and imaging studies  CBC: Recent Labs  Lab 05/26/21 2040 05/27/21 0957 05/28/21 0529 05/29/21 0703  WBC 13.1* 20.4* 16.4* 7.6  NEUTROABS 8.7*  --   --   --   HGB 13.2 11.6* 12.5 12.2  HCT 38.3 33.2* 37.1 35.1*  MCV 110.1* 107.1* 106.0* 103.5*  PLT 210 161 177 416    Basic Metabolic Panel: Recent Labs  Lab 05/26/21 2040 05/27/21 0957 05/28/21 0529  NA 136 136 141  K 5.0 4.3 4.0  CL 99 103 109  CO2 '25 25 26  ' GLUCOSE 145* 94 86  BUN '20 16 13  ' CREATININE 1.06* 0.81 0.72  CALCIUM 9.2 8.6* 8.3*    GFR: Estimated Creatinine Clearance: 61.4 mL/min (by C-G formula based on SCr of 0.72 mg/dL). Liver Function Tests: Recent Labs  Lab 05/26/21 2040  AST 39  ALT 18  ALKPHOS 87  BILITOT 0.7  PROT 6.4*  ALBUMIN 2.6*    No results for input(s): LIPASE, AMYLASE in the last 168  hours. Recent Labs  Lab 05/26/21 2040  AMMONIA 18    Coagulation Profile: Recent Labs  Lab 05/27/21 0127  INR 1.1    Cardiac Enzymes: No results for input(s): CKTOTAL, CKMB, CKMBINDEX, TROPONINI in the last 168 hours. BNP (last 3 results) No results for input(s): PROBNP in the last 8760 hours. HbA1C: No results for input(s): HGBA1C in the last 72 hours. CBG: No results for input(s): GLUCAP in the last 168 hours. Lipid Profile: No results for input(s): CHOL, HDL, LDLCALC, TRIG, CHOLHDL, LDLDIRECT in the last 72 hours. Thyroid Function Tests: No results for input(s): TSH, T4TOTAL, FREET4, T3FREE, THYROIDAB in the last 72 hours.  Anemia Panel: No results for input(s): VITAMINB12, FOLATE, FERRITIN, TIBC, IRON, RETICCTPCT in the last 72 hours. Sepsis Labs: Recent Labs  Lab 05/26/21 2040 05/27/21 0005 05/27/21 0957 05/27/21 1320 05/28/21 0529 05/29/21 0703  PROCALCITON 0.38  --  0.27  --   --   --   LATICACIDVEN  --    < > 2.2* 2.5* 2.2* 1.4   < > = values in this interval not displayed.     Recent Results (from the past 240  hour(s))  Resp Panel by RT-PCR (Flu A&B, Covid)     Status: None   Collection Time: 05/26/21  8:40 PM   Specimen: Nasopharyngeal(NP) swabs in vial transport medium  Result Value Ref Range Status   SARS Coronavirus 2 by RT PCR NEGATIVE NEGATIVE Final    Comment: (NOTE) SARS-CoV-2 target nucleic acids are NOT DETECTED.  The SARS-CoV-2 RNA is generally detectable in upper respiratory specimens during the acute phase of infection. The lowest concentration of SARS-CoV-2 viral copies this assay can detect is 138 copies/mL. A negative result does not preclude SARS-Cov-2 infection and should not be used as the sole basis for treatment or other patient management decisions. A negative result may occur with  improper specimen collection/handling, submission of specimen other than nasopharyngeal swab, presence of viral mutation(s) within the areas targeted by this assay, and inadequate number of viral copies(<138 copies/mL). A negative result must be combined with clinical observations, patient history, and epidemiological information. The expected result is Negative.  Fact Sheet for Patients:  EntrepreneurPulse.com.au  Fact Sheet for Healthcare Providers:  IncredibleEmployment.be  This test is no t yet approved or cleared by the [place] FDA and  has been authorized for detection and/or diagnosis of SARS-CoV-2 by FDA under an Emergency Use Authorization (EUA). This EUA will remain  in effect (meaning this test can be used) for the duration of the COVID-19 declaration under Section 564(b)(1) of the Act, 21 U.S.C.section 360bbb-3(b)(1), unless the authorization is terminated  or revoked sooner.       Influenza A by PCR NEGATIVE NEGATIVE Final   Influenza B by PCR NEGATIVE NEGATIVE Final    Comment: (NOTE) The Xpert Xpress SARS-CoV-2/FLU/RSV plus assay is intended as an aid in the diagnosis of influenza from Nasopharyngeal swab specimens  and should not be used as a sole basis for treatment. Nasal washings and aspirates are unacceptable for Xpert Xpress SARS-CoV-2/FLU/RSV testing.  Fact Sheet for Patients: EntrepreneurPulse.com.au  Fact Sheet for Healthcare Providers: IncredibleEmployment.be  This test is not yet approved or cleared by the [place] FDA and has been authorized for detection and/or diagnosis of SARS-CoV-2 by FDA under an Emergency Use Authorization (EUA). This EUA will remain in effect (meaning this test can be used) for the duration of the COVID-19 declaration under Section 564(b)(1) of the Act, 21 U.S.C. section 360bbb-3(b)(1),  unless the authorization is terminated or revoked.  Performed at [facility], [address] [place], [place] [address]   Urine Culture     Status: Abnormal   Collection Time: 05/26/21  8:40 PM   Specimen: Urine, Clean Catch  Result Value Ref Range Status   Specimen Description   Final    URINE, CLEAN CATCH Performed at [facility], [address], [place], [place] [address]    Special Requests   Final    NONE Performed at [facility], [address] [place], [place] [address]    Culture >=100,000 COLONIES/mL ESCHERICHIA COLI (A)  Final   Report Status 05/29/2021 FINAL  Final   Organism ID, Bacteria ESCHERICHIA COLI (A)  Final      Susceptibility   Escherichia coli - MIC*    AMPICILLIN >=32 RESISTANT Resistant     CEFAZOLIN <=4 SENSITIVE Sensitive     CEFEPIME <=0.12 SENSITIVE Sensitive     CEFTRIAXONE <=0.25 SENSITIVE Sensitive     CIPROFLOXACIN <=0.25 SENSITIVE Sensitive     GENTAMICIN >=16 RESISTANT Resistant     IMIPENEM <=0.25 SENSITIVE Sensitive     NITROFURANTOIN <=16 SENSITIVE Sensitive     TRIMETH/SULFA >=320 RESISTANT Resistant     AMPICILLIN/SULBACTAM >=32 RESISTANT Resistant     PIP/TAZO <=4 SENSITIVE Sensitive     * >=100,000 COLONIES/mL ESCHERICHIA COLI  Blood culture  (routine single)     Status: Abnormal   Collection Time: 05/27/21  9:57 AM   Specimen: BLOOD  Result Value Ref Range Status   Specimen Description   Final    BLOOD BLOOD RIGHT ARM Performed at [facility], [address], [place], [place] [address]    Special Requests   Final    BOTTLES DRAWN AEROBIC AND ANAEROBIC Blood Culture adequate volume Performed at [facility], [address] [place], [place] [address]    Culture  Setup Time   Final    GRAM NEGATIVE RODS IN BOTH AEROBIC AND ANAEROBIC BOTTLES CRITICAL RESULT CALLED TO, READ BACK BY AND VERIFIED WITH: NATHAN BELUE @ 8592 05/27/21 LFD Performed at [facility], [place] [address] [place], [place] [address]    Culture ESCHERICHIA COLI (A)  Final   Report Status 05/30/2021 FINAL  Final   Organism ID, Bacteria ESCHERICHIA COLI  Final      Susceptibility   Escherichia coli - MIC*    AMPICILLIN >=32 RESISTANT Resistant     CEFAZOLIN <=4 SENSITIVE Sensitive     CEFEPIME <=0.12 SENSITIVE Sensitive     CEFTAZIDIME <=1 SENSITIVE Sensitive     CEFTRIAXONE <=0.25 SENSITIVE Sensitive     CIPROFLOXACIN <=0.25 SENSITIVE Sensitive     GENTAMICIN >=16 RESISTANT Resistant     IMIPENEM <=0.25 SENSITIVE Sensitive     TRIMETH/SULFA >=320 RESISTANT Resistant     AMPICILLIN/SULBACTAM >=32 RESISTANT Resistant     PIP/TAZO <=4 SENSITIVE Sensitive     * ESCHERICHIA COLI  Blood Culture ID Panel (Reflexed)     Status: Abnormal (Preliminary result)   Collection Time: 05/27/21  9:57 AM  Result Value Ref Range Status   Enterococcus faecalis NOT DETECTED NOT DETECTED Final   Enterococcus Faecium NOT DETECTED NOT DETECTED Final   Listeria monocytogenes NOT DETECTED NOT DETECTED Final   Staphylococcus species NOT DETECTED NOT DETECTED Final   Staphylococcus aureus (BCID) PENDING NOT DETECTED Incomplete   Staphylococcus epidermidis PENDING NOT DETECTED Incomplete   Staphylococcus lugdunensis PENDING NOT DETECTED Incomplete    Streptococcus  species NOT DETECTED NOT DETECTED Final   Streptococcus agalactiae NOT DETECTED NOT DETECTED Final   Streptococcus pneumoniae NOT DETECTED NOT DETECTED Final   Streptococcus pyogenes NOT DETECTED NOT DETECTED Final   A.calcoaceticus-baumannii NOT DETECTED NOT DETECTED Final   Bacteroides fragilis NOT DETECTED NOT DETECTED Final   Enterobacterales DETECTED (A) NOT DETECTED Final    Comment: Enterobacterales represent a large order of gram negative bacteria, not a single organism. CRITICAL RESULT CALLED TO, READ BACK BY AND VERIFIED WITH: NATHAN BELUE @ [phone] 05/27/21 LFD    Enterobacter cloacae complex NOT DETECTED NOT DETECTED Final   Escherichia coli DETECTED (A) NOT DETECTED Final    Comment: CRITICAL RESULT CALLED TO, READ BACK BY AND VERIFIED WITH: NATHAN BELUE @ 2320 05/27/21 LFD     Klebsiella aerogenes NOT DETECTED NOT DETECTED Final   Klebsiella oxytoca NOT DETECTED NOT DETECTED Final   Klebsiella pneumoniae NOT DETECTED NOT DETECTED Final   Proteus species NOT DETECTED NOT DETECTED Final   Salmonella species NOT DETECTED NOT DETECTED Final   Serratia marcescens NOT DETECTED NOT DETECTED Final   Haemophilus influenzae NOT DETECTED NOT DETECTED Final   Neisseria meningitidis NOT DETECTED NOT DETECTED Final   Pseudomonas aeruginosa NOT DETECTED NOT DETECTED Final   Stenotrophomonas maltophilia NOT DETECTED NOT DETECTED Final   Candida albicans NOT DETECTED NOT DETECTED Final   Candida auris NOT DETECTED NOT DETECTED Final   Candida glabrata NOT DETECTED NOT DETECTED Final   Candida krusei NOT DETECTED NOT DETECTED Final   Candida parapsilosis NOT DETECTED NOT DETECTED Final   Candida tropicalis NOT DETECTED NOT DETECTED Final   Cryptococcus neoformans/gattii NOT DETECTED NOT DETECTED Final   CTX-M ESBL NOT DETECTED NOT DETECTED Final   Carbapenem resistance IMP NOT DETECTED NOT DETECTED Final   Carbapenem resistance KPC NOT DETECTED NOT DETECTED Final    Carbapenem resistance NDM NOT DETECTED NOT DETECTED Final   Carbapenem resist OXA 48 LIKE NOT DETECTED NOT DETECTED Final   Carbapenem resistance VIM NOT DETECTED NOT DETECTED Final    Comment: Performed at [facility], [address], [place], [place] [address]      Radiology Studies: No results found.  Scheduled Meds:  alum & mag hydroxide-simeth  30 mL Oral TID AC & HS   calcium citrate-vitamin D   Oral BID   cholecalciferol  2,000 Units Oral Daily   DULoxetine  90 mg Oral Daily   enoxaparin (LOVENOX) injection  40 mg Subcutaneous Q24H   loratadine  10 mg Oral Daily   polymixin-bacitracin   Topical BID   senna  2 tablet Oral BID   Continuous Infusions:  sodium chloride 75 mL/hr at 05/30/21 1352   [START ON 05/31/2021]  ceFAZolin (ANCEF) IV     valproate sodium 250 mg (05/30/21 0547)     LOS: 3 days   Time spent: 38 minutes. More than 50% of the time was spent in counseling/coordination of care  Lorella Nimrod, MD [facility]  If 7PM-7AM, please contact night-coverage [email or web address]  05/30/2021, 4:03 PM   This record has been created using Systems analyst. Errors have been sought and corrected,but may not always be located. Such creation errors do not reflect on the standard of care.

## 2021-05-30 NOTE — Consult Note (Signed)
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 Coli urosepsis being treated with ceftriaxone. She also has a seizure disorder and is on depakote 250mg  q 6 hrs. Neurology is consulted 2/2 persistent encephalopathy.  Patient unable to provide history 2/2 being nonverbal. Per H&P dated 05/27/21: "Sabrina Johnston is a 65 y.o. female with medical history significant for cognitive and neurobehavioral dysfunction, seizure disorder and chronic constipation, who presented to the emergency room with acute onset of generalized weakness and altered mental status.  The patient has not been ambulating over the last 24 hours.  She has not been eating or drinking over the last couple of days.  She is unable to provide any history due to her baseline mental status however according to the legal guardian who lives in [place] she is nonverbal at baseline but able to walk somewhat.  Also history of recent multiple falls.  She was mostly dependent for her ADLs.  Sometimes able to feed herself but need a lot of supervision.   Found to have mild leukocytosis, mildly elevated TSH, mild lactic acidosis in twos, UA with some bacteria and mild leukocytosis, urine and blood cultures pending Abdominal and chest x-ray without any acute abnormalities.  Pelvic x-rays with no fractures.  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   Blood culture came back positive for E. coli and she was started on ceftriaxone.  Lactic  acid remained around 2.2.   Repeat CT head was without any new abnormality.   According to her legal guardian at her facility she was sleeping most of the time since she came to that facility.  Normally to get up to eat but they have noticed that she was becoming more and more lethargic and does not want to eat for the past couple of weeks."  MRI brain wo contrast  1. Severely limited exam due to extensive motion artifact. 2. No definite acute intracranial infarct or other abnormality identified. 3. 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 4. 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  CNS imaging personally reviewed; I agree with above interpretation.   ROS   UTA; patient is nonverbal  Past History   Past Medical History:  Diagnosis Date   Menopause    Mental retardation    PMB (postmenopausal bleeding)    Past Surgical History:  Procedure Laterality Date   HYSTEROSCOPY WITH D & C  2013   b9 endometrium wiht polyps   Family History  Family history unknown: Yes   Social History   Socioeconomic History   Marital status: Single    Spouse name: Not on file   Number of children: Not on file   Years of education: Not on file   Highest education level: Not on file  Occupational History   Not on file  Tobacco Use   Smoking status: Never  Smokeless tobacco: Never  Vaping Use   Vaping Use: Never used  Substance and Sexual Activity   Alcohol use: No   Drug use: No   Sexual activity: Never  Other Topics Concern   Not on file  Social History Narrative   Not on file   Social Determinants of Health   Financial Resource Strain: Not on file  Food Insecurity: Not on file  Transportation Needs: Not on file  Physical Activity: Not on file  Stress: Not on file  Social Connections: Not on file   No Known Allergies  Medications   Medications Prior to  Admission  Medication Sig Dispense Refill Last Dose   acetaminophen (TYLENOL) 325 MG tablet Take 650 mg by mouth every 6 (six) hours as needed.   prn at prn   aluminum-magnesium hydroxide-simethicone (MAALOX) 200-200-20 MG/5ML SUSP Take 30 mLs by mouth 4 (four) times daily -  before meals and at bedtime.   unknown at unknown   bacitracin-polymyxin b (POLYSPORIN) ointment Apply topically 2 (two) times daily.   unknown at unknown   Calcium Citrate-Vitamin D 200-250 MG-UNIT TABS Take 1 tablet by mouth 2 (two) times daily.   unknown at unknown   carbamide peroxide (DEBROX) 6.5 % otic solution 5 drops 2 (two) times daily.   unknown   cetirizine (ZYRTEC) 10 MG tablet Take 10 mg by mouth daily.   unknown   Cholecalciferol 50 MCG (2000 UT) CAPS Take 1 capsule by mouth daily.   unknown at unknown   divalproex (DEPAKOTE) 500 MG DR tablet Take 500 mg by mouth 2 (two) times daily.   unknown   DULoxetine (CYMBALTA) 30 MG capsule Take 30 mg by mouth daily.   unknown   DULoxetine (CYMBALTA) 60 MG capsule Take 60 mg by mouth daily.   unknown   furosemide (LASIX) 20 MG tablet Take 20 mg by mouth daily.   unknown at unknown   hydrOXYzine (ATARAX/VISTARIL) 25 MG tablet Take 25 mg by mouth 3 (three) times daily as needed.   prn at prn   lanolin-mineral oil (KERI) LOTN Apply topically as needed.   prn at prn   loperamide (IMODIUM) 2 MG capsule Take by mouth as needed for diarrhea or loose stools.   prn at prn   magnesium hydroxide (MILK OF MAGNESIA) 800 MG/5ML suspension Take by mouth daily as needed for constipation.   prn at prn   Multiple Vitamins-Minerals (CERTAGEN SILVER PO) Take 1 tablet by mouth daily.   unknown   senna (SENOKOT) 8.6 MG tablet TAKE 2 TABLETS BY MOUTH TWO TIMES A DAY.   unknown   tolnaftate (TINACTIN) 1 % powder Apply 1 application topically 2 (two) times daily.   unknown   bisacodyl (DULCOLAX) 10 MG suppository Place 10 mg rectally as needed for moderate constipation.   prn at prn    guaifenesin (ROBITUSSIN) 100 MG/5ML syrup Take 200 mg by mouth 3 (three) times daily as needed for cough.   prn at prn     Vitals   Vitals:   05/29/21 2047 05/30/21 0051 05/30/21 0542 05/30/21 0821  BP: (!) 127/57 (!) 107/59 (!) 110/57 109/63  Pulse: 63 85 70 67  Resp: 17 17 18 17   Temp: 97.8 F (36.6 C) 97.8 F (36.6 C) 98.4 F (36.9 C) (!) 97.4 F (36.3 C)  TempSrc:      SpO2: 99% 90% 96% 97%  Weight:      Height:         Body mass index  is 28.72 kg/m.  Physical Exam   Physical Exam Gen: asleep, will arouse to noxious stimuli but will not open eyes. Actively resists examination including eye opening and repositioning. Resp: CTAB, no w/r/r CV: RRR, no m/g/r; nml S1 and S2. 2+ symmetric peripheral pulses.  Neuro: *MS: asleep, will arouse to noxious stimuli but will not open eyes. Actively resists examination including eye opening and repositioning. *Speech: nonverbal baseline *CN: PERRL, (+) oculocephalics and corneals, spontaneously coughs and gags *Motor and sensory: localizes equally in all extremities to noxious stimuli. Occasionally after significant stimuli will move all extremities and attempt to sit up *Coordination:  UTA *Reflexes:  2+ brisk and symmetric throughout without clonus; toes down-going bilat *Gait: UTA   Premorbid mRS = 5   Labs   CBC:  Recent Labs  Lab 05/26/21 2040 05/27/21 0957 05/28/21 0529 05/29/21 0703  WBC 13.1*   < > 16.4* 7.6  NEUTROABS 8.7*  --   --   --   HGB 13.2   < > 12.5 12.2  HCT 38.3   < > 37.1 35.1*  MCV 110.1*   < > 106.0* 103.5*  PLT 210   < > 177 189   < > = values in this interval not displayed.    Basic Metabolic Panel:  Lab Results  Component Value Date   NA 141 05/28/2021   K 4.0 05/28/2021   CO2 26 05/28/2021   GLUCOSE 86 05/28/2021   BUN 13 05/28/2021   CREATININE 0.72 05/28/2021   CALCIUM 8.3 (L) 05/28/2021   GFRNONAA >60 05/28/2021   GFRAA >60 09/15/2012   Lipid Panel:  Lab Results   Component Value Date   LDLCALC 120 (H) 09/15/2012   HgbA1c: No results found for: HGBA1C Urine Drug Screen: No results found for: LABOPIA, COCAINSCRNUR, LABBENZ, AMPHETMU, THCU, LABBARB  Alcohol Level No results found for: [facility]   Impression   Neurology consulted for acute/subacute encephalopathy in this patient who presented with E. Coli urosepsis with bacteremia. On my examination she was somnolent and mildly resistant to exam, would not follow commands, although her exam improved by this evening and she was following some simple commands per RN report. She has baseline cognitive and neurobehavioral dysfunction and is nonverbal requiring assistance with most ADLs at baseline. Suspect encephalopathy 2/2 infection which is now being treated with ceftriaxone. Improvement after starting abx is reassuring although she is not yet back to baseline. Will re-examine tomorrow. Given her hx seizure disorder and current infection which would decrease seizure threshold I did order EEG which was unable to be performed 2/2 agitation. Will re-attempt EEG tomorrow. In the meantime, continue home depakote and valium parenteral formulation. Depakote level is pending. No additional workup indicated at this time.   Recommendations   - Continue VPA IV 250mg  q 6 hrs - VPA level - re-attempt EEG tmrw ______________________________________________________________________   Thank you for the opportunity to take part in the care of this patient. If you have any further questions, please contact the neurology consultation attending.  Signed,  , MD [facility] [phone]  If 7pm- 7am, please page neurology on call as listed in AMION.

## 2021-05-30 NOTE — Progress Notes (Signed)
Eeg done 

## 2021-05-30 NOTE — Progress Notes (Signed)
Neurology Progress Note  Subjective: - Patient remains somnolent - Became combative during repeat EEG today, we were able to complete it after giving 3mg  IV ativan - EEG interpreted by me at bedside; no seizure activity during the recording. Report to follow.  Exam: Vitals:   05/30/21 0821 05/30/21 1557  BP: 109/63 125/65  Pulse: 67 63  Resp: 17 18  Temp: (!) 97.4 F (36.3 C) (!) 97.5 F (36.4 C)  SpO2: 97% 99%   Physical Exam Gen: asleep, will arouse to noxious stimuli but will not open eyes. Actively resists examination including eye opening and repositioning. Resp: CTAB, no w/r/r CV: RRR, no m/g/r; nml S1 and S2. 2+ symmetric peripheral pulses.   Neuro: *MS: asleep, will arouse to noxious stimuli but will not open eyes. Actively resists examination including eye opening and repositioning. *Speech: nonverbal baseline *CN: PERRL, (+) oculocephalics and corneals, spontaneously coughs and gags *Motor and sensory: localizes equally in all extremities to noxious stimuli. Occasionally after significant stimuli will move all extremities and attempt to sit up *Coordination:  UTA *Reflexes:  2+ brisk and symmetric throughout without clonus; toes down-going bilat *Gait: UTA   Premorbid mRS = 5   Impression: Neurology consulted for acute/subacute encephalopathy in this patient who presented with E. Coli urosepsis with bacteremia. On my examination she is somnolent and mildly resistant to exam, would not follow commands, unchanged from yesterday. She has baseline cognitive and neurobehavioral dysfunction and is nonverbal requiring assistance with most ADLs at baseline. Suspect encephalopathy 2/2 infection which is now being treated with ceftriaxone; she has extensive encephalomalacia and little cognitive reserve and is expected to take longer to recover from a mental status standpoint as a result. Her EEG today did not show any seizure activity, although she required 3mg  IV ativan for  agitation beforehand in order to enable 06/01/21 to hook up the leads. Her depakote level was 48 just below therapeutic level but she did miss a few days and was restarted early yesterday. Will continue current dose and recheck level tomorrow.  Recommendations: - F/u VPA level - Will continue to follow  , MD [facility] [phone]  If 7pm- 7am, please page neurology on call as listed in AMION.

## 2021-05-30 NOTE — Progress Notes (Signed)
PT Cancellation Note  Patient Details Name: Sabrina Johnston MRN: [id number] DOB: 1956-03-04   Cancelled Treatment:    Reason Eval/Treat Not Completed: Patient at procedure or test/unavailable. Order received, pt chart reviewed. Pt awake, eating breakfast with SLP. Will return at a later time this date to perform evaluation.    Basilia Jumbo PT, DPT 05/30/21 9:13 AM [phone]

## 2021-05-30 NOTE — Care Management Important Message (Signed)
Important Message  Patient Details  Name: Sabrina Johnston MRN: [id number] Date of Birth: [birth year]   Medicare Important Message Given:  N/A - LOS <3 / Initial given by admissions     [name] 05/30/2021, 9:15 AM

## 2021-05-30 NOTE — Progress Notes (Signed)
PT Cancellation Note  Patient Details Name: Sabrina Johnston MRN: [id number] DOB: 1955-11-24   Cancelled Treatment:    Reason Eval/Treat Not Completed: Patient at procedure or test/unavailable. Second attempt this date for PT evaluation. Pt out of room at this time. Will continue to follow and re-attempt as time allows.    Basilia Jumbo PT, DPT 05/30/21 11:57 AM [phone]

## 2021-05-30 NOTE — Procedures (Signed)
Routine EEG Report  Sabrina Johnston is a 65 y.o. female with a history of seizures who is undergoing an EEG to evaluate for seizures.  Report: This EEG was acquired with electrodes placed according to the International 10-20 electrode system (including Fp1, Fp2, F3, F4, C3, C4, P3, P4, O1, O2, T3, T4, T5, T6, A1, A2, Fz, Cz, Pz). The following electrodes were missing or displaced: none.  There was no occipital dominant rhythm. The best background was low voltage fast with occasional delta slowing over the right frontal region. This activity is reactive to stimulation. Drowsiness was manifested by background fragmentation; deeper stages of sleep were not identified. There was no focal slowing. There were no interictal epileptiform discharges. There were no electrographic seizures identified. There was no abnormal response to photic stimulation or hyperventilation.   Impression and clinical correlation: This EEG was obtained while awake and drowsy and is abnormal due to intermittent slowing over the right frontal region over the area of remote infarct seen on MRI. There was no seizure activity observed during this recording.  Bing Neighbors, MD [facility] [phone]  If 7pm- 7am, please page neurology on call as listed in AMION.

## 2021-05-31 LAB — CBC
HCT: 32.9 % — ABNORMAL LOW (ref 36.0–46.0)
Hemoglobin: 11.8 g/dL — ABNORMAL LOW (ref 12.0–15.0)
MCH: 37.5 pg — ABNORMAL HIGH (ref 26.0–34.0)
MCHC: 35.9 g/dL (ref 30.0–36.0)
MCV: 104.4 fL — ABNORMAL HIGH (ref 80.0–100.0)
Platelets: 178 10*3/uL (ref 150–400)
RBC: 3.15 MIL/uL — ABNORMAL LOW (ref 3.87–5.11)
RDW: 14.4 % (ref 11.5–15.5)
WBC: 7.2 10*3/uL (ref 4.0–10.5)
nRBC: 1.2 % — ABNORMAL HIGH (ref 0.0–0.2)

## 2021-05-31 LAB — VALPROIC ACID LEVEL: Valproic Acid Lvl: 68 ug/mL (ref 50.0–100.0)

## 2021-05-31 LAB — VITAMIN D 25 HYDROXY (VIT D DEFICIENCY, FRACTURES): Vit D, 25-Hydroxy: 78.56 ng/mL (ref 30–100)

## 2021-05-31 LAB — BASIC METABOLIC PANEL
Anion gap: 11 (ref 5–15)
BUN: 10 mg/dL (ref 8–23)
CO2: 24 mmol/L (ref 22–32)
Calcium: 7.7 mg/dL — ABNORMAL LOW (ref 8.9–10.3)
Chloride: 109 mmol/L (ref 98–111)
Creatinine, Ser: 0.58 mg/dL (ref 0.44–1.00)
GFR, Estimated: >60 mL/min (ref >60)
Glucose, Bld: 80 mg/dL (ref 70–99)
Potassium: 2.8 mmol/L — ABNORMAL LOW (ref 3.5–5.1)
Sodium: 144 mmol/L (ref 135–145)

## 2021-05-31 LAB — MAGNESIUM: Magnesium: 1.9 mg/dL (ref 1.7–2.4)

## 2021-05-31 LAB — VITAMIN B12: Vitamin B-12: 1846 pg/mL — ABNORMAL HIGH (ref 180–914)

## 2021-05-31 LAB — PHOSPHORUS: Phosphorus: 3.5 mg/dL (ref 2.5–4.6)

## 2021-05-31 LAB — T4, FREE: Free T4: 1.21 ng/dL — ABNORMAL HIGH (ref 0.61–1.12)

## 2021-05-31 LAB — TSH: TSH: 10.274 u[IU]/mL — ABNORMAL HIGH (ref 0.350–4.500)

## 2021-05-31 LAB — FOLATE: Folate: 29 ng/mL (ref >5.9)

## 2021-05-31 MED ORDER — POTASSIUM CHLORIDE 10 MEQ/100ML IV SOLN
10.0000 meq | INTRAVENOUS | Status: AC
Start: 2021-05-31 — End: 2021-06-01
  Administered 2021-05-31 – 2021-06-01 (×5): 10 meq via INTRAVENOUS
  Filled 2021-05-31: qty 100

## 2021-05-31 MED ORDER — POTASSIUM CHLORIDE CRYS ER 20 MEQ PO TBCR
40.0000 meq | EXTENDED_RELEASE_TABLET | Freq: Once | ORAL | Status: AC
  Administered 2021-05-31: 22:00:00 40 meq via ORAL
  Filled 2021-05-31: qty 2

## 2021-05-31 MED ORDER — POTASSIUM CHLORIDE 10 MEQ/100ML IV SOLN: 10.0000 meq | INTRAVENOUS | Status: DC

## 2021-05-31 NOTE — Progress Notes (Signed)
PT Cancellation Note  Patient Details Name: Sabrina Johnston MRN: [id number] DOB: [birth year]   Cancelled Treatment:    Reason Eval/Treat Not Completed: Fatigue/lethargy limiting ability to participate (Evaluation re-attempted (x4 days).  Patient remains grossly lethargic; unable to rouse despite max efforts.  Remains in L sidelying fetal position; generally resistive to movement attempts. Unable to elicit alertness necessary to participate with skilled evaluation.  Will complete orders at this time given multiple attempts without success.  Please re-consult as medical status, alertness improves and patient able to grossly participate with evaluation/mobility efforts.)   Casee Knepp H. Manson Passey, PT, DPT, NCS 05/31/21, 11:33 AM [phone]

## 2021-05-31 NOTE — Progress Notes (Signed)
[facility] Progress Note  Patient: Sabrina Johnston    KTG:[id number]  DOA: 05/26/2021     Date of Service: the patient was seen and examined on 05/31/2021  Chief Complaint  Patient presents with   Weakness    C/o decreased fluid and food intake with weakness toward right side, and dark malodorous urine. Denies fevers.   Brief hospital course: Taken from H&P. Sabrina LACEK is a 65 y.o. female with medical history significant for cognitive and neurobehavioral dysfunction, seizure disorder and chronic constipation, who presented to the emergency room with acute onset of generalized weakness and altered mental status.  The patient has not been ambulating over the last 24 hours.  She has not been eating or drinking over the last couple of days.  She is unable to provide any history due to her baseline mental status however according to the legal guardian who lives in [place] she is nonverbal at baseline but able to walk somewhat.  Also history of recent multiple falls.  She was mostly dependent for her ADLs.  Sometimes able to feed herself but need a lot of supervision.   Found to have mild leukocytosis, mildly elevated TSH, mild lactic acidosis in twos, UA with some bacteria and mild leukocytosis, urine and blood cultures pending Abdominal and chest x-ray without any acute abnormalities.  Pelvic x-rays with no fractures.  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   Blood culture came back positive for E. coli and she was started on ceftriaxone.  Lactic acid remained around 2.2.   Repeat CT head was without any new abnormality.   According to her legal guardian at her facility she was sleeping most of the time since she came to that facility.  Normally to get up to eat but they have noticed that she was becoming more and  more lethargic and does not want to eat for the past couple of weeks.   Neurology was also consulted-EEG done today with Ativan as she became combative yesterday during EEG.  Pending results.     Assessment and Plan: Active Problems:   Dehydration   Altered mental status   Hypotension   Sepsis due to Escherichia coli with encephalopathy (HCC)   Seizure disorder (HCC)   Altered mental status, most likely secondary to UTI, sepsis, E. coli bacteremia  Patient was sleeping most of the time for the past 2 days, not interested in eating or drinking although some response to painful stimuli.  Similar behavior at her group home per legal guardian. CT head without any acute abnormality, similar changes were observed on prior imaging.  ABG with very mild hypoxia and no hypercarbia. There was some concern of decreased p.o. intake and dehydration.  Per legal guardian she was told that patient was not eating well and having multiple falls recently. Unable to explain any symptoms due to underlying severe cognitive impairment.  At baseline she is nonverbal and mostly dependent on others for ADLs. -Neurology consult to rule out any other underlying issue-they advised EEG -Continue to monitor-sometimes patients with MS behave with increased somnolence. -Continue with maintenance fluid until she was able to take p.o.   Sepsis secondary to E. coli bacteremia.  Patient initially met SIRS criteria with leukocytosis, tachycardia and tachypnea and was not declared as septic as there was no obvious source of infection. Later urine  and blood cultures with E. coli so she met sepsis criteria.  She was started on ceftriaxone.  E. coli sensitive to ceftriaxone and Cipro -Monitor CBC -8/17 follow repeat blood cultures     Hypokalemia, potassium repleted.  Seizure disorder. -Continue home dose of Depakote and Valium-switch to IV as unable to take p.o. Neurology following  Multiple sclerosis.  No obvious  flare. -Continue to monitor  Physical deconditioning/multiple falls. -PT/OT evaluation   Chronic constipation. -Continue with bowel regimen  Abnormal thyroid function test, elevated TSH level 10.2 and slightly elevated free T4 level 1.21 Recommend to follow with endocrine as an outpatient   Body mass index is 28.72 kg/m.  Interventions:       Diet: Dysphagia 3 DVT Prophylaxis: Subcutaneous Lovenox   Advance goals of care discussion: Full code  Family Communication: family was  NOT present at bedside, at the time of interview.    Disposition:  Pt is from group home, admitted with AMS, decreased p.o. intake, sepsis, UTI, still has decreased p.o. intake and found to have hypokalemia, which precludes a safe discharge. Discharge to group home, when cleared by neurology, possibly in 1 to 2 days.  Subjective: No overnight events, patient was taking coughing sounds spontaneously, unable to follow commands and unable to offer any complaints  Physical Exam: General:  NAD .  Appear in no distress,  Eyes: PERRLA ENT: Oral Mucosa Clear, moist  Neck: no JVD,  Cardiovascular: S1 and S2 Present, no Murmur,  Respiratory: good respiratory effort, Bilateral Air entry equal and Decreased, no Crackles, no wheezes Abdomen: Bowel Sound present, Soft and no tenderness,  Skin: no rashes Extremities: no Pedal edema, no calf tenderness Neurologic: without any new focal findings Gait not checked due to patient safety concerns  Vitals:   05/30/21 2018 05/31/21 0007 05/31/21 0547 05/31/21 0834  BP: 121/70 (!) 123/54 128/69 (!) 159/119  Pulse: (!) 110 67 69 79  Resp: _0 Temp: (!) 97.4 F (36.3 C) (!) 97.5 F (36.4 C)  (!) 97.5 F (36.4 C)  TempSrc:      SpO2: 100% 100% 100% (!) 89%  Weight:      Height:        Intake/Output Summary (Last 24 hours) at 05/31/2021 1451 Last data filed at 05/31/2021 0300 Gross per 24 hour  Intake 2464.27 ml  Output --  Net 2464.27 ml   Filed  Weights   05/26/21 2026 05/27/21 0626  Weight: 67.1 kg 66.7 kg    Data Reviewed: I have personally reviewed and interpreted daily labs, tele strips, imagings as discussed above. I reviewed all nursing notes, pharmacy notes, vitals, pertinent old records I have discussed plan of care as described above with RN and patient/family.  CBC: Recent Labs  Lab 05/26/21 2040 05/27/21 0957 05/28/21 0529 05/29/21 0703 05/31/21 1057  WBC 13.1* 20.4* 16.4* 7.6 7.2  NEUTROABS 8.7*  --   --   --   --   HGB 13.2 11.6* 12.5 12.2 11.8*  HCT 38.3 33.2* 37.1 35.1* 32.9*  MCV 110.1* 107.1* 106.0* 103.5* 104.4*  PLT 210 161 177 189 814   Basic Metabolic Panel: Recent Labs  Lab 05/26/21 2040 05/27/21 0957 05/28/21 0529 05/31/21 1057  NA 136 136 141 144  K 5.0 4.3 4.0 2.8*  CL 99 103 109 109  CO2 _1 GLUCOSE 145* 94 86 80  BUN _2 CREATININE 1.06* 0.81 0.72 0.58  CALCIUM 9.2 8.6* 8.3*  7.7*  MG  --   --   --  1.9  PHOS  --   --   --  3.5    Studies: EEG adult  Result Date: 06/03/2021 Derek Jack, MD     06-03-2021  6:50 PM Routine EEG Report DARLING CIESLEWICZ is a 65 y.o. female with a history of seizures who is undergoing an EEG to evaluate for seizures. Report: This EEG was acquired with electrodes placed according to the International 10-20 electrode system (including Fp1, Fp2, F3, F4, C3, C4, P3, P4, O1, O2, T3, T4, T5, T6, A1, A2, Fz, Cz, Pz). The following electrodes were missing or displaced: none. There was no occipital dominant rhythm. The best background was low voltage fast with occasional delta slowing over the right frontal region. This activity is reactive to stimulation. Drowsiness was manifested by background fragmentation; deeper stages of sleep were not identified. There was no focal slowing. There were no interictal epileptiform discharges. There were no electrographic seizures identified. There was no abnormal response to photic stimulation or  hyperventilation. Impression and clinical correlation: This EEG was obtained while awake and drowsy and is abnormal due to intermittent slowing over the right frontal region over the area of remote infarct seen on MRI. There was no seizure activity observed during this recording. Su Monks, MD [facility] [phone] If 7pm- 7am, please page neurology on call as listed in [place].    Scheduled Meds:  alum & mag hydroxide-simeth  30 mL Oral TID AC & HS   calcium citrate-vitamin D   Oral BID   cholecalciferol  2,000 Units Oral Daily   DULoxetine  90 mg Oral Daily   enoxaparin (LOVENOX) injection  40 mg Subcutaneous Q24H   loratadine  10 mg Oral Daily   polymixin-bacitracin   Topical BID   senna  2 tablet Oral BID   Continuous Infusions:  sodium chloride 100 mL/hr at 05/31/21 1138    ceFAZolin (ANCEF) IV 2 g (Jun 03, 2021 2306)   valproate sodium 250 mg (05/31/21 1219)   PRN Meds: acetaminophen **OR** acetaminophen, bisacodyl, guaifenesin, hydrALAZINE, hydrOXYzine, loperamide, magnesium hydroxide, ondansetron **OR** ondansetron (ZOFRAN) IV, traZODone  Time spent: 35 minutes  Author: Val Riles. MD [facility] 05/31/2021 2:51 PM  To reach On-call, see care teams to locate the attending and reach out to them via [email or web address]. If 7PM-7AM, please contact night-coverage If you still have difficulty reaching the attending provider, please page the [facility] (Director on Call) for [facility] on amion for assistance.

## 2021-05-31 NOTE — Progress Notes (Addendum)
Neurology Progress Note  Subjective: - Patient intermittently lethargic and resistant to exam. Per RN she was able to sit up and eat last night - VPA level therapeutic at 68  Exam: Vitals:   05/31/21 0547 05/31/21 0834  BP: 128/69 (!) 159/119  Pulse: 69 79  Resp:  17  Temp:  (!) 97.5 F (36.4 C)  SpO2: 100% (!) 89%   Physical Exam Gen: asleep, will arouse to noxious stimuli but will not open eyes. Actively resists examination including eye opening and repositioning. Resp: CTAB, no w/r/r CV: RRR, no m/g/r; nml S1 and S2. 2+ symmetric peripheral pulses.   Neuro: *MS: asleep, will arouse to noxious stimuli but will not open eyes. Actively resists examination including eye opening and repositioning. *Speech: nonverbal baseline *CN: PERRL, (+) oculocephalics and corneals, spontaneously coughs and gags *Motor and sensory: localizes equally in all extremities to noxious stimuli. Occasionally after significant stimuli will move all extremities and attempt to sit up *Coordination:  UTA *Reflexes:  2+ brisk and symmetric throughout without clonus; toes down-going bilat *Gait: UTA   Premorbid mRS = 5   Impression: Neurology consulted for acute/subacute encephalopathy in this patient who presented with E. Coli urosepsis with bacteremia. On my examination she is somnolent and mildly resistant to exam, although she was able to sit up and eat last night. She has baseline cognitive and neurobehavioral dysfunction and is nonverbal requiring assistance with most ADLs at baseline. Suspect encephalopathy 2/2 infection which is now being treated with ceftriaxone; she has extensive encephalomalacia and little cognitive reserve and is expected to take longer to recover from a mental status standpoint as a result. Her EEG did not show any seizure activity, although she required 3mg  IV ativan for agitation beforehand in order to enable to hook up the leads. Her depakote level is now therapeutic at  68.  Recommendations: - Continue VPA IV 250mg  q 6 hrs - Continue to monitor  [place], MD [facility] [phone]  If 7pm- 7am, please page neurology on call as listed in AMION.

## 2021-05-31 NOTE — Progress Notes (Signed)
Occupational Therapy Treatment Patient Details Name: Sabrina Johnston MRN: [id number] DOB: May 22, 1956 Today's Date: 05/31/2021    History of present illness 65 y.o. female with medical history significant for cognitive and neurobehavioral dysfunction, seizure disorder and chronic constipation, who presented to the emergency room with acute onset of generalized weakness and altered mental status.  The patient has not been ambulating over the last 24 hours.  She has not been eating or drinking over the last couple of days.  She is unable to provide any history due to her baseline mental status however according to the legal guardian who lives in [place] she is nonverbal at baseline but able to walk somewhat.  Also history of recent multiple falls.  She was mostly dependent for her ADLs.  Sometimes able to feed herself but need a lot of supervision.     Found to have mild leukocytosis, mildly elevated TSH, mild lactic acidosis in twos, UA with some bacteria and mild leukocytosis, urine and blood cultures pending  Abdominal and chest x-ray without any acute abnormalities.  Pelvic x-rays with no fractures.  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   OT comments  Upon entering the room, pt alert, eyes open, and attempting to get to EOB. OT entered the room to assist pt and she scoots to EOB with min A. However, once at EOB pt begins attempting to hit herself in the face. Therapist blocking pt's hands but pt still attempting to scoot forward and no follows commands for safety. Therefore, pt required total A to return to bed for safety. RN arriving to room with medication. Pt returning to R side lying fetal position. Noise makers in bed with pt and when they make noise she smiles but will not hold and shake it herself. Pt's brief appears to be  saturated and pt required total A for hygiene and to don clean brief. Pt begins to calm once brief is changed. RN remains to give medications. Seizure pads added to bed and padded mat placed on floor for safety.  Follow Up Recommendations  SNF;Supervision/Assistance - 24 hour    Equipment Recommendations  Other (comment) (defer to next venue of care)       Precautions / Restrictions Precautions Precautions: Fall       Mobility Bed Mobility Overal bed mobility: Needs Assistance Bed Mobility: Supine to Sit;Sit to Supine     Supine to sit: Min guard Sit to supine: Total assist   General bed mobility comments: Pt scoots self to EOB but does not follow commands for safety. Once EOB, pt becomes self injurious and therapist providing total A to return to bed for safety.                Balance Overall balance assessment: Needs assistance Sitting-balance support: Feet supported Sitting balance-Leahy Scale: Poor                                     ADL either performed or assessed with clinical judgement     Vision Patient Visual Report: No change from baseline            Cognition Arousal/Alertness: Awake/alert Behavior During Therapy: Flat affect;Impulsive Overall Cognitive Status: History of cognitive impairments - at baseline  General Comments: does not follow commands this session and is impulsive with bed mobility                   Pertinent Vitals/ Pain       Pain Assessment: Faces Faces Pain Scale: No hurt         Frequency  Min 1X/week        Progress Toward Goals  OT Goals(current goals can now be found in the care plan section)  Progress towards OT goals: Progressing toward goals  Acute Rehab OT Goals Patient Stated Goal: none stated OT Goal Formulation: Patient unable to participate in goal setting Time For Goal Achievement: 06/11/21 Potential to Achieve Goals: Fair  Plan  Discharge plan remains appropriate       AM-PAC OT "6 Clicks" Daily Activity     Outcome Measure   Help from another person eating meals?: Total Help from another person taking care of personal grooming?: Total Help from another person toileting, which includes using toliet, bedpan, or urinal?: Total Help from another person bathing (including washing, rinsing, drying)?: Total Help from another person to put on and taking off regular upper body clothing?: Total Help from another person to put on and taking off regular lower body clothing?: Total 6 Click Score: 6    End of Session    OT Visit Diagnosis: Muscle weakness (generalized) (M62.81);Unsteadiness on feet (R26.81)   Activity Tolerance Other (comment) (limited by cognition and command following)   Patient Left in bed;with call bell/phone within reach;with bed alarm set   Nurse Communication Mobility status        Time: 5643-3295 OT Time Calculation (min): 24 min  Charges: OT General Charges $OT Visit: 1 Visit OT Treatments $Self Care/Home Management : 8-22 mins $Therapeutic Activity: 8-22 mins  Jackquline Denmark, MS, OTR/L , CBIS ascom [phone]  05/31/21, 1:32 PM

## 2021-05-31 NOTE — Progress Notes (Signed)
Pt consumed 5-10% of  meal. Ate entire banana as finger food. Attempted to grab spoon and bring to her mouth. Katie B, with OT will bring adaptive handle spoon for pt to try. Lance Bosch, B. Is working to get pt a sippy cup to assist. Pt appears to want to feed her self, however must be handed bites 1 at a time to raise to her mouth.

## 2021-05-31 NOTE — TOC Progression Note (Signed)
Transition of Care [facility]) - Progression Note    Patient Details  Name: Sabrina Johnston MRN: [id number] Date of Birth: May 05, 1956  [facility] Care [facility] CM/SW Contact  Allayne Butcher, RN Phone Number: 05/31/2021, 10:42 AM  Clinical Narrative:    RNCM spoke with patient's legal guardian, Sabrina Johnston, patient's sister, who lives in [place].  Sabrina Johnston just wanted an update and the plan for when patient is ready to be discharged.  Plan for discharge will be for her to [facility].     Expected Discharge Plan: Group Home Barriers to Discharge: Continued Medical Work up  Expected Discharge Plan and Services Expected Discharge Plan: Group Home   Discharge Planning Services: CM Consult   Living arrangements for the past 2 months: Group Home                 DME Arranged: N/A DME Agency: NA       HH Arranged: NA           Social Determinants of Health (SDOH) Interventions    Readmission Risk Interventions No flowsheet data found.

## 2021-06-01 ENCOUNTER — Emergency Department: Payer: Medicare Other

## 2021-06-01 ENCOUNTER — Other Ambulatory Visit: Payer: Self-pay

## 2021-06-01 ENCOUNTER — Inpatient Hospital Stay
Admission: EM | Admit: 2021-06-01 | Discharge: 2021-08-07 | DRG: 871 | Disposition: A | Discharge status: Nursing Home (Includes ICF) | Payer: Medicare Other | Attending: Internal Medicine | Admitting: Internal Medicine

## 2021-06-01 DIAGNOSIS — E039 Hypothyroidism, unspecified: Secondary | ICD-10-CM | POA: Diagnosis present

## 2021-06-01 DIAGNOSIS — W19XXXA Unspecified fall, initial encounter: Secondary | ICD-10-CM

## 2021-06-01 DIAGNOSIS — R946 Abnormal results of thyroid function studies: Secondary | ICD-10-CM | POA: Diagnosis present

## 2021-06-01 DIAGNOSIS — M6282 Rhabdomyolysis: Secondary | ICD-10-CM | POA: Diagnosis present

## 2021-06-01 DIAGNOSIS — T68XXXA Hypothermia, initial encounter: Secondary | ICD-10-CM

## 2021-06-01 DIAGNOSIS — A4151 Sepsis due to Escherichia coli [E. coli]: Principal | ICD-10-CM | POA: Diagnosis present

## 2021-06-01 DIAGNOSIS — G9341 Metabolic encephalopathy: Secondary | ICD-10-CM | POA: Diagnosis present

## 2021-06-01 DIAGNOSIS — R4182 Altered mental status, unspecified: Secondary | ICD-10-CM

## 2021-06-01 DIAGNOSIS — R5383 Other fatigue: Secondary | ICD-10-CM

## 2021-06-01 DIAGNOSIS — R451 Restlessness and agitation: Secondary | ICD-10-CM | POA: Diagnosis not present

## 2021-06-01 DIAGNOSIS — G40409 Other generalized epilepsy and epileptic syndromes, not intractable, without status epilepticus: Secondary | ICD-10-CM | POA: Diagnosis present

## 2021-06-01 DIAGNOSIS — M25569 Pain in unspecified knee: Secondary | ICD-10-CM

## 2021-06-01 DIAGNOSIS — N179 Acute kidney failure, unspecified: Secondary | ICD-10-CM | POA: Diagnosis not present

## 2021-06-01 DIAGNOSIS — R7881 Bacteremia: Secondary | ICD-10-CM | POA: Diagnosis present

## 2021-06-01 DIAGNOSIS — Z91198 Patient's noncompliance with other medical treatment and regimen for other reason: Secondary | ICD-10-CM

## 2021-06-01 DIAGNOSIS — F05 Delirium due to known physiological condition: Secondary | ICD-10-CM | POA: Diagnosis not present

## 2021-06-01 DIAGNOSIS — Z79899 Other long term (current) drug therapy: Secondary | ICD-10-CM

## 2021-06-01 DIAGNOSIS — M549 Dorsalgia, unspecified: Secondary | ICD-10-CM

## 2021-06-01 DIAGNOSIS — E872 Acidosis, unspecified: Secondary | ICD-10-CM

## 2021-06-01 DIAGNOSIS — G40309 Generalized idiopathic epilepsy and epileptic syndromes, not intractable, without status epilepticus: Secondary | ICD-10-CM | POA: Diagnosis present

## 2021-06-01 DIAGNOSIS — Z20822 Contact with and (suspected) exposure to covid-19: Secondary | ICD-10-CM | POA: Diagnosis present

## 2021-06-01 DIAGNOSIS — L89892 Pressure ulcer of other site, stage 2: Secondary | ICD-10-CM | POA: Diagnosis not present

## 2021-06-01 DIAGNOSIS — R197 Diarrhea, unspecified: Secondary | ICD-10-CM | POA: Diagnosis not present

## 2021-06-01 DIAGNOSIS — G4700 Insomnia, unspecified: Secondary | ICD-10-CM

## 2021-06-01 DIAGNOSIS — S0990XA Unspecified injury of head, initial encounter: Secondary | ICD-10-CM

## 2021-06-01 DIAGNOSIS — I959 Hypotension, unspecified: Secondary | ICD-10-CM | POA: Diagnosis not present

## 2021-06-01 DIAGNOSIS — L8992 Pressure ulcer of unspecified site, stage 2: Secondary | ICD-10-CM

## 2021-06-01 DIAGNOSIS — R4587 Impulsiveness: Secondary | ICD-10-CM | POA: Diagnosis present

## 2021-06-01 DIAGNOSIS — R6250 Unspecified lack of expected normal physiological development in childhood: Secondary | ICD-10-CM | POA: Diagnosis present

## 2021-06-01 DIAGNOSIS — S0003XA Contusion of scalp, initial encounter: Secondary | ICD-10-CM | POA: Diagnosis present

## 2021-06-01 DIAGNOSIS — R7401 Elevation of levels of liver transaminase levels: Secondary | ICD-10-CM

## 2021-06-01 DIAGNOSIS — G9340 Encephalopathy, unspecified: Secondary | ICD-10-CM

## 2021-06-01 DIAGNOSIS — E860 Dehydration: Secondary | ICD-10-CM | POA: Diagnosis present

## 2021-06-01 DIAGNOSIS — D539 Nutritional anemia, unspecified: Secondary | ICD-10-CM | POA: Diagnosis present

## 2021-06-01 DIAGNOSIS — K5909 Other constipation: Secondary | ICD-10-CM | POA: Diagnosis present

## 2021-06-01 DIAGNOSIS — R7989 Other specified abnormal findings of blood chemistry: Secondary | ICD-10-CM

## 2021-06-01 DIAGNOSIS — G9389 Other specified disorders of brain: Secondary | ICD-10-CM | POA: Diagnosis present

## 2021-06-01 DIAGNOSIS — Z9181 History of falling: Secondary | ICD-10-CM

## 2021-06-01 DIAGNOSIS — Q048 Other specified congenital malformations of brain: Secondary | ICD-10-CM

## 2021-06-01 DIAGNOSIS — F72 Severe intellectual disabilities: Secondary | ICD-10-CM | POA: Diagnosis present

## 2021-06-01 DIAGNOSIS — Z8673 Personal history of transient ischemic attack (TIA), and cerebral infarction without residual deficits: Secondary | ICD-10-CM

## 2021-06-01 DIAGNOSIS — G40909 Epilepsy, unspecified, not intractable, without status epilepticus: Secondary | ICD-10-CM

## 2021-06-01 DIAGNOSIS — E875 Hyperkalemia: Secondary | ICD-10-CM | POA: Diagnosis present

## 2021-06-01 DIAGNOSIS — M25561 Pain in right knee: Secondary | ICD-10-CM

## 2021-06-01 DIAGNOSIS — N390 Urinary tract infection, site not specified: Secondary | ICD-10-CM | POA: Diagnosis present

## 2021-06-01 DIAGNOSIS — L259 Unspecified contact dermatitis, unspecified cause: Secondary | ICD-10-CM | POA: Diagnosis present

## 2021-06-01 DIAGNOSIS — Z66 Do not resuscitate: Secondary | ICD-10-CM | POA: Diagnosis not present

## 2021-06-01 DIAGNOSIS — D75839 Thrombocytosis, unspecified: Secondary | ICD-10-CM | POA: Diagnosis present

## 2021-06-01 LAB — CBC WITH DIFFERENTIAL/PLATELET
Abs Immature Granulocytes: 0.43 10*3/uL — ABNORMAL HIGH (ref 0.00–0.07)
Basophils Absolute: 0.1 10*3/uL (ref 0.0–0.1)
Basophils Relative: 1 %
Eosinophils Absolute: 0.1 10*3/uL (ref 0.0–0.5)
Eosinophils Relative: 1 %
HCT: 32.5 % — ABNORMAL LOW (ref 36.0–46.0)
Hemoglobin: 11.5 g/dL — ABNORMAL LOW (ref 12.0–15.0)
Immature Granulocytes: 5 %
Lymphocytes Relative: 31 %
Lymphs Abs: 3 10*3/uL (ref 0.7–4.0)
MCH: 37 pg — ABNORMAL HIGH (ref 26.0–34.0)
MCHC: 35.4 g/dL (ref 30.0–36.0)
MCV: 104.5 fL — ABNORMAL HIGH (ref 80.0–100.0)
Monocytes Absolute: 1.8 10*3/uL — ABNORMAL HIGH (ref 0.1–1.0)
Monocytes Relative: 19 %
Neutro Abs: 4.3 10*3/uL (ref 1.7–7.7)
Neutrophils Relative %: 43 %
Platelets: 236 10*3/uL (ref 150–400)
RBC: 3.11 MIL/uL — ABNORMAL LOW (ref 3.87–5.11)
RDW: 14.7 % (ref 11.5–15.5)
WBC: 9.6 10*3/uL (ref 4.0–10.5)
nRBC: 0.8 % — ABNORMAL HIGH (ref 0.0–0.2)

## 2021-06-01 LAB — COMPREHENSIVE METABOLIC PANEL
ALT: 32 U/L (ref 0–44)
AST: 74 U/L — ABNORMAL HIGH (ref 15–41)
Albumin: 2 g/dL — ABNORMAL LOW (ref 3.5–5.0)
Alkaline Phosphatase: 142 U/L — ABNORMAL HIGH (ref 38–126)
Anion gap: 10 (ref 5–15)
BUN: <5 mg/dL — ABNORMAL LOW (ref 8–23)
CO2: 23 mmol/L (ref 22–32)
Calcium: 8.2 mg/dL — ABNORMAL LOW (ref 8.9–10.3)
Chloride: 110 mmol/L (ref 98–111)
Creatinine, Ser: 0.79 mg/dL (ref 0.44–1.00)
GFR, Estimated: >60 mL/min (ref >60)
Glucose, Bld: 83 mg/dL (ref 70–99)
Potassium: 4 mmol/L (ref 3.5–5.1)
Sodium: 143 mmol/L (ref 135–145)
Total Bilirubin: 0.6 mg/dL (ref 0.3–1.2)
Total Protein: 5.2 g/dL — ABNORMAL LOW (ref 6.5–8.1)

## 2021-06-01 LAB — BASIC METABOLIC PANEL
Anion gap: 9 (ref 5–15)
BUN: 6 mg/dL — ABNORMAL LOW (ref 8–23)
CO2: 25 mmol/L (ref 22–32)
Calcium: 7.7 mg/dL — ABNORMAL LOW (ref 8.9–10.3)
Chloride: 108 mmol/L (ref 98–111)
Creatinine, Ser: 0.56 mg/dL (ref 0.44–1.00)
GFR, Estimated: >60 mL/min (ref >60)
Glucose, Bld: 116 mg/dL — ABNORMAL HIGH (ref 70–99)
Potassium: 4.2 mmol/L (ref 3.5–5.1)
Sodium: 142 mmol/L (ref 135–145)

## 2021-06-01 LAB — CBC
HCT: 33.5 % — ABNORMAL LOW (ref 36.0–46.0)
Hemoglobin: 11.6 g/dL — ABNORMAL LOW (ref 12.0–15.0)
MCH: 35.7 pg — ABNORMAL HIGH (ref 26.0–34.0)
MCHC: 34.6 g/dL (ref 30.0–36.0)
MCV: 103.1 fL — ABNORMAL HIGH (ref 80.0–100.0)
Platelets: 177 10*3/uL (ref 150–400)
RBC: 3.25 MIL/uL — ABNORMAL LOW (ref 3.87–5.11)
RDW: 14.6 % (ref 11.5–15.5)
WBC: 7.1 10*3/uL (ref 4.0–10.5)
nRBC: 0.8 % — ABNORMAL HIGH (ref 0.0–0.2)

## 2021-06-01 LAB — MAGNESIUM: Magnesium: 1.9 mg/dL (ref 1.7–2.4)

## 2021-06-01 LAB — T4, FREE: Free T4: 1.18 ng/dL — ABNORMAL HIGH (ref 0.61–1.12)

## 2021-06-01 LAB — PHOSPHORUS: Phosphorus: 2.1 mg/dL — ABNORMAL LOW (ref 2.5–4.6)

## 2021-06-01 LAB — PROTIME-INR
INR: 1.1 (ref 0.8–1.2)
Prothrombin Time: 13.9 seconds (ref 11.4–15.2)

## 2021-06-01 LAB — LACTIC ACID, PLASMA
Lactic Acid, Venous: 5.6 mmol/L (ref 0.5–1.9)
Lactic Acid, Venous: 3.2 mmol/L (ref 0.5–1.9)

## 2021-06-01 LAB — T3, FREE: T3, Free: 2.1 pg/mL (ref 2.0–4.4)

## 2021-06-01 LAB — POTASSIUM: Potassium: 4.1 mmol/L (ref 3.5–5.1)

## 2021-06-01 LAB — AMMONIA: Ammonia: 44 umol/L — ABNORMAL HIGH (ref 9–35)

## 2021-06-01 MED ORDER — SODIUM CHLORIDE 0.9 % IV BOLUS
1000.0000 mL | Freq: Once | INTRAVENOUS | Status: AC
  Administered 2021-06-01: 1000 mL via INTRAVENOUS

## 2021-06-01 MED ORDER — SODIUM CHLORIDE 0.9 % IV SOLN
1.0000 g | Freq: Once | INTRAVENOUS | Status: AC
  Administered 2021-06-01: 1 g via INTRAVENOUS
  Filled 2021-06-01: qty 10

## 2021-06-01 MED ORDER — DROPERIDOL 2.5 MG/ML IJ SOLN
5.0000 mg | Freq: Once | INTRAMUSCULAR | Status: AC
  Administered 2021-06-01: 5 mg via INTRAVENOUS
  Filled 2021-06-01: qty 2

## 2021-06-01 MED ORDER — LACTATED RINGERS IV SOLN: INTRAVENOUS | Status: DC

## 2021-06-01 MED ORDER — POTASSIUM CHLORIDE 10 MEQ/100ML IV SOLN
10.0000 meq | Freq: Once | INTRAVENOUS | Status: AC
  Administered 2021-06-01: 06:00:00 10 meq via INTRAVENOUS

## 2021-06-01 MED ORDER — DIVALPROEX SODIUM 500 MG PO DR TAB
500.0000 mg | DELAYED_RELEASE_TABLET | Freq: Two times a day (BID) | ORAL | Status: DC
  Filled 2021-06-01 (×2): qty 1

## 2021-06-01 MED ORDER — CEPHALEXIN 500 MG PO CAPS: 500.0000 mg | ORAL_CAPSULE | Freq: Four times a day (QID) | ORAL | Status: DC

## 2021-06-01 MED ORDER — CEPHALEXIN 500 MG PO CAPS: 500.0000 mg | ORAL_CAPSULE | Freq: Four times a day (QID) | ORAL | 0 refills | Status: DC

## 2021-06-01 MED ORDER — LORAZEPAM 2 MG/ML IJ SOLN
2.0000 mg | Freq: Once | INTRAMUSCULAR | Status: AC
  Administered 2021-06-01: 2 mg via INTRAVENOUS
  Filled 2021-06-01: qty 1

## 2021-06-01 MED ORDER — K PHOS MONO-SOD PHOS DI & MONO 155-852-130 MG PO TABS
500.0000 mg | ORAL_TABLET | Freq: Three times a day (TID) | ORAL | Status: DC
  Administered 2021-06-01: 500 mg via ORAL
  Filled 2021-06-01 (×3): qty 2

## 2021-06-01 MED ORDER — DIVALPROEX SODIUM 125 MG PO CSDR
500.0000 mg | DELAYED_RELEASE_CAPSULE | Freq: Two times a day (BID) | ORAL | Status: DC
  Filled 2021-06-01 (×2): qty 4

## 2021-06-01 NOTE — Discharge Summary (Signed)
[facility] Discharge Summary   Patient: KARON COTTERILL XVQ:[id number]  PCP: Kirk Ruths, MD  Date of admission: 05/26/2021   Date of discharge:  06/01/2021     Discharge Diagnoses:  Principal diagnosis Delirium, altered mental status Active Problems:   Dehydration   Altered mental status   Hypotension   Sepsis due to Escherichia coli with encephalopathy ([place])   Seizure disorder ([place])   Acute metabolic encephalopathy   Admitted From: Group home Disposition: Group home  Recommendations for Outpatient Follow-up:  PCP: in 1 wk Neurologist as per schedule Follow up LABS/TEST:     Diet recommendation: Regular diet  Activity: The patient is advised to gradually reintroduce usual activities, as tolerated  Discharge Condition: stable  Code Status: Full code   History of present illness: As per the H and P dictated on admission Hospital Course:  Sabrina Johnston is a 65 y.o. female with medical history significant for cognitive and neurobehavioral dysfunction, seizure disorder and chronic constipation, who presented to the emergency room with acute onset of generalized weakness and altered mental status.  The patient has not been ambulating over the last 24 hours.  She has not been eating or drinking over the last couple of days.  She is unable to provide any history due to her baseline mental status however according to the legal guardian who lives in [place] she is nonverbal at baseline but able to walk somewhat.  Also history of recent multiple falls.  She was mostly dependent for her ADLs.  Sometimes able to feed herself but need a lot of supervision. Pt was found to have mild leukocytosis, mildly elevated TSH, mild lactic acidosis in twos, UA with some bacteria and mild leukocytosis, urine and blood cultures pending Abdominal and chest x-ray without any acute abnormalities.  Pelvic x-rays with no fractures.  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 Blood culture came back positive for E. coli and she was started on ceftriaxone.  Lactic acid remained around 2.2. Repeat CT head was without any new abnormality. According to her legal guardian at her facility she was sleeping most of the time since she came to that facility.  Normally to get up to eat but they have noticed that she was becoming more and more lethargic and does not want to eat for the past couple of weeks. Neurology was also consulted-EEG done today with Ativan as she became combative yesterday during EEG.  Pending results.    Assessment and Plan: Active Problems:   Dehydration   Altered mental status   Hypotension   Sepsis due to Escherichia coli with encephalopathy (HCC)   Seizure disorder (HCC)   Altered mental status, most likely secondary to UTI, sepsis, E. coli bacteremia Patient was sleeping most of the time for the past 2 days, not interested in eating or drinking although some response to painful stimuli.  Similar behavior at her group home per legal guardian. CT head without any acute abnormality, similar changes were observed on prior imaging.  ABG with very mild hypoxia and no hypercarbia. There was some concern of decreased p.o. intake and dehydration.  Per legal guardian she was told that patient was not eating well and having multiple falls recently. Unable to explain any symptoms due to underlying severe cognitive impairment.  At baseline she is nonverbal and mostly dependent on  others for ADLs.  -Neurology consult to rule out any other underlying issue-they advised EEG, which did not show any seizure-like activity.  Patient's mental status is better now, most likely secondary to UTI and bacteremia causing AMS.  Patient was cleared by neurology to discharge. Sepsis secondary to E. coli bacteremia.  Patient  initially met SIRS criteria with leukocytosis, tachycardia and tachypnea and was not declared as septic as there was no obvious source of infection. Later urine and blood cultures with E. coli so she met sepsis criteria.  She was started on ceftriaxone.  E. coli sensitive to ceftriaxone and Cipro. 8/17 repeat blood cultures NGTD.  Antibiotics transition to oral Keflex 500 Q6 hourly for 7 additional days. # Hypokalemia, potassium repleted. # Seizure disorder.Continue home dose of Depakote and Valium.  Medications were switched to IV during hospital stay as patient was not taking p.o., resumed home medications on discharge.  Recommended to follow with neurology as an outpatient. # Multiple sclerosis.  No obvious flare.  # Physical deconditioning/multiple falls.  Continue supportive care and fall precautions. # Chronic constipation, Continue with bowel regimen # Abnormal thyroid function test, elevated TSH level 10.2 and slightly elevated free T4 level 1.21 Recommend to follow with endocrine as an outpatient Body mass index is 28.72 kg/m.  Interventions:  On the day of the discharge the patient's vitals were stable, and no other acute medical condition were reported by patient. the patient was felt safe to be discharge at group home  Consultants: Neurologist Procedures: EEG  Discharge Exam: General: Appear in no distress, No Rash; Oral Mucosa Clear, moist. Cardiovascular: S1 and S2 Present, no Murmur, Respiratory: normal respiratory effort, Bilateral Air entry present and no Crackles, no wheezes Abdomen: Bowel Sound present, Soft and no tenderness, no hernia Extremities: no Pedal edema, no calf tenderness Neurology: alert, nonverbal at baseline, unable to follow commands, mentally challenged  [facility]   05/26/21 2026 05/27/21 0626  Weight: 67.1 kg 66.7 kg   Vitals:   06/01/21 0837 06/01/21 1210  BP: 122/66 130/72  Pulse: 75 78  Resp: 18 19  Temp: 97.8 F (36.6 C) 98.1 F (36.7  C)  SpO2: 99% 99%    DISCHARGE MEDICATION: Allergies as of 06/01/2021   No Known Allergies      Medication List     TAKE these medications    acetaminophen 325 MG tablet Commonly known as: TYLENOL Take 650 mg by mouth every 6 (six) hours as needed.   aluminum-magnesium hydroxide-simethicone 865-784-69 MG/5ML Susp Commonly known as: MAALOX Take 30 mLs by mouth 4 (four) times daily -  before meals and at bedtime.   bacitracin-polymyxin b ointment Commonly known as: POLYSPORIN Apply topically 2 (two) times daily.   bisacodyl 10 MG suppository Commonly known as: DULCOLAX Place 10 mg rectally as needed for moderate constipation.   Calcium Citrate-Vitamin D 200-250 MG-UNIT Tabs Take 1 tablet by mouth 2 (two) times daily.   carbamide peroxide 6.5 % OTIC solution Commonly known as: DEBROX 5 drops 2 (two) times daily.   cephALEXin 500 MG capsule Commonly known as: KEFLEX Take 1 capsule (500 mg total) by mouth 4 (four) times daily for 7 days.   CERTAGEN SILVER PO Take 1 tablet by mouth daily.   cetirizine 10 MG tablet Commonly known as: ZYRTEC Take 10 mg by mouth daily.   Cholecalciferol 50 MCG (2000 UT) Caps Take 1 capsule by mouth daily.   divalproex 500 MG DR tablet Commonly known as: DEPAKOTE Take 500 mg  by mouth 2 (two) times daily.   DULoxetine 30 MG capsule Commonly known as: CYMBALTA Take 30 mg by mouth daily.   DULoxetine 60 MG capsule Commonly known as: CYMBALTA Take 60 mg by mouth daily.   furosemide 20 MG tablet Commonly known as: LASIX Take 20 mg by mouth daily.   guaifenesin 100 MG/5ML syrup Commonly known as: ROBITUSSIN Take 200 mg by mouth 3 (three) times daily as needed for cough.   hydrOXYzine 25 MG tablet Commonly known as: ATARAX/VISTARIL Take 25 mg by mouth 3 (three) times daily as needed.   lanolin-mineral oil Lotn Apply topically as needed.   loperamide 2 MG capsule Commonly known as: IMODIUM Take by mouth as needed for  diarrhea or loose stools.   magnesium hydroxide 800 MG/5ML suspension Commonly known as: MILK OF MAGNESIA Take by mouth daily as needed for constipation.   senna 8.6 MG tablet Commonly known as: SENOKOT TAKE 2 TABLETS BY MOUTH TWO TIMES A DAY.   tolnaftate 1 % powder Commonly known as: TINACTIN Apply 1 application topically 2 (two) times daily.       No Known Allergies Discharge Instructions     Call MD for:  persistant nausea and vomiting   Complete by: As directed    Call MD for:  severe uncontrolled pain   Complete by: As directed    Call MD for:  temperature >100.4   Complete by: As directed    Diet - low sodium heart healthy   Complete by: As directed    Discharge instructions   Complete by: As directed    Follow with PCP, patient should be seen by an MD in 2 to 3 days   Increase activity slowly   Complete by: As directed        The results of significant diagnostics from this hospitalization (including imaging, microbiology, ancillary and laboratory) are listed below for reference.    Significant Diagnostic Studies: DG Abdomen 1 View  Result Date: 05/27/2021 CLINICAL DATA:  MRI screening. EXAM: ABDOMEN - 1 VIEW COMPARISON:  None. FINDINGS: The study is limited secondary to patient motion. The bowel gas pattern is normal. A moderate amount of stool is seen throughout the colon. No radio-opaque calculi or other significant radiographic abnormality are seen. No radiopaque metallic density foci are seen. IMPRESSION: Negative. Electronically Signed   By: Virgina Norfolk M.D.   On: 05/27/2021 00:55   CT HEAD WO CONTRAST (5MM)  Result Date: 05/28/2021 CLINICAL DATA:  Mental status change, persistent or worsening. Cognitive and neuro behavioral dysfunction. Seizure disorder. EXAM: CT HEAD WITHOUT CONTRAST TECHNIQUE: Contiguous axial images were obtained from the base of the skull through the vertex without intravenous contrast. COMPARISON:  MRI yesterday.  CT 05/26/2021.  FINDINGS: Brain: There is some sort of artifact on the axial imaging. This does not appear to be present on the coronal and sagittal reformations. No change or acute finding is appreciated. Old infarction in the right frontal lobe and right caudate head. Chronic ventriculomegaly and colpocephaly of both hemispheres, most likely related to prenatal or perinatal insult. No evidence of mass, hemorrhage, obstructive hydrocephalus or extra-axial collection. Vascular: No abnormal vascular finding. Skull: No significant calvarial finding. Sinuses/Orbits: Mucosal thickening of the maxillary sinuses right more than left. No layering fluid. Orbits negative. Other: None IMPRESSION: Some sort of artifact on the axial imaging, not seen on the sagittal or coronal imaging in therefore probably related to some sort of reconstruction problem. No apparent change since the MRI of  yesterday or CT 2 days ago. 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 Chronic ventriculomegaly without change. Old infarction in the right frontal lobe and right caudate. Electronically Signed   By: Nelson Chimes M.D.   On: 05/28/2021 12:15   CT HEAD WO CONTRAST (5MM)  Result Date: 05/26/2021 CLINICAL DATA:  Mental status change right-sided weakness. EXAM: CT HEAD WITHOUT CONTRAST CT MAXILLOFACIAL WITHOUT CONTRAST CT CERVICAL SPINE WITHOUT CONTRAST TECHNIQUE: Multidetector CT imaging of the head, cervical spine, and maxillofacial structures were performed using the standard protocol without intravenous contrast. Multiplanar CT image reconstructions of the cervical spine and maxillofacial structures were also generated. COMPARISON:  None. FINDINGS: CT HEAD FINDINGS Brain: Bilateral parieto-occipital encephalomalacia noted with ex vacuo dilation the ventricles. Remote anterior right frontal lobe infarct present. Remote lacunar infarct is present right caudate head. Basal ganglia are otherwise intact. Mild cerebellar atrophy  noted. Hemorrhage or mass lesion is present. No significant extraaxial fluid collection is present. Vascular: No hyperdense vessel or unexpected calcification. Skull: Calvarium is intact. No focal lytic or blastic lesions are present. No significant extracranial soft tissue lesion is present. CT MAXILLOFACIAL FINDINGS Osseous: No acute or healing fractures are present. Mandible is intact and located. Patient is edentulous. Orbits: The globes and orbits are within normal limits. Sinuses: The paranasal sinuses and mastoid air cells are clear. Soft tissues: The soft tissues of face are unremarkable. CT CERVICAL SPINE FINDINGS Alignment: No significant listhesis is present. Straightening of the normal cervical lordosis is present. Skull base and vertebrae: Craniocervical junction is within normal limits. Vertebral body heights are maintained. No acute or healing fractures are present. Soft tissues and spinal canal: No prevertebral fluid or swelling. No visible canal hematoma. Disc levels: Uncovertebral and facet disease is present in the cervical spine. Foraminal narrowing is greatest at C3-4, right greater than left. Facet hypertrophy results in moderate left foraminal stenosis at C2-3. Upper chest: The lung apices are clear. Thoracic inlet is within normal limits. IMPRESSION: 1. 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 2. Remote anterior right frontal lobe infarct. 3. 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 4. No acute intracranial abnormality. 5. No acute or healing fractures of the face. 6. Multilevel degenerative changes of the cervical spine without acute fracture or traumatic subluxation. Electronically Signed   By: San Morelle M.D.   On: 05/26/2021 21:22   CT Cervical Spine Wo Contrast  Result Date: 05/26/2021 CLINICAL DATA:  Mental status change right-sided weakness. EXAM: CT HEAD WITHOUT CONTRAST CT MAXILLOFACIAL WITHOUT CONTRAST CT  CERVICAL SPINE WITHOUT CONTRAST TECHNIQUE: Multidetector CT imaging of the head, cervical spine, and maxillofacial structures were performed using the standard protocol without intravenous contrast. Multiplanar CT image reconstructions of the cervical spine and maxillofacial structures were also generated. COMPARISON:  None. FINDINGS: CT HEAD FINDINGS Brain: Bilateral parieto-occipital encephalomalacia noted with ex vacuo dilation the ventricles. Remote anterior right frontal lobe infarct present. Remote lacunar infarct is present right caudate head. Basal ganglia are otherwise intact. Mild cerebellar atrophy noted. Hemorrhage or mass lesion is present. No significant extraaxial fluid collection is present. Vascular: No hyperdense vessel or unexpected calcification. Skull: Calvarium is intact. No focal lytic or blastic lesions are present. No significant extracranial soft tissue lesion is present. CT MAXILLOFACIAL FINDINGS Osseous: No acute or healing fractures are present. Mandible is intact and located. Patient is edentulous. Orbits: The globes and orbits are within normal limits. Sinuses: The paranasal sinuses and mastoid air cells are clear.  Soft tissues: The soft tissues of face are unremarkable. CT CERVICAL SPINE FINDINGS Alignment: No significant listhesis is present. Straightening of the normal cervical lordosis is present. Skull base and vertebrae: Craniocervical junction is within normal limits. Vertebral body heights are maintained. No acute or healing fractures are present. Soft tissues and spinal canal: No prevertebral fluid or swelling. No visible canal hematoma. Disc levels: Uncovertebral and facet disease is present in the cervical spine. Foraminal narrowing is greatest at C3-4, right greater than left. Facet hypertrophy results in moderate left foraminal stenosis at C2-3. Upper chest: The lung apices are clear. Thoracic inlet is within normal limits. IMPRESSION: 1. Bilateral chronic  parieto-occipital encephalomalacia with ex vacuo dilation the ventricles. 2. Remote anterior right frontal lobe infarct. 3. 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 4. No acute intracranial abnormality. 5. No acute or healing fractures of the face. 6. Multilevel degenerative changes of the cervical spine without acute fracture or traumatic subluxation. Electronically Signed   By: San Morelle M.D.   On: 05/26/2021 21:22   MR BRAIN WO CONTRAST  Result Date: 05/27/2021 CLINICAL DATA:  Initial evaluation for right-sided weakness, decreased PO intake. EXAM: MRI HEAD WITHOUT CONTRAST TECHNIQUE: Multiplanar, multiecho pulse sequences of the brain and surrounding structures were obtained without intravenous contrast. COMPARISON:  CT from 05/26/2021. FINDINGS: Brain: Examination severely degraded by motion artifact, with several sequence markedly degraded and essentially nondiagnostic. 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 Marked thinning of the overlying cortical mantle. Findings presumably chronic and congenital in nature. No visible transependymal flow of CSF on this limited exam. 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 The cerebellum and brainstem appear more normally formed. Focal area of probable encephalomalacia at the anterior right frontal lobe, suggesting prior right MCA distribution infarct. Associated remote lacunar infarct at the right caudate better seen on prior CT. No convincing foci of restricted diffusion to suggest acute or subacute ischemia. No other visible areas of chronic infarction. No convincing foci of susceptibility artifact to suggest acute or chronic intracranial hemorrhage. No visible mass lesion or mass effect. No midline shift. No visible extra-axial fluid collection. Vascular: Major intracranial vascular flow voids are poorly assessed on this limited exam. Normal flow void is seen within the partially visualized  basilar artery. Skull and upper cervical spine: Craniocervical junction not well assessed due to motion. Bone marrow signal intensity grossly within normal limits. No visible scalp soft tissue abnormality. Sinuses/Orbits: Globes and orbital soft tissues demonstrate no obvious abnormality. Paranasal sinuses are largely clear. Small chronic appearing right mastoid effusion noted. Other: None. IMPRESSION: 1. Severely limited exam due to extensive motion artifact. 2. No definite acute intracranial infarct or other abnormality identified. 3. 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 4. 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 Electronically Signed   By: Jeannine Boga M.D.   On: 05/27/2021 03:23   DG Pelvis Portable  Result Date: 05/26/2021 CLINICAL DATA:  Falls EXAM: PORTABLE PELVIS 1-2 VIEWS COMPARISON:  None. FINDINGS: There is no evidence of pelvic fracture or diastasis. No pelvic bone lesions are seen. IMPRESSION: Negative. Electronically Signed   By: Donavan Foil M.D.   On: 05/26/2021 22:54   DG Chest Portable 1 View  Result Date: 05/26/2021 CLINICAL DATA:  Fall weakness EXAM: PORTABLE CHEST 1 VIEW COMPARISON:  Report 01/21/2015 FINDINGS: Single-view chest demonstrates no focal opacity or pleural effusion. Normal cardiomediastinal silhouette. No pneumothorax. IMPRESSION: No active disease. Electronically  Signed   By: Donavan Foil M.D.   On: 05/26/2021 22:54   EEG adult  Result Date: 05/30/2021 Derek Jack, MD     05/30/2021  6:50 PM Routine EEG Report ANTONIETTA LANSDOWNE is a 65 y.o. female with a history of seizures who is undergoing an EEG to evaluate for seizures. Report: This EEG was acquired with electrodes placed according to the International 10-20 electrode system (including Fp1, Fp2, F3, F4, C3, C4, P3, P4, O1, O2, T3, T4, T5, T6, A1, A2, Fz, Cz, Pz). The following electrodes  were missing or displaced: none. There was no occipital dominant rhythm. The best background was low voltage fast with occasional delta slowing over the right frontal region. This activity is reactive to stimulation. Drowsiness was manifested by background fragmentation; deeper stages of sleep were not identified. There was no focal slowing. There were no interictal epileptiform discharges. There were no electrographic seizures identified. There was no abnormal response to photic stimulation or hyperventilation. Impression and clinical correlation: This EEG was obtained while awake and drowsy and is abnormal due to intermittent slowing over the right frontal region over the area of remote infarct seen on MRI. There was no seizure activity observed during this recording. Su Monks, MD [facility] [phone] If 7pm- 7am, please page neurology on call as listed in [place].   CT Maxillofacial Wo Contrast  Result Date: 05/26/2021 CLINICAL DATA:  Mental status change right-sided weakness. EXAM: CT HEAD WITHOUT CONTRAST CT MAXILLOFACIAL WITHOUT CONTRAST CT CERVICAL SPINE WITHOUT CONTRAST TECHNIQUE: Multidetector CT imaging of the head, cervical spine, and maxillofacial structures were performed using the standard protocol without intravenous contrast. Multiplanar CT image reconstructions of the cervical spine and maxillofacial structures were also generated. COMPARISON:  None. FINDINGS: CT HEAD FINDINGS Brain: Bilateral parieto-occipital encephalomalacia noted with ex vacuo dilation the ventricles. Remote anterior right frontal lobe infarct present. Remote lacunar infarct is present right caudate head. Basal ganglia are otherwise intact. Mild cerebellar atrophy noted. Hemorrhage or mass lesion is present. No significant extraaxial fluid collection is present. Vascular: No hyperdense vessel or unexpected calcification. Skull: Calvarium is intact. No focal lytic or blastic lesions are present. No  significant extracranial soft tissue lesion is present. CT MAXILLOFACIAL FINDINGS Osseous: No acute or healing fractures are present. Mandible is intact and located. Patient is edentulous. Orbits: The globes and orbits are within normal limits. Sinuses: The paranasal sinuses and mastoid air cells are clear. Soft tissues: The soft tissues of face are unremarkable. CT CERVICAL SPINE FINDINGS Alignment: No significant listhesis is present. Straightening of the normal cervical lordosis is present. Skull base and vertebrae: Craniocervical junction is within normal limits. Vertebral body heights are maintained. No acute or healing fractures are present. Soft tissues and spinal canal: No prevertebral fluid or swelling. No visible canal hematoma. Disc levels: Uncovertebral and facet disease is present in the cervical spine. Foraminal narrowing is greatest at C3-4, right greater than left. Facet hypertrophy results in moderate left foraminal stenosis at C2-3. Upper chest: The lung apices are clear. Thoracic inlet is within normal limits. IMPRESSION: 1. 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 2. Remote anterior right frontal lobe infarct. 3. 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 4. No acute intracranial abnormality. 5. No acute or healing fractures of the face. 6. Multilevel degenerative changes of the cervical spine without acute fracture or traumatic subluxation. Electronically Signed   By: San Morelle M.D.   On: 05/26/2021 21:22    Microbiology:  Recent Results (from the past 240 hour(s))  Resp Panel by RT-PCR (Flu A&B, Covid)     Status: None   Collection Time: 05/26/21  8:40 PM   Specimen: Nasopharyngeal(NP) swabs in vial transport medium  Result Value Ref Range Status   SARS Coronavirus 2 by RT PCR NEGATIVE NEGATIVE Final    Comment: (NOTE) SARS-CoV-2 target nucleic acids are NOT DETECTED.  The SARS-CoV-2 RNA is generally detectable in  upper respiratory specimens during the acute phase of infection. The lowest concentration of SARS-CoV-2 viral copies this assay can detect is 138 copies/mL. A negative result does not preclude SARS-Cov-2 infection and should not be used as the sole basis for treatment or other patient management decisions. A negative result may occur with  improper specimen collection/handling, submission of specimen other than nasopharyngeal swab, presence of viral mutation(s) within the areas targeted by this assay, and inadequate number of viral copies(<138 copies/mL). A negative result must be combined with clinical observations, patient history, and epidemiological information. The expected result is Negative.  Fact Sheet for Patients:  EntrepreneurPulse.com.au  Fact Sheet for Healthcare Providers:  IncredibleEmployment.be  This test is no t yet approved or cleared by the [place] FDA and  has been authorized for detection and/or diagnosis of SARS-CoV-2 by FDA under an Emergency Use Authorization (EUA). This EUA will remain  in effect (meaning this test can be used) for the duration of the COVID-19 declaration under Section 564(b)(1) of the Act, 21 U.S.C.section 360bbb-3(b)(1), unless the authorization is terminated  or revoked sooner.       Influenza A by PCR NEGATIVE NEGATIVE Final   Influenza B by PCR NEGATIVE NEGATIVE Final    Comment: (NOTE) The Xpert Xpress SARS-CoV-2/FLU/RSV plus assay is intended as an aid in the diagnosis of influenza from Nasopharyngeal swab specimens and should not be used as a sole basis for treatment. Nasal washings and aspirates are unacceptable for Xpert Xpress SARS-CoV-2/FLU/RSV testing.  Fact Sheet for Patients: EntrepreneurPulse.com.au  Fact Sheet for Healthcare Providers: IncredibleEmployment.be  This test is not yet approved or cleared by the [place] FDA and has been  authorized for detection and/or diagnosis of SARS-CoV-2 by FDA under an Emergency Use Authorization (EUA). This EUA will remain in effect (meaning this test can be used) for the duration of the COVID-19 declaration under Section 564(b)(1) of the Act, 21 U.S.C. section 360bbb-3(b)(1), unless the authorization is terminated or revoked.  Performed at [facility], [address] [place], [place] [address]   Urine Culture     Status: Abnormal   Collection Time: 05/26/21  8:40 PM   Specimen: Urine, Clean Catch  Result Value Ref Range Status   Specimen Description   Final    URINE, CLEAN CATCH Performed at [facility], [address], [place], [place] [address]    Special Requests   Final    NONE Performed at [facility], [place], [place] [address]    Culture >=100,000 COLONIES/mL ESCHERICHIA COLI (A)  Final   Report Status 05/29/2021 FINAL  Final   Organism ID, Bacteria ESCHERICHIA COLI (A)  Final      Susceptibility   Escherichia coli - MIC*    AMPICILLIN >=32 RESISTANT Resistant     CEFAZOLIN <=4 SENSITIVE Sensitive     CEFEPIME <=0.12 SENSITIVE Sensitive     CEFTRIAXONE <=0.25 SENSITIVE Sensitive     CIPROFLOXACIN <=0.25 SENSITIVE Sensitive     GENTAMICIN >=16 RESISTANT Resistant     IMIPENEM <=  0.25 SENSITIVE Sensitive     NITROFURANTOIN <=16 SENSITIVE Sensitive     TRIMETH/SULFA >=320 RESISTANT Resistant     AMPICILLIN/SULBACTAM >=32 RESISTANT Resistant     PIP/TAZO <=4 SENSITIVE Sensitive     * >=100,000 COLONIES/mL ESCHERICHIA COLI  Blood culture (routine single)     Status: Abnormal   Collection Time: 05/27/21  9:57 AM   Specimen: BLOOD  Result Value Ref Range Status   Specimen Description   Final    BLOOD BLOOD RIGHT ARM Performed at [facility], [address] [place], [place] [address]    Special Requests   Final    BOTTLES DRAWN AEROBIC AND ANAEROBIC Blood Culture adequate volume Performed at [facility], [address] [place], [place] [address]    Culture  Setup Time   Final    GRAM NEGATIVE RODS IN BOTH AEROBIC AND ANAEROBIC BOTTLES CRITICAL RESULT CALLED TO, READ BACK BY AND VERIFIED WITH: NATHAN BELUE @ 5747 05/27/21 LFD Performed at [facility], [place] [address] [place], [place] [address]    Culture ESCHERICHIA COLI (A)  Final   Report Status 05/30/2021 FINAL  Final   Organism ID, Bacteria ESCHERICHIA COLI  Final      Susceptibility   Escherichia coli - MIC*    AMPICILLIN >=32 RESISTANT Resistant     CEFAZOLIN <=4 SENSITIVE Sensitive     CEFEPIME <=0.12 SENSITIVE Sensitive     CEFTAZIDIME <=1 SENSITIVE Sensitive     CEFTRIAXONE <=0.25 SENSITIVE Sensitive     CIPROFLOXACIN <=0.25 SENSITIVE Sensitive     GENTAMICIN >=16 RESISTANT Resistant     IMIPENEM <=0.25 SENSITIVE Sensitive     TRIMETH/SULFA >=320 RESISTANT Resistant     AMPICILLIN/SULBACTAM >=32 RESISTANT Resistant     PIP/TAZO <=4 SENSITIVE Sensitive     * ESCHERICHIA COLI  Blood Culture ID Panel (Reflexed)     Status: Abnormal (Preliminary result)   Collection Time: 05/27/21  9:57 AM  Result Value Ref Range Status   Enterococcus faecalis NOT DETECTED NOT DETECTED Final   Enterococcus Faecium NOT DETECTED NOT DETECTED Final   Listeria monocytogenes NOT DETECTED NOT DETECTED Final   Staphylococcus species NOT DETECTED NOT DETECTED Final   Staphylococcus aureus (BCID) PENDING NOT DETECTED Incomplete   Staphylococcus epidermidis PENDING NOT DETECTED Incomplete   Staphylococcus lugdunensis PENDING NOT DETECTED Incomplete   Streptococcus species NOT DETECTED NOT DETECTED Final   Streptococcus agalactiae NOT DETECTED NOT DETECTED Final   Streptococcus pneumoniae NOT DETECTED NOT DETECTED Final   Streptococcus pyogenes NOT DETECTED NOT DETECTED Final   A.calcoaceticus-baumannii NOT DETECTED NOT DETECTED Final   Bacteroides fragilis NOT DETECTED NOT DETECTED Final   Enterobacterales DETECTED (A) NOT  DETECTED Final    Comment: Enterobacterales represent a large order of gram negative bacteria, not a single organism. CRITICAL RESULT CALLED TO, READ BACK BY AND VERIFIED WITH: NATHAN BELUE @ 0964 05/27/21 LFD    Enterobacter cloacae complex NOT DETECTED NOT DETECTED Final   Escherichia coli DETECTED (A) NOT DETECTED Final    Comment: CRITICAL RESULT CALLED TO, READ BACK BY AND VERIFIED WITH: NATHAN BELUE @ 3838 05/27/21 LFD     Klebsiella aerogenes NOT DETECTED NOT DETECTED Final   Klebsiella oxytoca NOT DETECTED NOT DETECTED Final   Klebsiella pneumoniae NOT DETECTED NOT DETECTED Final   Proteus species NOT DETECTED NOT DETECTED Final   Salmonella species NOT DETECTED NOT DETECTED Final   Serratia marcescens NOT DETECTED NOT DETECTED Final   Haemophilus influenzae NOT  DETECTED NOT DETECTED Final   Neisseria meningitidis NOT DETECTED NOT DETECTED Final   Pseudomonas aeruginosa NOT DETECTED NOT DETECTED Final   Stenotrophomonas maltophilia NOT DETECTED NOT DETECTED Final   Candida albicans NOT DETECTED NOT DETECTED Final   Candida auris NOT DETECTED NOT DETECTED Final   Candida glabrata NOT DETECTED NOT DETECTED Final   Candida krusei NOT DETECTED NOT DETECTED Final   Candida parapsilosis NOT DETECTED NOT DETECTED Final   Candida tropicalis NOT DETECTED NOT DETECTED Final   Cryptococcus neoformans/gattii NOT DETECTED NOT DETECTED Final   CTX-M ESBL NOT DETECTED NOT DETECTED Final   Carbapenem resistance IMP NOT DETECTED NOT DETECTED Final   Carbapenem resistance KPC NOT DETECTED NOT DETECTED Final   Carbapenem resistance NDM NOT DETECTED NOT DETECTED Final   Carbapenem resist OXA 48 LIKE NOT DETECTED NOT DETECTED Final   Carbapenem resistance VIM NOT DETECTED NOT DETECTED Final    Comment: Performed at [facility], [address] [place], [place] [address]  CULTURE, BLOOD (ROUTINE X 2) w Reflex to ID Panel     Status: None (Preliminary result)   Collection Time:  05/31/21 11:08 AM   Specimen: BLOOD  Result Value Ref Range Status   Specimen Description BLOOD  [facility]  Final   Special Requests BOTTLES DRAWN AEROBIC ONLY  BCAV  Final   Culture   Final    NO GROWTH < 24 HOURS Performed at [facility], [address] [place], [place] [address]    Report Status PENDING  Incomplete  CULTURE, BLOOD (ROUTINE X 2) w Reflex to ID Panel     Status: None (Preliminary result)   Collection Time: 05/31/21  3:34 PM   Specimen: BLOOD  Result Value Ref Range Status   Specimen Description BLOOD BLOOD RIGHT HAND  Final   Special Requests   Final    BOTTLES DRAWN AEROBIC AND ANAEROBIC Blood Culture adequate volume   Culture   Final    NO GROWTH < 24 HOURS Performed at [facility], [address] [place], [place] [address]    Report Status PENDING  Incomplete     Labs: CBC: Recent Labs  Lab 05/26/21 2040 05/27/21 0957 05/28/21 0529 05/29/21 0703 05/31/21 1057 06/01/21 0553  WBC 13.1* 20.4* 16.4* 7.6 7.2 7.1  NEUTROABS 8.7*  --   --   --   --   --   HGB 13.2 11.6* 12.5 12.2 11.8* 11.6*  HCT 38.3 33.2* 37.1 35.1* 32.9* 33.5*  MCV 110.1* 107.1* 106.0* 103.5* 104.4* 103.1*  PLT 210 161 177 189 178 185   Basic Metabolic Panel: Recent Labs  Lab 05/26/21 2040 05/27/21 0957 05/28/21 0529 05/31/21 1057 06/01/21 0553 06/01/21 1005  NA 136 136 141 144 142  --   K 5.0 4.3 4.0 2.8* 4.2 4.1  CL 99 103 109 109 108  --   CO2 '25 25 26 24 25  ' --   GLUCOSE 145* 94 86 80 116*  --   BUN '20 16 13 10 ' 6*  --   CREATININE 1.06* 0.81 0.72 0.58 0.56  --   CALCIUM 9.2 8.6* 8.3* 7.7* 7.7*  --   MG  --   --   --  1.9 1.9  --   PHOS  --   --   --  3.5 2.1*  --    Liver Function Tests: Recent Labs  Lab 05/26/21 2040  AST 39  ALT 18  ALKPHOS 87  BILITOT 0.7  PROT 6.4*  ALBUMIN 2.6*   No results for input(s): LIPASE, AMYLASE in the last 168 hours. Recent Labs  Lab 05/26/21 2040  AMMONIA 18   Cardiac Enzymes: No results for input(s):  CKTOTAL, CKMB, CKMBINDEX, TROPONINI in the last 168 hours. BNP (last 3 results) No results for input(s): BNP in the last 8760 hours. CBG: No results for input(s): GLUCAP in the last 168 hours.  Time spent: 35 minutes  Signed:  Val Riles  [facility]  06/01/2021 2:41 PM

## 2021-06-01 NOTE — ED Notes (Signed)
Spoke to [employer], Mrs Shela Nevin, [phone] and provided update on patient status.

## 2021-06-01 NOTE — TOC Transition Note (Signed)
Transition of Care [facility]) - CM/SW Discharge Note   Patient Details  Name: DORALYN KIRKES MRN: [id number] Date of Birth: 1956/04/11  Transition of Care [facility]) CM/SW Contact:  Allayne Butcher, RN Phone Number: 06/01/2021, 3:07 PM   Clinical Narrative:    Patient medically cleared for discharge back to [facility].  Group home has visited with her today and they say she is back to her baseline.  Melissa with [facility] is aware of discharge today.  FL2 printed and will return with the patient in the DC packet.  The home will provide transportation for patient.     Final next level of care: Group Home Barriers to Discharge: Barriers Resolved   Patient Goals and CMS Choice Patient states their goals for this hospitalization and ongoing recovery are:: From [facility], patient will return at discharge CMS Medicare.gov Compare Post Acute Care list provided to:: Patient Represenative (must comment) Choice offered to / list presented to : [facility] POA / Guardian  Discharge Placement              Patient chooses bed at: Other - please specify in the comment section below: ([facility]) Patient to be transferred to facility by: The facility will transport Name of family member notified: attempted to notify [name] (sister) message left Patient and family notified of of transfer: 06/01/21  Discharge Plan and Services   Discharge Planning Services: CM Consult            DME Arranged: N/A DME Agency: NA       HH Arranged: NA          Social Determinants of Health (SDOH) Interventions     Readmission Risk Interventions No flowsheet data found.

## 2021-06-01 NOTE — NC FL2 (Signed)
[place] MEDICAID FL2 LEVEL OF CARE SCREENING TOOL     IDENTIFICATION  Patient Name: Sabrina Johnston Birthdate: 1956-02-12 Sex: female Admission Date (Current Location): 05/26/2021  [facility] and [place] Number:  Chiropodist and Address:  [facility], [address] [place], [place] [address]      Provider Number: [id number]  Attending Physician Name and Address:  Gillis Santa, MD  Relative Name and Phone Number:       Current Level of Care: Domiciliary (Rest home) [facility]) Recommended Level of Care: [facility], Other (Comment) (Group home) Prior Approval Number:    Date Approved/Denied:   PASRR Number:    Discharge Plan: Other (Comment) (Group home)    Current Diagnoses: Patient Active Problem List   Diagnosis Date Noted   Sepsis due to Escherichia coli with encephalopathy (HCC)    Seizure disorder (HCC)    Acute metabolic encephalopathy    Dehydration 05/27/2021   Altered mental status    Hypotension    Loose stools 10/25/2016   Menopause 10/25/2016   Chronic constipation 12/10/2014   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   Severe mental retardation 12/10/2014    Orientation RESPIRATION BLADDER Height & Weight     Self  Normal Incontinent Weight: 66.7 kg Height:  5' (152.4 cm)  BEHAVIORAL SYMPTOMS/MOOD NEUROLOGICAL BOWEL NUTRITION STATUS  Other (Comment) (self injurous behavior)   Incontinent Diet  AMBULATORY STATUS COMMUNICATION OF NEEDS Skin   Supervision Non-Verbally Normal                       Personal Care Assistance Level of Assistance  Bathing, Feeding, Dressing Bathing Assistance: Maximum assistance Feeding assistance: Limited assistance Dressing Assistance: Maximum assistance     Functional Limitations Info             SPECIAL CARE FACTORS FREQUENCY                       Contractures Contractures Info: Not present    Additional Factors Info  Code Status,  Allergies Code Status Info: Full Allergies Info: NKA           Current Medications (06/01/2021):  This is the current hospital active medication list Current Facility-Administered Medications  Medication Dose Route Frequency Provider Last Rate Last Admin   0.9 %  sodium chloride infusion   Intravenous Continuous Mansy, Jan A, MD 100 mL/hr at 06/01/21 0904 New Bag at 06/01/21 0904   acetaminophen (TYLENOL) tablet 650 mg  650 mg Oral Q6H PRN Mansy, Jan A, MD       Or   acetaminophen (TYLENOL) suppository 650 mg  650 mg Rectal Q6H PRN Mansy, Jan A, MD       alum & mag hydroxide-simeth (MAALOX/MYLANTA) 200-200-20 MG/5ML suspension 30 mL  30 mL Oral TID AC & HS Mansy, Jan A, MD   30 mL at 06/01/21 1022   bisacodyl (DULCOLAX) suppository 10 mg  10 mg Rectal PRN Mansy, Jan A, MD       calcium citrate-vitamin D 500-500 MG-UNIT per chewable tablet   Oral BID Mansy, Vernetta Honey, MD   Given at 05/30/21 0845   cephALEXin (KEFLEX) capsule 500 mg  500 mg Oral Q6H Gillis Santa, MD       cholecalciferol (VITAMIN D) tablet 2,000 Units  2,000 Units Oral Daily Mansy, Jan A, MD   2,000 Units at 06/01/21 1025   divalproex (DEPAKOTE) DR tablet 500  mg  500 mg Oral Q12H Jefferson Fuel, MD       DULoxetine (CYMBALTA) DR capsule 90 mg  90 mg Oral Daily Mansy, Jan A, MD   90 mg at 06/01/21 1024   enoxaparin (LOVENOX) injection 40 mg  40 mg Subcutaneous Q24H Mansy, Jan A, MD   40 mg at 06/01/21 1024   guaifenesin (ROBITUSSIN) 100 MG/5ML syrup 200 mg  200 mg Oral TID PRN Mansy, Jan A, MD       hydrALAZINE (APRESOLINE) injection 10 mg  10 mg Intravenous Q6H PRN Arnetha Courser, MD       hydrOXYzine (ATARAX/VISTARIL) tablet 25 mg  25 mg Oral TID PRN Mansy, Jan A, MD       loperamide (IMODIUM) capsule 2 mg  2 mg Oral PRN Mansy, Jan A, MD       loratadine (CLARITIN) tablet 10 mg  10 mg Oral Daily Mansy, Jan A, MD   10 mg at 06/01/21 1025   magnesium hydroxide (MILK OF MAGNESIA) suspension 30 mL  30 mL Oral Daily PRN Mansy, Jan  A, MD       ondansetron [facility]) tablet 4 mg  4 mg Oral Q6H PRN Mansy, Jan A, MD       Or   ondansetron [facility]) injection 4 mg  4 mg Intravenous Q6H PRN Mansy, Jan A, MD       phosphorus (K PHOS NEUTRAL) tablet 500 mg  500 mg Oral TID Gillis Santa, MD   500 mg at 06/01/21 1031   polymixin-bacitracin (POLYSPORIN) ointment   Topical BID Mansy, Jan A, MD   Given at 05/31/21 2229   senna (SENOKOT) tablet 17.2 mg  2 tablet Oral BID Mansy, Jan A, MD   17.2 mg at 06/01/21 1025   traZODone (DESYREL) tablet 25 mg  25 mg Oral QHS PRN Mansy, Vernetta Honey, MD         Discharge Medications: Medication List       TAKE these medications     acetaminophen 325 MG tablet Commonly known as: TYLENOL Take 650 mg by mouth every 6 (six) hours as needed.    aluminum-magnesium hydroxide-simethicone 200-200-20 MG/5ML Susp Commonly known as: MAALOX Take 30 mLs by mouth 4 (four) times daily -  before meals and at bedtime.    bacitracin-polymyxin b ointment Commonly known as: POLYSPORIN Apply topically 2 (two) times daily.    bisacodyl 10 MG suppository Commonly known as: DULCOLAX Place 10 mg rectally as needed for moderate constipation.    Calcium Citrate-Vitamin D 200-250 MG-UNIT Tabs Take 1 tablet by mouth 2 (two) times daily.    carbamide peroxide 6.5 % OTIC solution Commonly known as: DEBROX 5 drops 2 (two) times daily.    cephALEXin 500 MG capsule Commonly known as: KEFLEX Take 1 capsule (500 mg total) by mouth 4 (four) times daily for 7 days.    CERTAGEN SILVER PO Take 1 tablet by mouth daily.    cetirizine 10 MG tablet Commonly known as: ZYRTEC Take 10 mg by mouth daily.    Cholecalciferol 50 MCG (2000 UT) Caps Take 1 capsule by mouth daily.    divalproex 500 MG DR tablet Commonly known as: DEPAKOTE Take 500 mg by mouth 2 (two) times daily.    DULoxetine 30 MG capsule Commonly known as: CYMBALTA Take 30 mg by mouth daily.    DULoxetine 60 MG capsule Commonly known as:  CYMBALTA Take 60 mg by mouth daily.    furosemide 20 MG tablet Commonly  known as: LASIX Take 20 mg by mouth daily.    guaifenesin 100 MG/5ML syrup Commonly known as: ROBITUSSIN Take 200 mg by mouth 3 (three) times daily as needed for cough.    hydrOXYzine 25 MG tablet Commonly known as: ATARAX/VISTARIL Take 25 mg by mouth 3 (three) times daily as needed.    lanolin-mineral oil Lotn Apply topically as needed.    loperamide 2 MG capsule Commonly known as: IMODIUM Take by mouth as needed for diarrhea or loose stools.    magnesium hydroxide 800 MG/5ML suspension Commonly known as: MILK OF MAGNESIA Take by mouth daily as needed for constipation.    senna 8.6 MG tablet Commonly known as: SENOKOT TAKE 2 TABLETS BY MOUTH TWO TIMES A DAY.    tolnaftate 1 % powder Commonly known as: TINACTIN Apply 1 application topically 2 (two) times daily.    Relevant Imaging Results:  Relevant Lab Results:   Additional Information    Allayne Butcher, RN

## 2021-06-01 NOTE — ED Triage Notes (Signed)
Pt with fall at group home, pt agitated and trying to get of stretcher upon arrival, pt placed in recliner, pt getting up and walking in lobby. Pt taken to triage and continues to get out of chair in spite of verbal redirection and reassurance. Pt hitting self and crying when staff attempt VS. EDP notified. Pt to be moved to room with sitter.

## 2021-06-01 NOTE — ED Triage Notes (Signed)
Pt comes into the ED via [facility] from her group home c/o 3 witnessed falls.  Pt was discharged from here 4 hours ago from the admission floor for sepsis and UTI.  Pt has mental retardation and was uncooperative for EMS.  Pt has bruise under eye, but no other injuries noted.   138/82 Unable to obtain any other vitals.

## 2021-06-01 NOTE — ED Provider Notes (Addendum)
Physical Exam  BP (!) 152/94   Pulse 70   Temp 98.2 F (36.8 C) (Axillary)   Resp 14   Ht 5' (1.524 m)   Wt 61.2 kg   SpO2 98%   BMI 26.37 kg/m   Physical Exam  ED Course/Procedures   Clinical Course as of 06/02/21 0719  Thu Jun 01, 2021  2158 Patient has been admitted for urosepsis and bacteremia and was discharged this morning.  Patient had a change in mental status at her group home becoming exceptionally aggressive.  During her stay at the group home for roughly 4 hours patient became aggressive and was attempting to leave bed and chair.  Patient has sustained 3 fall striking her head which was witnessed.  No reported loss of consciousness.  There was no seizure-like activity.  Patient is nonverbal at baseline and has grunting noises but no comprehensible words. [JC]  2158 Patient arrived afebrile, no tachycardia.  She was unable to provide any review of systems.  Initial labs revealed a lactic of 5.6.  White blood cell count is 9.6.  In the setting of recent infection of both urosepsis and bacteremia I will treat with Rocephin which both her urine and bacteremia was susceptible to as well as provide aggressive fluids, however lactic also may be secondary to seizure as the patient does have a history of seizures.  There was no reported seizure-like activity which is why we will continue antibiotic and fluid management for possible sepsis.  Blood cultures pending at this time.  Urinalysis has been unable to be collected up to this point. [JC]    Clinical Course User Index [JC] Cuthriell, Delorise Royals, PA-C    Procedures  MDM  11:30 PM  Assumed care of patient at shift change.  Briefly, patient has history of mental retardation and is nonverbal and lives in a group home.  Recently admitted to the hospital for urosepsis and bacteremia and was discharged this morning.  They report today she had episode of change in her mental status and was extremely aggressive and had 3 falls all  resulting in a head injury.  Here she has been found to have an elevated lactate that is improving.  Has received IV fluids and antibiotics.  Needs CT imaging of her head, face and cervical spine but may require sedation.  Will need admission.  3:30 AM  Pt was able to receive her CT imaging after further sedation.  Repeat lactic now back to normal.  Urine does not appear infected currently.  Blood cultures pending.  It appears that she previously grew E. coli in her blood and urine that was sensitive to cephalosporins.  She received a gram of Rocephin earlier.  We will give another gram now.  Patient now found to be hypothermic.  Will place under a Lawyer.  We will also recheck her blood glucose.  3:45 AM  Pt's CT show small frontal scalp hematoma but no other acute abnormality.  Blood glucose normal.  Will discuss with hospitalist for admission.  4:31 AM Discussed patient's case with hospitalist, Dr. Para March.  I have recommended admission and patient (and family if present) agree with this plan. Admitting physician will place admission orders.   I reviewed all nursing notes, vitals, pertinent previous records and reviewed/interpreted all EKGs, lab and urine results, imaging (as available).    CRITICAL CARE Performed by: Rochele Raring   Total critical care time: 45 minutes  Critical care time was exclusive of separately billable procedures  and treating other patients.  Critical care was necessary to treat or prevent imminent or life-threatening deterioration.  Critical care was time spent personally by me on the following activities: development of treatment plan with patient and/or surrogate as well as nursing, discussions with consultants, evaluation of patient's response to treatment, examination of patient, obtaining history from patient or surrogate, ordering and performing treatments and interventions, ordering and review of laboratory studies, ordering and review of  radiographic studies, pulse oximetry and re-evaluation of patient's condition.        Derica Leiber, Layla Maw, DO 06/02/21 0431    Corinne Goucher, Layla Maw, DO 06/02/21 7092

## 2021-06-01 NOTE — Care Management Important Message (Signed)
Important Message  Patient Details  Name: RUSSIA SCHEIDERER MRN: [id number] Date of Birth: 16-Sep-1956   Medicare Important Message Given:  Other (see comment)  I called the patient's legal guardian, Mardi Mainland [phone], sister and left a message asking for her to call me to review the Important Message from Medicare. Will await a return call.   Olegario Messier A Keaghan Staton 06/01/2021, 3:01 PM

## 2021-06-01 NOTE — ED Notes (Signed)
Patient is resting comfortably. Patient placed in hospital bed. Patient calm and cooperative at this time.

## 2021-06-01 NOTE — ED Provider Notes (Signed)
[facility] Emergency Department Provider Note  ____________________________________________  Time seen: Approximately 9:50 PM  I have reviewed the triage vital signs and the nursing notes.   HISTORY  Chief Complaint Fall  Level 5 caveat: The patient is nonverbal and does not contribute to history.  History is primarily obtained from previous medical charts and EMS  HPI Sabrina Johnston is a 65 y.o. female who presents the emergency department for acute mental status change.  Patient was discharged this morning after inpatient stay for what appeared to be urosepsis and bacteremia.  Patient has been doing well clinically, been discharged with antibiotics at her group home.  Upon arrival at her group home patient became very combative, was fighting with staff there.  This is atypical for the patient.  Reportedly patient fell striking her head and face at least 3 times over the 4 hours after she was discharged.  EMS was called and brought the patient back to the ED.  Patient has been nonverbal/noncommunicative which appears to be the patient's baseline.  She is very agitated and was unable to be adequately assessed in triage due to her agitation.  Patient has been swinging her arms at staff members as well as hitting herself in the head and the face.  There was no reported syncope prior to or after her encounters at the group home with falls.  There was no reported seizure-like activity.  Patient does have a history of mental retardation, seizure disorder, chronic constipation.  Again she was discharged this morning after an inpatient stay for urosepsis and bacteremia.  No reported fevers prior to arrival.       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 Authorizing Provider  acetaminophen (TYLENOL) 325 MG tablet Take 650 mg by mouth every 6 (six) hours as needed.    [provider]  aluminum-magnesium hydroxide-simethicone (MAALOX) 200-200-20 MG/5ML SUSP Take 30 mLs by mouth 4 (four) times daily -  before meals and at bedtime.    [provider]  bacitracin-polymyxin b (POLYSPORIN) ointment Apply topically 2 (two) times daily.    [provider]  bisacodyl (DULCOLAX) 10 MG suppository Place 10 mg rectally as needed for moderate constipation.    [provider]  Calcium Citrate-Vitamin D 200-250 MG-UNIT TABS Take 1 tablet by mouth 2 (two) times daily. 02/01/15   [provider]  carbamide peroxide (DEBROX) 6.5 % otic solution 5 drops 2 (two) times daily.    [provider]  cephALEXin (KEFLEX) 500 MG capsule Take 1 capsule (500 mg total) by mouth 4 (four) times daily for 7 days. 06/01/21 06/08/21  Gillis SantaKumar, Dileep, MD  cetirizine (ZYRTEC) 10 MG tablet Take 10 mg by mouth daily.    [provider]  Cholecalciferol 50 MCG (2000 UT) CAPS Take 1 capsule by mouth daily. 12/29/20   [provider]  divalproex (DEPAKOTE) 500 MG DR tablet Take 500 mg by mouth 2 (two) times daily. 05/02/21   [provider]  DULoxetine (CYMBALTA) 30 MG capsule Take 30 mg by mouth daily.    [provider]  DULoxetine (CYMBALTA) 60 MG capsule Take 60 mg by mouth daily. 05/02/21   [provider]  furosemide (LASIX) 20 MG tablet Take 20  mg by mouth daily.    [provider]  guaifenesin (ROBITUSSIN) 100 MG/5ML syrup Take 200 mg by mouth 3 (three) times daily as needed for cough.    [provider]  hydrOXYzine (ATARAX/VISTARIL) 25 MG tablet Take 25 mg by mouth 3 (three) times daily as needed.    [provider]  lanolin-mineral oil Lorina Rabon) LOTN Apply topically as needed.    [provider]  loperamide (IMODIUM) 2 MG capsule Take by mouth as needed for diarrhea or loose stools.    [provider]  magnesium hydroxide (MILK OF MAGNESIA) 800 MG/5ML suspension Take by mouth daily as needed for constipation.    [provider]  Multiple Vitamins-Minerals (CERTAGEN SILVER PO) Take 1 tablet by mouth daily.    [provider]  senna (SENOKOT) 8.6 MG tablet TAKE 2 TABLETS BY MOUTH TWO TIMES A DAY. 09/26/16   [provider]  tolnaftate (TINACTIN) 1 % powder Apply 1 application topically 2 (two) times daily.    [provider]    Allergies Patient has no known allergies.  Family History  Family history unknown: Yes    Social History Social History   Tobacco Use   Smoking status: Never   Smokeless tobacco: Never  Vaping Use   Vaping Use: Never used  Substance Use Topics   Alcohol use: No   Drug use: No     Review of Systems  Patient unable to provide review of systems.  Review of systems severely limited to history obtained from EMS.  Constitutional: No reported fever/chills.  3 witnessed fall striking her head or face.  No reported loss of consciousness. Neurological: Sharp change from baseline.  Patient has been combative  10 System ROS otherwise negative.  ____________________________________________   PHYSICAL EXAM:  VITAL SIGNS: ED Triage Vitals  Enc Vitals Group     BP 06/01/21 2018 123/64     Pulse Rate 06/01/21 2016 92     Resp 06/01/21 2016 17     Temp 06/01/21 2017 98.2 F (36.8 C)     Temp Source 06/01/21 2017  Axillary     SpO2 06/01/21 2016 98 %     Weight 06/01/21 1856 135 lb (61.2 kg)     Height 06/01/21 1856 5' (1.524 m)     Head Circumference --      Peak Flow --      Pain Score --      Pain Loc --      Pain Edu? --      Excl. in GC? --      Constitutional: Alert.  no acute distress. Eyes: Conjunctivae are normal. PERRL. EOMI. Head: There is ecchymosis about the left eye.  Reportedly this was present at patient's last encounter.  Staff would witnessed the patient prior denies any new visible signs of trauma to the head or face. ENT:      Ears:       Nose: No congestion/rhinnorhea.  Mouth/Throat: Mucous membranes are moist.  Neck: No stridor.  Patient is moving neck freely.  Patient does not appear to be tender to palpation along the cervical spine.  No palpable abnormalities. Cardiovascular: Normal rate, regular rhythm. Normal S1 and S2.  Good peripheral circulation. Respiratory: Normal respiratory effort without tachypnea or retractions. Lungs CTAB. Good air entry to the bases with no decreased or absent breath sounds. Gastrointestinal: Bowel sounds 4 quadrants. Soft and nontender to palpation. No apparent guarding or rigidity. No palpable masses. No distention. Musculoskeletal: Full range of motion to all extremities. No gross deformities appreciated.  Patient freely moving legs and arms at this time.  Patient is reaching out attempting to climb out of bed, attempting to swing at myself and other staff members during assessment.  No obvious deformities.  No palpable abnormalities Neurologic: Patient nonverbal at baseline.  Grunting noises but no comprehensible words.  No gross focal neurologic deficits are appreciated.  Skin:  Skin is warm, dry and intact. No rash noted. Psychiatric: Mood and affect are aggressive.  Change from baseline according to staff you have previously seen the patient as well as group home staff where the patient resides.Marland Kitchen Speech is at the patient's baseline.   Nonverbal at baseline, grunting sounds with no comprehensible words.   ____________________________________________   LABS (all labs ordered are listed, but only abnormal results are displayed)  Labs Reviewed  COMPREHENSIVE METABOLIC PANEL - Abnormal; Notable for the following components:      Result Value   BUN <5 (*)    Calcium 8.2 (*)    Total Protein 5.2 (*)    Albumin 2.0 (*)    AST 74 (*)    Alkaline Phosphatase 142 (*)    All other components within normal limits  LACTIC ACID, PLASMA - Abnormal; Notable for the following components:   Lactic Acid, Venous 3.2 (*)    All other components within normal limits  LACTIC ACID, PLASMA - Abnormal; Notable for the following components:   Lactic Acid, Venous 5.6 (*)    All other components within normal limits  CBC WITH DIFFERENTIAL/PLATELET - Abnormal; Notable for the following components:   RBC 3.11 (*)    Hemoglobin 11.5 (*)    HCT 32.5 (*)    MCV 104.5 (*)    MCH 37.0 (*)    nRBC 0.8 (*)    Monocytes Absolute 1.8 (*)    Abs Immature Granulocytes 0.43 (*)    All other components within normal limits  AMMONIA - Abnormal; Notable for the following components:   Ammonia 44 (*)    All other components within normal limits  URINE CULTURE  CULTURE, BLOOD (ROUTINE X 2)  CULTURE, BLOOD (ROUTINE X 2)  PROTIME-INR  URINALYSIS, COMPLETE (UACMP) WITH MICROSCOPIC  LACTIC ACID, PLASMA  LACTIC ACID, PLASMA   ____________________________________________  EKG  ED ECG REPORT I, Delorise Royals Zylie Mumaw,  personally viewed and interpreted this ECG.   Date: 06/01/2021  EKG Time: 2224 hrs.  Rate: 95 bpm  Rhythm: normal EKG, normal sinus rhythm, there are no previous tracings available for comparison  Axis: Normal axis  Intervals:none  ST&T Change: No ST elevation or depression noted.  Nonspecific ST changes in V3  No STEMI.  Normal sinus rhythm.  No previous EKG for  comparison.  ____________________________________________  RADIOLOGY I personally viewed and evaluated these images as part of my medical decision making, as well as reviewing the written report by the radiologist.  ED Provider Interpretation: Imaging not completed at  this time  No results found.  ____________________________________________    PROCEDURES  Procedure(s) performed:    Procedures    Medications  sodium chloride 0.9 % bolus 1,000 mL (1,000 mLs Intravenous New Bag/Given 06/01/21 2020)  LORazepam (ATIVAN) injection 2 mg (2 mg Intravenous Given 06/01/21 2015)  droperidol (INAPSINE) 2.5 MG/ML injection 5 mg (5 mg Intravenous Given 06/01/21 2209)  sodium chloride 0.9 % bolus 1,000 mL (1,000 mLs Intravenous New Bag/Given 06/01/21 2238)  cefTRIAXone (ROCEPHIN) 1 g in sodium chloride 0.9 % 100 mL IVPB (1 g Intravenous New Bag/Given 06/01/21 2241)     ____________________________________________   INITIAL IMPRESSION / ASSESSMENT AND PLAN / ED COURSE  Pertinent labs & imaging results that were available during my care of the patient were reviewed by me and considered in my medical decision making (see chart for details).  Review of the [place] CSRS was performed in accordance of the [facility] prior to dispensing any controlled drugs.  Clinical Course as of 06/01/21 2337  Thu Jun 01, 2021  2158 Patient has been admitted for urosepsis and bacteremia and was discharged this morning.  Patient had a change in mental status at her group home becoming exceptionally aggressive.  During her stay at the group home for roughly 4 hours patient became aggressive and was attempting to leave bed and chair.  Patient has sustained 3 fall striking her head which was witnessed.  No reported loss of consciousness.  There was no seizure-like activity.  Patient is nonverbal at baseline and has grunting noises but no comprehensible words. [JC]  2158 Patient arrived afebrile, no tachycardia.  She was unable  to provide any review of systems.  Initial labs revealed a lactic of 5.6.  White blood cell count is 9.6.  In the setting of recent infection of both urosepsis and bacteremia I will treat with Rocephin which both her urine and bacteremia was susceptible to as well as provide aggressive fluids, however lactic also may be secondary to seizure as the patient does have a history of seizures.  There was no reported seizure-like activity which is why we will continue antibiotic and fluid management for possible sepsis.  Blood cultures pending at this time.  Urinalysis has been unable to be collected up to this point. [JC]    Clinical Course User Index [JC] Aaliyan Brinkmeier, Delorise Royals, PA-C        Patient presents the emergency department from group home for sudden change in behavior.  Patient had been admitted for urosepsis and bacteremia.  Patient had been reassuring at time of discharge with reassuring vitals.  Patient had been discharged with antibiotics.  On return to group home patient had a sudden change in her behavior becoming very combative.  Patient has been climbing out of the chair and bed at her group home and had fallen striking her head 3 times that was witnessed.  Given the sudden change EMS was called and patient presents to the ED.  Patient does have MR, is unable to contribute to the history.  Patient was very combative and could not be assessed in triage and was immediately brought to a room.  Patient has not been able to contribute to her history.  Initially there appeared to be trauma to the face with ecchymosis about the left eye.  However staff member who has seen the patient at her time of admission for her previous admit states that this was present then.  As such, there does not appear to be a significant  trauma to the head or face at this time.  We have been awaiting imaging results to include x-ray and CT as patient has been to agitated to perform both imaging modalities.  Patient did have  some improvement with Ativan but still remains combative with any attempt at intervention to include labs or medications.  At this time patient has had droperidol to see if this would also calm down patient enough to proceed with imaging.  Initial lactic was 5.6, trended to 3.2.  No elevation in the white blood cell count.  Urinalysis without gross signs of infection and chest x-ray pending.  Given the sudden change in the elevated lactic, differential still includes infection to include bacteremia, urosepsis, pneumonia or other source of infection but there is no evidence of skin infection.  Patient will also have imaging to ensure no trauma is sustained from her falling and hitting her head.  We will also evaluate to ensure no evidence of acute intracranial abnormality precipitating the sudden change in behavior.  At this time patient will be transferred to the major side of the emergency department and Dr. Elesa Massed will take over the patient's care.  I discussed the patient's history, physical exam and work-up to this point.  Patient is still unable to tolerate imaging after droperidol patient will likely require sedation to be able to tolerate imaging.  Final diagnosis and disposition will be provided by attending provider, Dr. Elesa Massed.    This chart was dictated using voice recognition software/Dragon. Despite best efforts to proofread, errors can occur which can change the meaning. Any change was purely unintentional.    Racheal Patches, PA-C 06/01/21 2337    Ward, Layla Maw, DO 06/02/21 [phone]

## 2021-06-02 ENCOUNTER — Emergency Department: Payer: Medicare Other

## 2021-06-02 ENCOUNTER — Encounter: Payer: Self-pay | Admitting: Internal Medicine

## 2021-06-02 DIAGNOSIS — G9341 Metabolic encephalopathy: Secondary | ICD-10-CM

## 2021-06-02 DIAGNOSIS — R451 Restlessness and agitation: Secondary | ICD-10-CM | POA: Diagnosis present

## 2021-06-02 LAB — URINALYSIS, COMPLETE (UACMP) WITH MICROSCOPIC
Bacteria, UA: NONE SEEN
Bilirubin Urine: NEGATIVE
Glucose, UA: NEGATIVE mg/dL
Hgb urine dipstick: NEGATIVE
Ketones, ur: 5 mg/dL — AB
Leukocytes,Ua: NEGATIVE
Nitrite: NEGATIVE
Protein, ur: NEGATIVE mg/dL
Specific Gravity, Urine: 1.006 (ref 1.005–1.030)
pH: 7 (ref 5.0–8.0)

## 2021-06-02 LAB — RESP PANEL BY RT-PCR (FLU A&B, COVID) ARPGX2
Influenza A by PCR: NEGATIVE
Influenza B by PCR: NEGATIVE
SARS Coronavirus 2 by RT PCR: NEGATIVE

## 2021-06-02 LAB — ETHANOL: Alcohol, Ethyl (B): <10 mg/dL (ref <10)

## 2021-06-02 LAB — URINE DRUG SCREEN, QUALITATIVE (ARMC ONLY)
Amphetamines, Ur Screen: NOT DETECTED
Barbiturates, Ur Screen: NOT DETECTED
Benzodiazepine, Ur Scrn: POSITIVE — AB
Cannabinoid 50 Ng, Ur ~~LOC~~: NOT DETECTED
Cocaine Metabolite,Ur ~~LOC~~: NOT DETECTED
MDMA (Ecstasy)Ur Screen: NOT DETECTED
Methadone Scn, Ur: NOT DETECTED
Opiate, Ur Screen: NOT DETECTED
Phencyclidine (PCP) Ur S: NOT DETECTED
Tricyclic, Ur Screen: NOT DETECTED

## 2021-06-02 LAB — LACTIC ACID, PLASMA
Lactic Acid, Venous: 1.7 mmol/L (ref 0.5–1.9)
Lactic Acid, Venous: 0.9 mmol/L (ref 0.5–1.9)

## 2021-06-02 LAB — TSH: TSH: 9.096 u[IU]/mL — ABNORMAL HIGH (ref 0.350–4.500)

## 2021-06-02 LAB — CBG MONITORING, ED
Glucose-Capillary: 91 mg/dL (ref 70–99)
Glucose-Capillary: 89 mg/dL (ref 70–99)

## 2021-06-02 LAB — CK: Total CK: 252 U/L — ABNORMAL HIGH (ref 38–234)

## 2021-06-02 LAB — TROPONIN I (HIGH SENSITIVITY): Troponin I (High Sensitivity): 10 ng/L (ref <18)

## 2021-06-02 LAB — GLUCOSE, CAPILLARY: Glucose-Capillary: 82 mg/dL (ref 70–99)

## 2021-06-02 MED ORDER — ONDANSETRON HCL 4 MG PO TABS: 4.0000 mg | ORAL_TABLET | Freq: Four times a day (QID) | ORAL | Status: DC | PRN

## 2021-06-02 MED ORDER — HALOPERIDOL LACTATE 5 MG/ML IJ SOLN
2.0000 mg | Freq: Four times a day (QID) | INTRAMUSCULAR | Status: DC | PRN
  Administered 2021-06-02 – 2021-06-03 (×2): 2 mg via INTRAMUSCULAR
  Filled 2021-06-02 (×2): qty 1

## 2021-06-02 MED ORDER — LACTATED RINGERS IV SOLN: INTRAVENOUS | Status: DC

## 2021-06-02 MED ORDER — ENOXAPARIN SODIUM 40 MG/0.4ML IJ SOSY
40.0000 mg | PREFILLED_SYRINGE | INTRAMUSCULAR | Status: DC
  Administered 2021-06-02 – 2021-06-03 (×2): 40 mg via SUBCUTANEOUS
  Filled 2021-06-02 (×2): qty 0.4

## 2021-06-02 MED ORDER — MIDAZOLAM HCL 5 MG/5ML IJ SOLN
INTRAMUSCULAR | Status: AC
  Administered 2021-06-02: 2 mg via INTRAVENOUS
  Filled 2021-06-02: qty 5

## 2021-06-02 MED ORDER — VALPROATE SODIUM 100 MG/ML IV SOLN
250.0000 mg | Freq: Four times a day (QID) | INTRAVENOUS | Status: DC
  Administered 2021-06-02 – 2021-06-04 (×7): 250 mg via INTRAVENOUS
  Filled 2021-06-02 (×15): qty 2.5

## 2021-06-02 MED ORDER — SODIUM CHLORIDE 0.9 % IV SOLN
1.0000 g | Freq: Once | INTRAVENOUS | Status: AC
  Administered 2021-06-02: 1 g via INTRAVENOUS
  Filled 2021-06-02: qty 10

## 2021-06-02 MED ORDER — ACETAMINOPHEN 325 MG PO TABS
650.0000 mg | ORAL_TABLET | Freq: Four times a day (QID) | ORAL | Status: DC | PRN
  Administered 2021-06-17 – 2021-07-31 (×29): 650 mg via ORAL
  Filled 2021-06-02 (×32): qty 2

## 2021-06-02 MED ORDER — ONDANSETRON HCL 4 MG/2ML IJ SOLN: 4.0000 mg | Freq: Four times a day (QID) | INTRAMUSCULAR | Status: DC | PRN

## 2021-06-02 MED ORDER — KETAMINE HCL 10 MG/ML IJ SOLN
INTRAMUSCULAR | Status: AC
  Filled 2021-06-02: qty 1

## 2021-06-02 MED ORDER — SODIUM CHLORIDE 0.9 % IV SOLN
2.0000 g | INTRAVENOUS | Status: DC
  Administered 2021-06-02 – 2021-06-03 (×2): 2 g via INTRAVENOUS
  Filled 2021-06-02: qty 2
  Filled 2021-06-02 (×2): qty 20

## 2021-06-02 MED ORDER — LEVOTHYROXINE SODIUM 100 MCG/5ML IV SOLN
25.0000 ug | Freq: Every day | INTRAVENOUS | Status: DC
  Administered 2021-06-02 – 2021-06-03 (×2): 25 ug via INTRAVENOUS
  Filled 2021-06-02 (×3): qty 5

## 2021-06-02 MED ORDER — ACETAMINOPHEN 650 MG RE SUPP
650.0000 mg | Freq: Four times a day (QID) | RECTAL | Status: DC | PRN
  Administered 2021-06-02: 650 mg via RECTAL
  Filled 2021-06-02: qty 1

## 2021-06-02 MED ORDER — MIDAZOLAM HCL 2 MG/2ML IJ SOLN: 2.0000 mg | Freq: Once | INTRAMUSCULAR | Status: AC

## 2021-06-02 NOTE — ED Notes (Signed)
Fall mats placed at bedside for patient safety. Non-skid socks applied, call light within reach. Bed alarm on. Patient resting on her side, NADN.

## 2021-06-02 NOTE — H&P (Signed)
History and Physical    Sabrina Johnston SJG:[id number] DOB: 07/24/1956 DOA: 06/01/2021  PCP: Lauro Regulus, MD   Patient coming from: Group home  I have personally briefly reviewed patient's old medical records in [facility]  Chief Complaint: Agitation Patient unable to provide any history as she is sedated.  She is also nonverbal  HPI: Sabrina Johnston is a 65 y.o. female with medical history significant for cognitive and neurobehavioral dysfunction, seizure disorder and chronic constipation, who presented to the emergency room 3 hours after her discharge for evaluation of multiple falls and agitation.   She was hospitalized for 6 days and treated for E. coli sepsis with IV antibiotics. 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 She was seen in consultation by neurology and had an EEG which did not show any seizure activity even the patient required 3 mg of IV Ativan for agitation prior to getting the EEG done. Patient was sent back to the emergency room for evaluation from the group home because of aggressive behavior and agitation.  She has sustained 3 falls and struck her head but there was no loss of consciousness or seizure-like activity. Labs in the emergency room showed a lactic acid of 5.6 which has since normalized. I am unable to do a review of systems on this patient since she is sedated and nonverbal. Labs show sodium 143, potassium 4.0, chloride 110, bicarb 23, glucose 83, BUN less than 5, creatinine 0.79, calcium 8.2, alkaline phosphatase 142, albumin 2.0, AST 74, ALT 32, total protein 5.2, ammonia 44, total CK2 52, lactic acid 5. >> 0.9, white count 9.6, hemoglobin 11.5, hematocrit 32.5, MCV 104.5, RDW 14.7, platelet count 236, PT 13.9, INR 1.1, TSH 9.09 Respiratory  viral panel is negative Chest x-ray reviewed by me shows low lung volumes with no active disease. CT scan of the head without contrast/maxillofacial CT/CT scan of cervical spine shows markedly enlarged lateral ventricles with colpocephaly configuration, likely congenital.  Small frontal scalp hematoma without skull fracture.  No facial fracture.  No acute fracture or static subluxation of the cervical spine. Twelve-lead EKG reviewed by me shows normal sinus rhythm with nonspecific ST changes.    ED Course: Patient is a 65 year old female with a history of cognitive and neurobehavioral dysfunction who resides in a group home and was discharged from the hospital on 06/01/21 after hospitalization for E. coli sepsis and metabolic encephalopathy. Patient returned to the emergency room 3 hours after her discharge for evaluation of multiple witnessed falls as well as aggressive behavior towards the staff. Imaging shows a right frontal hematoma and her initial lactic acid level was elevated at 5.6. She required sedation to get a CT scan of her head, cervical spine and maxillofacial CT to rule out any injuries and remains sedated. She was on a Lawyer transiently for hypothermia which has resolved. She will be referred to observation status for further evaluation.    Review of Systems: As per HPI otherwise all other systems reviewed and negative.    Past Medical History:  Diagnosis Date   Menopause    Mental retardation    PMB (postmenopausal bleeding)     Past Surgical History:  Procedure Laterality Date   HYSTEROSCOPY WITH D & C  2013   b9 endometrium wiht polyps  reports that she has never smoked. She has never used smokeless tobacco. She reports that she does not drink alcohol and does not use drugs.  No Known Allergies  Family History  Family history unknown: Yes  Patient unable to provide history   Prior to Admission medications   Medication Sig Start Date End Date  Taking? Authorizing Provider  acetaminophen (TYLENOL) 325 MG tablet Take 650 mg by mouth every 6 (six) hours as needed.   Yes [provider]  aluminum-magnesium hydroxide-simethicone (MAALOX) 200-200-20 MG/5ML SUSP Take 30 mLs by mouth 4 (four) times daily -  before meals and at bedtime.   Yes [provider]  bacitracin-polymyxin b (POLYSPORIN) ointment Apply topically 2 (two) times daily.   Yes [provider]  bisacodyl (DULCOLAX) 10 MG suppository Place 10 mg rectally as needed for moderate constipation.   Yes [provider]  Calcium Citrate-Vitamin D 200-250 MG-UNIT TABS Take 1 tablet by mouth 2 (two) times daily. 02/01/15  Yes [provider]  carbamide peroxide (DEBROX) 6.5 % otic solution 5 drops 2 (two) times daily.   Yes [provider]  cephALEXin (KEFLEX) 500 MG capsule Take 1 capsule (500 mg total) by mouth 4 (four) times daily for 7 days. 06/01/21 06/08/21 Yes Gillis Santa, MD  cetirizine (ZYRTEC) 10 MG tablet Take 10 mg by mouth daily.   Yes [provider]  Cholecalciferol 50 MCG (2000 UT) CAPS Take 1 capsule by mouth daily. 12/29/20  Yes [provider]  divalproex (DEPAKOTE) 500 MG DR tablet Take 500 mg by mouth 2 (two) times daily. 05/02/21  Yes [provider]  DULoxetine (CYMBALTA) 30 MG capsule Take 30 mg by mouth daily.   Yes [provider]  DULoxetine (CYMBALTA) 60 MG capsule Take 60 mg by mouth daily. 05/02/21  Yes [provider]  furosemide (LASIX) 20 MG tablet Take 20 mg by mouth daily.   Yes [provider]  guaifenesin (ROBITUSSIN) 100 MG/5ML syrup Take 200 mg by mouth 3 (three) times daily as needed for cough.   Yes [provider]  hydrOXYzine (ATARAX/VISTARIL) 25 MG tablet Take 25 mg by mouth 3 (three) times daily as needed.   Yes [provider]  lanolin-mineral oil Lorina Rabon) LOTN Apply topically as needed.   Yes [provider]   loperamide (IMODIUM) 2 MG capsule Take by mouth as needed for diarrhea or loose stools.   Yes [provider]  magnesium hydroxide (MILK OF MAGNESIA) 800 MG/5ML suspension Take by mouth daily as needed for constipation.   Yes [provider]  Multiple Vitamins-Minerals (CERTAGEN SILVER PO) Take 1 tablet by mouth daily.   Yes [provider]  senna (SENOKOT) 8.6 MG tablet TAKE 2 TABLETS BY MOUTH TWO TIMES A DAY. 09/26/16  Yes [provider]  tolnaftate (TINACTIN) 1 % powder Apply 1 application topically 2 (two) times daily.   Yes [provider]    Physical Exam: Vitals:   06/02/21 0345 06/02/21 0500 06/02/21 0718 06/02/21 0900  BP:    134/80  Pulse: 86 90  90  Resp: 17 17  16   Temp:   98.2 F (36.8 C)   TempSrc:   Axillary   SpO2: 98% 97%  97%  Weight:      Height:         Vitals:   06/02/21 0345 06/02/21 0500 06/02/21 0718 06/02/21 0900  BP:    134/80  Pulse: 86 90  90  Resp: 17 17  16  Temp:   98.2 F (36.8 C)   TempSrc:   Axillary   SpO2: 98% 97%  97%  Weight:      Height:          Constitutional: Sedated.  Moving around in bed.  Not in any apparent distress HEENT:      Head: Normocephalic and atraumatic.  Frontal hematoma      Eyes: PERLA, EOMI, Conjunctivae are normal. Sclera is non-icteric.       Mouth/Throat: Mucous membranes are moist.       Neck: Supple with no signs of meningismus. Cardiovascular: Regular rate and rhythm. No murmurs, gallops, or rubs. 2+ symmetrical distal pulses are present . No JVD. No LE edema Respiratory: Respiratory effort normal .Lungs sounds clear bilaterally. No wheezes, crackles, or rhonchi.  Gastrointestinal: Soft, non tender, and non distended with positive bowel sounds.  Genitourinary: No CVA tenderness. Musculoskeletal: Nontender with normal range of motion in all extremities. No cyanosis, or erythema of extremities. Neurologic:  Face is symmetric.  Unable to assess Skin: Skin is  warm, dry.  No rash or ulcers Psychiatric: Unable to assess   Labs on Admission: I have personally reviewed following labs and imaging studies  CBC: Recent Labs  Lab 05/26/21 2040 05/27/21 0957 05/28/21 0529 05/29/21 0703 05/31/21 1057 06/01/21 0553 06/01/21 1950  WBC 13.1*   < > 16.4* 7.6 7.2 7.1 9.6  NEUTROABS 8.7*  --   --   --   --   --  4.3  HGB 13.2   < > 12.5 12.2 11.8* 11.6* 11.5*  HCT 38.3   < > 37.1 35.1* 32.9* 33.5* 32.5*  MCV 110.1*   < > 106.0* 103.5* 104.4* 103.1* 104.5*  PLT 210   < > 177 189 178 177 236   < > = values in this interval not displayed.   Basic Metabolic Panel: Recent Labs  Lab 05/27/21 0957 05/28/21 0529 05/31/21 1057 06/01/21 0553 06/01/21 1005 06/01/21 1950  NA 136 141 144 142  --  143  K 4.3 4.0 2.8* 4.2 4.1 4.0  CL 103 109 109 108  --  110  CO2 25 26 24 25   --  23  GLUCOSE 94 86 80 116*  --  83  BUN 16 13 10  6*  --  <5*  CREATININE 0.81 0.72 0.58 0.56  --  0.79  CALCIUM 8.6* 8.3* 7.7* 7.7*  --  8.2*  MG  --   --  1.9 1.9  --   --   PHOS  --   --  3.5 2.1*  --   --    GFR: Estimated Creatinine Clearance: 58.9 mL/min (by C-G formula based on SCr of 0.79 mg/dL). Liver Function Tests: Recent Labs  Lab 05/26/21 2040 06/01/21 1950  AST 39 74*  ALT 18 32  ALKPHOS 87 142*  BILITOT 0.7 0.6  PROT 6.4* 5.2*  ALBUMIN 2.6* 2.0*   No results for input(s): LIPASE, AMYLASE in the last 168 hours. Recent Labs  Lab 05/26/21 2040 06/01/21 1950  AMMONIA 18 44*   Coagulation Profile: Recent Labs  Lab 05/27/21 0127 06/01/21 1950  INR 1.1 1.1   Cardiac Enzymes: Recent Labs  Lab 06/01/21 1950  CKTOTAL 252*   BNP (last 3 results) No results for input(s): PROBNP in the last 8760 hours. HbA1C: No results for input(s): HGBA1C in the last 72 hours. CBG: Recent Labs  Lab 06/02/21 0330  GLUCAP 91   Lipid Profile: No results for  input(s): CHOL, HDL, LDLCALC, TRIG, CHOLHDL, LDLDIRECT in the last 72 hours. Thyroid Function  Tests: Recent Labs    05/31/21 1057 06/01/21 0553 06/01/21 1950  TSH 10.274*  --  9.096*  FREET4 1.21* 1.18*  --   T3FREE 2.1  --   --    Anemia Panel: Recent Labs    05/31/21 1057  VITAMINB12 1,846*  FOLATE 29.0   Urine analysis:    Component Value Date/Time   COLORURINE YELLOW (A) 06/02/2021 0018   APPEARANCEUR HAZY (A) 06/02/2021 0018   APPEARANCEUR Cloudy (A) 10/26/2016 1137   LABSPEC 1.006 06/02/2021 0018   PHURINE 7.0 06/02/2021 0018   GLUCOSEU NEGATIVE 06/02/2021 0018   HGBUR NEGATIVE 06/02/2021 0018   BILIRUBINUR NEGATIVE 06/02/2021 0018   BILIRUBINUR 1+ 11/01/2017 1630   BILIRUBINUR Negative 10/26/2016 1137   KETONESUR 5 (A) 06/02/2021 0018   PROTEINUR NEGATIVE 06/02/2021 0018   UROBILINOGEN 0.2 11/01/2017 1630   NITRITE NEGATIVE 06/02/2021 0018   LEUKOCYTESUR NEGATIVE 06/02/2021 0018    Radiological Exams on Admission: DG Chest 1 View  Result Date: 06/01/2021 CLINICAL DATA:  Three witnessed falls. discharged from here 4 hours ago from the admission floor for sepsis and UTI. EXAM: CHEST  1 VIEW COMPARISON:  Chest x-ray 05/26/2021 FINDINGS: The heart and mediastinal contours are within normal limits. Low lung volumes. No focal consolidation. No pulmonary edema. No pleural effusion. No pneumothorax. No acute osseous abnormality. IMPRESSION: Low lung volumes with no active disease. Electronically Signed   By: Tish Frederickson M.D.   On: 06/01/2021 23:37   CT HEAD WO CONTRAST ( )  Result Date: 06/02/2021 CLINICAL DATA:  Multiple falls.  Altered mental status. EXAM: CT HEAD WITHOUT CONTRAST CT MAXILLOFACIAL WITHOUT CONTRAST CT CERVICAL SPINE WITHOUT CONTRAST TECHNIQUE: Multidetector CT imaging of the head, cervical spine, and maxillofacial structures were performed using the standard protocol without intravenous contrast. Multiplanar CT image reconstructions of the cervical spine and maxillofacial structures were also generated. COMPARISON:  None. FINDINGS: CT HEAD  FINDINGS Brain: There is no mass, hemorrhage or extra-axial collection. 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 No acute hemorrhage. The brain parenchyma is normal, without evidence of acute or chronic infarction. Vascular: No abnormal hyperdensity of the major intracranial arteries or dural venous sinuses. No intracranial atherosclerosis. Skull: Small frontal scalp hematoma.  No skull fracture. CT MAXILLOFACIAL FINDINGS Osseous: --Complex facial fracture types: No LeFort, zygomaticomaxillary complex or nasoorbitoethmoidal fracture. --Simple fracture types: None. --Mandible: No fracture or dislocation. Orbits: The globes are intact. Normal appearance of the intra- and extraconal fat. Symmetric extraocular muscles and optic nerves. Sinuses: No fluid levels or advanced mucosal thickening. Soft tissues: Normal visualized extracranial soft tissues. CT CERVICAL SPINE FINDINGS Alignment: No static subluxation. Facets are aligned. Occipital condyles and the lateral masses of C1-C2 are aligned. Skull base and vertebrae: No acute fracture. Soft tissues and spinal canal: No prevertebral fluid or swelling. No visible canal hematoma. Disc levels: No advanced spinal canal or neural foraminal stenosis. Upper chest: No pneumothorax, pulmonary nodule or pleural effusion. Other: Normal visualized paraspinal cervical soft tissues. IMPRESSION: 1. 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 2. Small frontal scalp hematoma without skull fracture. 3. No facial fracture. 4. No acute fracture or static subluxation of the cervical spine. Electronically Signed   By: Deatra Robinson M.D.   On: 06/02/2021 03:18   CT Cervical Spine Wo Contrast  Result Date: 06/02/2021 CLINICAL DATA:  Multiple falls.  Altered mental status. EXAM: CT  HEAD WITHOUT CONTRAST CT MAXILLOFACIAL WITHOUT CONTRAST CT CERVICAL SPINE WITHOUT CONTRAST TECHNIQUE:  Multidetector CT imaging of the head, cervical spine, and maxillofacial structures were performed using the standard protocol without intravenous contrast. Multiplanar CT image reconstructions of the cervical spine and maxillofacial structures were also generated. COMPARISON:  None. FINDINGS: CT HEAD FINDINGS Brain: There is no mass, hemorrhage or extra-axial collection. 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 No acute hemorrhage. The brain parenchyma is normal, without evidence of acute or chronic infarction. Vascular: No abnormal hyperdensity of the major intracranial arteries or dural venous sinuses. No intracranial atherosclerosis. Skull: Small frontal scalp hematoma.  No skull fracture. CT MAXILLOFACIAL FINDINGS Osseous: --Complex facial fracture types: No LeFort, zygomaticomaxillary complex or nasoorbitoethmoidal fracture. --Simple fracture types: None. --Mandible: No fracture or dislocation. Orbits: The globes are intact. Normal appearance of the intra- and extraconal fat. Symmetric extraocular muscles and optic nerves. Sinuses: No fluid levels or advanced mucosal thickening. Soft tissues: Normal visualized extracranial soft tissues. CT CERVICAL SPINE FINDINGS Alignment: No static subluxation. Facets are aligned. Occipital condyles and the lateral masses of C1-C2 are aligned. Skull base and vertebrae: No acute fracture. Soft tissues and spinal canal: No prevertebral fluid or swelling. No visible canal hematoma. Disc levels: No advanced spinal canal or neural foraminal stenosis. Upper chest: No pneumothorax, pulmonary nodule or pleural effusion. Other: Normal visualized paraspinal cervical soft tissues. IMPRESSION: 1. 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 2. Small frontal scalp hematoma without skull fracture. 3. No facial fracture. 4. No acute fracture or static subluxation of the cervical  spine. Electronically Signed   By: Deatra RobinsonKevin  Herman M.D.   On: 06/02/2021 03:18   CT Maxillofacial Wo Contrast  Result Date: 06/02/2021 CLINICAL DATA:  Multiple falls.  Altered mental status. EXAM: CT HEAD WITHOUT CONTRAST CT MAXILLOFACIAL WITHOUT CONTRAST CT CERVICAL SPINE WITHOUT CONTRAST TECHNIQUE: Multidetector CT imaging of the head, cervical spine, and maxillofacial structures were performed using the standard protocol without intravenous contrast. Multiplanar CT image reconstructions of the cervical spine and maxillofacial structures were also generated. COMPARISON:  None. FINDINGS: CT HEAD FINDINGS Brain: There is no mass, hemorrhage or extra-axial collection. 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 No acute hemorrhage. The brain parenchyma is normal, without evidence of acute or chronic infarction. Vascular: No abnormal hyperdensity of the major intracranial arteries or dural venous sinuses. No intracranial atherosclerosis. Skull: Small frontal scalp hematoma.  No skull fracture. CT MAXILLOFACIAL FINDINGS Osseous: --Complex facial fracture types: No LeFort, zygomaticomaxillary complex or nasoorbitoethmoidal fracture. --Simple fracture types: None. --Mandible: No fracture or dislocation. Orbits: The globes are intact. Normal appearance of the intra- and extraconal fat. Symmetric extraocular muscles and optic nerves. Sinuses: No fluid levels or advanced mucosal thickening. Soft tissues: Normal visualized extracranial soft tissues. CT CERVICAL SPINE FINDINGS Alignment: No static subluxation. Facets are aligned. Occipital condyles and the lateral masses of C1-C2 are aligned. Skull base and vertebrae: No acute fracture. Soft tissues and spinal canal: No prevertebral fluid or swelling. No visible canal hematoma. Disc levels: No advanced spinal canal or neural foraminal stenosis. Upper chest: No pneumothorax, pulmonary nodule or pleural effusion.  Other: Normal visualized paraspinal cervical soft tissues. IMPRESSION: 1. 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 2. Small frontal scalp hematoma without skull fracture. 3. No facial fracture. 4. No acute fracture or static subluxation of the cervical spine. Electronically Signed   By: Deatra RobinsonKevin  Herman M.D.   On:  06/02/2021 03:18     Assessment/Plan Principal Problem:   Acute metabolic encephalopathy Active Problems:   Generalized epilepsy (HCC)   Severe intellectual disability   Sepsis due to Escherichia coli (E. coli) (HCC)   Agitation      Acute metabolic encephalopathy Patient presents to the ER for evaluation of agitation and aggressive behavior Recently hospitalized for E. coli sepsis and required neurology consult for encephalopathy She had an EEG which did not show any evidence of seizures Continue supportive care Strict aspiration and seizure precautions     E. coli bacteremia Blood cultures from 05/27/21 yielded E. Coli Will place patient on Rocephin 2 g IV daily    Epilepsy Place patient on IV Depakote Seizure precautions    Hypothyroidism Place patient on IV levothyroxine and switch to oral once mental status improves      Frequent Falls Fall precautions Will request PT evaluation once mental status improves  DVT prophylaxis: Lovenox  Code Status: full code  Family Communication: Greater than 50% of time was spent discussing patient's condition and plan of care with her sister Mardi Mainland over the phone.  All questions and concerns have been addressed.  She verbalizes understanding and agrees to the plan.  CODE STATUS was discussed and she is a full code Disposition Plan: Back to previous home environment Consults called: Physical therapy Status: Observation    Olman Yono MD [facility]     06/02/2021, 9:52 AM

## 2021-06-02 NOTE — ED Notes (Signed)
Patient transported to CT 

## 2021-06-02 NOTE — ED Notes (Signed)
Pt became agitated, in room crying and hitting self in head. PRN tylenol given for pain and provider notified. Blood sugar checked and found to be 89.

## 2021-06-02 NOTE — ED Notes (Signed)
2mg  Versed, as ordered by Dr. was administered via IV for sedation during CT scan by this RN. Remaining 3mg  wasted via SteriCycle by this RN, witnessed by Elesa Massed, RN. Ketamine returned to Pyxis by Raquel D, Charge RN, as Versed was sufficient for successful CT. Dr. notified.

## 2021-06-02 NOTE — ED Notes (Signed)
Informed RN bed assigned 

## 2021-06-02 NOTE — ED Notes (Signed)
Consent received via telephone for in and out catheter for specimen collection as ordered. Consent obtained from patient's POA, Mrs. Shela Nevin. Lauren, patient registration witness to telephone consent.

## 2021-06-03 DIAGNOSIS — F72 Severe intellectual disabilities: Secondary | ICD-10-CM | POA: Diagnosis present

## 2021-06-03 DIAGNOSIS — G4709 Other insomnia: Secondary | ICD-10-CM | POA: Diagnosis not present

## 2021-06-03 DIAGNOSIS — R451 Restlessness and agitation: Secondary | ICD-10-CM | POA: Diagnosis present

## 2021-06-03 DIAGNOSIS — N179 Acute kidney failure, unspecified: Secondary | ICD-10-CM | POA: Diagnosis not present

## 2021-06-03 DIAGNOSIS — R7989 Other specified abnormal findings of blood chemistry: Secondary | ICD-10-CM

## 2021-06-03 DIAGNOSIS — Z20822 Contact with and (suspected) exposure to covid-19: Secondary | ICD-10-CM | POA: Diagnosis present

## 2021-06-03 DIAGNOSIS — W19XXXA Unspecified fall, initial encounter: Secondary | ICD-10-CM | POA: Diagnosis present

## 2021-06-03 DIAGNOSIS — G4700 Insomnia, unspecified: Secondary | ICD-10-CM | POA: Diagnosis present

## 2021-06-03 DIAGNOSIS — E860 Dehydration: Secondary | ICD-10-CM | POA: Diagnosis present

## 2021-06-03 DIAGNOSIS — R7881 Bacteremia: Secondary | ICD-10-CM | POA: Diagnosis not present

## 2021-06-03 DIAGNOSIS — F05 Delirium due to known physiological condition: Secondary | ICD-10-CM | POA: Diagnosis not present

## 2021-06-03 DIAGNOSIS — G9341 Metabolic encephalopathy: Secondary | ICD-10-CM | POA: Diagnosis present

## 2021-06-03 DIAGNOSIS — G9389 Other specified disorders of brain: Secondary | ICD-10-CM | POA: Diagnosis present

## 2021-06-03 DIAGNOSIS — G40309 Generalized idiopathic epilepsy and epileptic syndromes, not intractable, without status epilepticus: Secondary | ICD-10-CM | POA: Diagnosis not present

## 2021-06-03 DIAGNOSIS — I959 Hypotension, unspecified: Secondary | ICD-10-CM | POA: Diagnosis not present

## 2021-06-03 DIAGNOSIS — D75839 Thrombocytosis, unspecified: Secondary | ICD-10-CM | POA: Diagnosis present

## 2021-06-03 DIAGNOSIS — E039 Hypothyroidism, unspecified: Secondary | ICD-10-CM | POA: Diagnosis present

## 2021-06-03 DIAGNOSIS — D539 Nutritional anemia, unspecified: Secondary | ICD-10-CM | POA: Diagnosis present

## 2021-06-03 DIAGNOSIS — M6282 Rhabdomyolysis: Secondary | ICD-10-CM | POA: Diagnosis present

## 2021-06-03 DIAGNOSIS — Z7189 Other specified counseling: Secondary | ICD-10-CM | POA: Diagnosis not present

## 2021-06-03 DIAGNOSIS — S0003XA Contusion of scalp, initial encounter: Secondary | ICD-10-CM | POA: Diagnosis present

## 2021-06-03 DIAGNOSIS — G40409 Other generalized epilepsy and epileptic syndromes, not intractable, without status epilepticus: Secondary | ICD-10-CM | POA: Diagnosis present

## 2021-06-03 DIAGNOSIS — Z9181 History of falling: Secondary | ICD-10-CM | POA: Diagnosis not present

## 2021-06-03 DIAGNOSIS — E875 Hyperkalemia: Secondary | ICD-10-CM | POA: Diagnosis present

## 2021-06-03 DIAGNOSIS — L89892 Pressure ulcer of other site, stage 2: Secondary | ICD-10-CM | POA: Diagnosis not present

## 2021-06-03 DIAGNOSIS — N390 Urinary tract infection, site not specified: Secondary | ICD-10-CM | POA: Diagnosis present

## 2021-06-03 DIAGNOSIS — K5909 Other constipation: Secondary | ICD-10-CM | POA: Diagnosis present

## 2021-06-03 DIAGNOSIS — Q048 Other specified congenital malformations of brain: Secondary | ICD-10-CM | POA: Diagnosis not present

## 2021-06-03 DIAGNOSIS — W19XXXD Unspecified fall, subsequent encounter: Secondary | ICD-10-CM | POA: Diagnosis not present

## 2021-06-03 DIAGNOSIS — A4151 Sepsis due to Escherichia coli [E. coli]: Secondary | ICD-10-CM | POA: Diagnosis present

## 2021-06-03 DIAGNOSIS — R5383 Other fatigue: Secondary | ICD-10-CM | POA: Diagnosis not present

## 2021-06-03 DIAGNOSIS — L259 Unspecified contact dermatitis, unspecified cause: Secondary | ICD-10-CM | POA: Diagnosis present

## 2021-06-03 DIAGNOSIS — G40909 Epilepsy, unspecified, not intractable, without status epilepticus: Secondary | ICD-10-CM | POA: Diagnosis not present

## 2021-06-03 DIAGNOSIS — Z66 Do not resuscitate: Secondary | ICD-10-CM | POA: Diagnosis not present

## 2021-06-03 LAB — CBC
HCT: 32.8 % — ABNORMAL LOW (ref 36.0–46.0)
Hemoglobin: 11.7 g/dL — ABNORMAL LOW (ref 12.0–15.0)
MCH: 37.4 pg — ABNORMAL HIGH (ref 26.0–34.0)
MCHC: 35.7 g/dL (ref 30.0–36.0)
MCV: 104.8 fL — ABNORMAL HIGH (ref 80.0–100.0)
Platelets: 249 10*3/uL (ref 150–400)
RBC: 3.13 MIL/uL — ABNORMAL LOW (ref 3.87–5.11)
RDW: 15.2 % (ref 11.5–15.5)
WBC: 10.8 10*3/uL — ABNORMAL HIGH (ref 4.0–10.5)
nRBC: 0.7 % — ABNORMAL HIGH (ref 0.0–0.2)

## 2021-06-03 LAB — BASIC METABOLIC PANEL
Anion gap: 11 (ref 5–15)
BUN: <5 mg/dL — ABNORMAL LOW (ref 8–23)
CO2: 25 mmol/L (ref 22–32)
Calcium: 8.5 mg/dL — ABNORMAL LOW (ref 8.9–10.3)
Chloride: 106 mmol/L (ref 98–111)
Creatinine, Ser: 0.62 mg/dL (ref 0.44–1.00)
GFR, Estimated: >60 mL/min (ref >60)
Glucose, Bld: 88 mg/dL (ref 70–99)
Potassium: 3.6 mmol/L (ref 3.5–5.1)
Sodium: 142 mmol/L (ref 135–145)

## 2021-06-03 LAB — T4, FREE: Free T4: 1.5 ng/dL — ABNORMAL HIGH (ref 0.61–1.12)

## 2021-06-03 LAB — URINE CULTURE: Culture: NO GROWTH

## 2021-06-03 LAB — TSH: TSH: 6.792 u[IU]/mL — ABNORMAL HIGH (ref 0.350–4.500)

## 2021-06-03 LAB — GLUCOSE, CAPILLARY: Glucose-Capillary: 93 mg/dL (ref 70–99)

## 2021-06-03 LAB — VALPROIC ACID LEVEL: Valproic Acid Lvl: 55 ug/mL (ref 50.0–100.0)

## 2021-06-03 NOTE — Assessment & Plan Note (Addendum)
-   Valproic acid normal - Continue Depakote

## 2021-06-03 NOTE — Hospital Course (Addendum)
Sabrina Johnston is a 65 y.o. female with medical history significant for cognitive and neurobehavioral dysfunction, seizure disorder and chronic constipation, who presented to the emergency room 3 hours after her discharge for evaluation of multiple falls and agitation.   She was hospitalized for 6 days and treated for E. coli sepsis with IV antibiotics. 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 She was seen in consultation by neurology and had an EEG which did not show any seizure activity even the patient required 3 mg of IV Ativan for agitation prior to getting the EEG done. Patient was sent back to the emergency room for evaluation from the group home because of aggressive behavior and agitation.  She has sustained 3 falls and struck her head but there was no loss of consciousness or seizure-like activity. Labs in the emergency room showed a lactic acid of 5.6 which has since normalized. I am unable to do a review of systems on this patient since she is sedated and nonverbal. Labs show sodium 143, potassium 4.0, chloride 110, bicarb 23, glucose 83, BUN less than 5, creatinine 0.79, calcium 8.2, alkaline phosphatase 142, albumin 2.0, AST 74, ALT 32, total protein 5.2, ammonia 44, total CK2 52, lactic acid 5. >> 0.9, white count 9.6, hemoglobin 11.5, hematocrit 32.5, MCV 104.5, RDW 14.7, platelet count 236, PT 13.9, INR 1.1, TSH 9.09 Respiratory viral panel is negative Chest x-ray reviewed by me shows low lung volumes with no active disease. CT scan of the head without contrast/maxillofacial CT/CT scan of cervical spine shows markedly enlarged lateral ventricles with colpocephaly configuration, likely congenital.  Small frontal scalp hematoma without skull fracture.  No facial fracture.  No acute fracture  or static subluxation of the cervical spine. Twelve-lead EKG reviewed by me shows normal sinus rhythm with nonspecific ST changes.   ED Course: Patient is a 65 year old female with a history of cognitive and neurobehavioral dysfunction who resides in a group home and was discharged from the hospital on 06/01/21 after hospitalization for E. coli sepsis and metabolic encephalopathy. Patient returned to the emergency room 3 hours after her discharge for evaluation of multiple witnessed falls as well as aggressive behavior towards the staff. Imaging shows a right frontal hematoma and her initial lactic acid level was elevated at 5.6. She required sedation to get a CT scan of her head, cervical spine and maxillofacial CT to rule out any injuries and remains sedated. She was on a Lawyer transiently for hypothermia which has resolved.

## 2021-06-03 NOTE — Progress Notes (Signed)
Pt's caregiver, "Joyce Gross" here giving me an update. She said normally pt walks indendently at home, no walker. She doesn't follow commands but rather, "does her own thing". Joyce Gross states she really has never had a fall before this last episode.

## 2021-06-03 NOTE — Progress Notes (Signed)
PT Cancellation Note  Patient Details Name: Sabrina Johnston MRN: [id number] DOB: 1956/07/26   Cancelled Treatment:    Reason Eval/Treat Not Completed: Other (comment) PT orders received, chart reviewed. Pt received lying in bed, asleep, with NT obtaining vitals. PT & NT attempt to reposition pt in bed with pt only moaning 1x but otherwise does not respond to movement. Pt not alert/appropriate for PT evaluation at this time. Will f/u as able.  [name], PT, DPT 06/03/21, 8:38 AM    [name] 06/03/2021, 8:37 AM

## 2021-06-03 NOTE — Assessment & Plan Note (Addendum)
-   Intermittent and may be due to her underlying cognitive disorders.  No acute etiology to explain otherwise at this time -Has been calm and cooperative since 06/04/2021

## 2021-06-03 NOTE — Assessment & Plan Note (Addendum)
-   Patient has significant neurocognitive dysfunction and developmental disorder.  Was able to ambulate prior to hospitalizations but recently now is eating less and not walking anymore; caretaker states this is different for her. I discussed that at her age with her diagnoses it's not unreasonable that she may be starting to decline (as we would see with a typical end stage dementia patient).  - not much further to offer at this time other than supportive care -Group home is asking for evaluation for possible SNF.  Patient may not be rehabable at this point but we will attempt. Otherwise this may be her new baseline and as I mentioned prior, her underlying neurocog impairments may simply be progressing

## 2021-06-03 NOTE — TOC Progression Note (Signed)
Transition of Care [facility]) - Progression Note    Patient Details  Name: Sabrina Johnston MRN: [id number] Date of Birth: [birth year]  Transition of Care [facility]) CM/SW Contact  Caryn Section, RN Phone Number: 06/03/2021, 1:26 PM  Clinical Narrative:   Spoke with patient's sister, [name], who is patient's legal guardian.  Patient resides at [email or web address] [place] [address] [phone].  Patient resides on [address] near [place] (she does not have the exact address) in a group home there, and sister states that she anticipates patient's return to this facility on discharge.  She does not have any concerns about patient residing there at this time.  TOC contact information given to sister, TOC to follow to discharge.      Expected Discharge Plan and Services                                                 Social Determinants of Health (SDOH) Interventions    Readmission Risk Interventions No flowsheet data found.

## 2021-06-03 NOTE — Progress Notes (Addendum)
Progress Note    [name]   FGH:[id number]  DOB: 1956/01/31  DOA: 06/01/2021     0  PCP: Sabrina Regulus, MD  Initial CC: agitation   Hospital Course: Sabrina Johnston is a 65 y.o. female with medical history significant for cognitive and neurobehavioral dysfunction, seizure disorder and chronic constipation, who presented to the emergency room 3 hours after her discharge for evaluation of multiple falls and agitation.   She was hospitalized for 6 days and treated for E. coli sepsis with IV antibiotics. 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 She was seen in consultation by neurology and had an EEG which did not show any seizure activity even the patient required 3 mg of IV Ativan for agitation prior to getting the EEG done. Patient was sent back to the emergency room for evaluation from the group home because of aggressive behavior and agitation.  She has sustained 3 falls and struck her head but there was no loss of consciousness or seizure-like activity. Labs in the emergency room showed a lactic acid of 5.6 which has since normalized. I am unable to do a review of systems on this patient since she is sedated and nonverbal. Labs show sodium 143, potassium 4.0, chloride 110, bicarb 23, glucose 83, BUN less than 5, creatinine 0.79, calcium 8.2, alkaline phosphatase 142, albumin 2.0, AST 74, ALT 32, total protein 5.2, ammonia 44, total CK2 52, lactic acid 5. >> 0.9, white count 9.6, hemoglobin 11.5, hematocrit 32.5, MCV 104.5, RDW 14.7, platelet count 236, PT 13.9, INR 1.1, TSH 9.09 Respiratory viral panel is negative Chest x-ray reviewed by me shows low lung volumes with no active disease. CT scan of the head without contrast/maxillofacial CT/CT scan of cervical spine shows  markedly enlarged lateral ventricles with colpocephaly configuration, likely congenital.  Small frontal scalp hematoma without skull fracture.  No facial fracture.  No acute fracture or static subluxation of the cervical spine. Twelve-lead EKG reviewed by me shows normal sinus rhythm with nonspecific ST changes.   ED Course: Patient is a 65 year old female with a history of cognitive and neurobehavioral dysfunction who resides in a group home and was discharged from the hospital on 06/01/21 after hospitalization for E. coli sepsis and metabolic encephalopathy. Patient returned to the emergency room 3 hours after her discharge for evaluation of multiple witnessed falls as well as aggressive behavior towards the staff. Imaging shows a right frontal hematoma and her initial lactic acid level was elevated at 5.6. She required sedation to get a CT scan of her head, cervical spine and maxillofacial CT to rule out any injuries and remains sedated. She was on a Lawyer transiently for hypothermia which has resolved. She will be referred to observation status for further evaluation.  Interval History:  Seen this morning in room with her caretaker bedside.  Patient was asleep and laying on her right side and would not engage or follow commands.  Typically she would get agitated if someone touches her or moves her around but she did not do any of this during my exam with caretaker bedside.  ROS: Review of systems not obtained due to patient factors.  Cognitive impairment  Assessment & Plan: * Acute metabolic encephalopathy - initially had some agitation in the ER on admission; after discussing with her caretaker  bedside on 06/03/2021, she has not far off of baseline.  She is noted to often hit her head with her hands at times and due to being nonverbal has difficulty communicating and gets agitated easily.  We also discussed that her underlying neurocognitive dysfunction may simply be progressive at this  point -Patient was resting comfortably in bed asleep as she has been doing more often lately this morning when caretaker was bedside  Abnormal TSH - H&P mentions hypothyroidism but do not see this on her med list - TSH has been recently elevated - Most recent TSH is 9.096 on 06/01/2021 - Check free T4 and total T3 - will try and clarify further if patient has diagnosis and dosing of her Synthroid  Agitation - Intermittent and may be due to her underlying cognitive disorders.  No acute etiology to explain otherwise at this time  Bacteremia due to Gram-negative bacteria - Discharged on 06/01/2021 and was treated with Rocephin.  Repeat cultures were also negative from 05/31/2021.  She was discharged on 7 more days of Keflex 500 mg 4 times daily -Currently on Rocephin again during this hospitalization and will discharge again on Keflex to complete her course  Severe intellectual disability - Patient has significant neurocognitive dysfunction and developmental disorder.  Was able to ambulate prior to hospitalizations but recently now is eating less and not walking anymore; caretaker states this is different for her. I discussed that at her age with her diagnoses it's not unreasonable that she may be starting to decline (as we would see with a typical end stage dementia patient).  - not much further to offer at this time other than supportive care  Generalized epilepsy (HCC) - Valproic acid normal - Continue valproate   Old records reviewed in assessment of this patient  Antimicrobials: Rocephin 06/02/2021 >> current  DVT prophylaxis: enoxaparin (LOVENOX) injection 40 mg Start: 06/02/21 1000   Code Status:   Code Status: Full Code Family Communication: Caretaker  Disposition Plan: Status is: Inpatient  Remains inpatient appropriate because:Altered mental status, IV treatments appropriate due to intensity of illness or inability to take PO, and Inpatient level of care appropriate due to  severity of illness  Dispo: The patient is from:  [facility]              Anticipated d/c is to:  same              Patient currently is not medically stable to d/c.   Difficult to place patient No      Risk of unplanned readmission score: Unplanned Admission- Pilot do not use: 19.58   Objective: Blood pressure (!) 142/90, pulse 96, temperature (!) 96.9 F (36.1 C), resp. rate 16, height 5' (1.524 m), weight 61.2 kg, SpO2 99 %.  Examination: General appearance:  chronically ill appearing; laying on right side in NAD; does not arouse or engage in exam Head:  asymmetric head Eyes:  pupils equally round Lungs: clear to auscultation bilaterally Heart: regular rate and rhythm and S1, S2 normal Abdomen: normal findings: bowel sounds normal and soft, non-tender Extremities:  right had 1st digit noted with 2 serous filled bullae; contracted LUE Skin: normal Neurologic: unable to fully assess; nonverbal. Does not follow commands  Consultants:    Procedures:    Data Reviewed: I have personally reviewed following labs and imaging studies Results for orders placed or performed during the hospital encounter of 06/01/21 (from the past 24 hour(s))  Glucose, capillary  Status: None   Collection Time: 06/02/21 10:31 PM  Result Value Ref Range   Glucose-Capillary 82 70 - 99 mg/dL  CBC     Status: Abnormal   Collection Time: 06/03/21  6:57 AM  Result Value Ref Range   WBC 10.8 (H) 4.0 - 10.5 K/uL   RBC 3.13 (L) 3.87 - 5.11 MIL/uL   Hemoglobin 11.7 (L) 12.0 - 15.0 g/dL   HCT 16.1 (L) 09.6 - 04.5 %   MCV 104.8 (H) 80.0 - 100.0 fL   MCH 37.4 (H) 26.0 - 34.0 pg   MCHC 35.7 30.0 - 36.0 g/dL   RDW 40.9 81.1 - 91.4 %   Platelets 249 150 - 400 K/uL   nRBC 0.7 (H) 0.0 - 0.2 %  Basic metabolic panel     Status: Abnormal   Collection Time: 06/03/21  6:57 AM  Result Value Ref Range   Sodium 142 135 - 145 mmol/L   Potassium 3.6 3.5 - 5.1 mmol/L   Chloride 106 98 - 111  mmol/L   CO2 25 22 - 32 mmol/L   Glucose, Bld 88 70 - 99 mg/dL   BUN <5 (L) 8 - 23 mg/dL   Creatinine, Ser 7.82 0.44 - 1.00 mg/dL   Calcium 8.5 (L) 8.9 - 10.3 mg/dL   GFR, Estimated >95 >62 mL/min   Anion gap 11 5 - 15  Valproic acid level     Status: None   Collection Time: 06/03/21 10:58 AM  Result Value Ref Range   Valproic Acid Lvl 55 50.0 - 100.0 ug/mL    Recent Results (from the past 240 hour(s))  Resp Panel by RT-PCR (Flu A&B, Covid)     Status: None   Collection Time: 05/26/21  8:40 PM   Specimen: Nasopharyngeal(NP) swabs in vial transport medium  Result Value Ref Range Status   SARS Coronavirus 2 by RT PCR NEGATIVE NEGATIVE Final    Comment: (NOTE) SARS-CoV-2 target nucleic acids are NOT DETECTED.  The SARS-CoV-2 RNA is generally detectable in upper respiratory specimens during the acute phase of infection. The lowest concentration of SARS-CoV-2 viral copies this assay can detect is 138 copies/mL. A negative result does not preclude SARS-Cov-2 infection and should not be used as the sole basis for treatment or other patient management decisions. A negative result may occur with  improper specimen collection/handling, submission of specimen other than nasopharyngeal swab, presence of viral mutation(s) within the areas targeted by this assay, and inadequate number of viral copies(<138 copies/mL). A negative result must be combined with clinical observations, patient history, and epidemiological information. The expected result is Negative.  Fact Sheet for Patients:  BloggerCourse.com  Fact Sheet for Healthcare Providers:  SeriousBroker.it  This test is no t yet approved or cleared by the [place] FDA and  has been authorized for detection and/or diagnosis of SARS-CoV-2 by FDA under an Emergency Use Authorization (EUA). This EUA will remain  in effect (meaning this test can be used) for the duration of  the COVID-19 declaration under Section 564(b)(1) of the Act, 21 U.S.C.section 360bbb-3(b)(1), unless the authorization is terminated  or revoked sooner.       Influenza A by PCR NEGATIVE NEGATIVE Final   Influenza B by PCR NEGATIVE NEGATIVE Final    Comment: (NOTE) The Xpert Xpress SARS-CoV-2/FLU/RSV plus assay is intended as an aid in the diagnosis of influenza from Nasopharyngeal swab specimens and should not be used as a sole basis for treatment. Nasal washings and aspirates are unacceptable  for Xpert Xpress SARS-CoV-2/FLU/RSV testing.  Fact Sheet for Patients: BloggerCourse.com  Fact Sheet for Healthcare Providers: SeriousBroker.it  This test is not yet approved or cleared by the [place] FDA and has been authorized for detection and/or diagnosis of SARS-CoV-2 by FDA under an Emergency Use Authorization (EUA). This EUA will remain in effect (meaning this test can be used) for the duration of the COVID-19 declaration under Section 564(b)(1) of the Act, 21 U.S.C. section 360bbb-3(b)(1), unless the authorization is terminated or revoked.  Performed at [facility], [address] [place], [place] [address]   Urine Culture     Status: Abnormal   Collection Time: 05/26/21  8:40 PM   Specimen: Urine, Clean Catch  Result Value Ref Range Status   Specimen Description   Final    URINE, CLEAN CATCH Performed at [facility], [address] [place], [place] [address]    Special Requests   Final    NONE Performed at [facility], [address] [place], [place] [address]    Culture >=100,000 COLONIES/mL ESCHERICHIA COLI (A)  Final   Report Status 05/29/2021 FINAL  Final   Organism ID, Bacteria ESCHERICHIA COLI (A)  Final      Susceptibility   Escherichia coli - MIC*    AMPICILLIN >=32 RESISTANT Resistant     CEFAZOLIN <=4 SENSITIVE Sensitive     CEFEPIME <=0.12 SENSITIVE Sensitive      CEFTRIAXONE <=0.25 SENSITIVE Sensitive     CIPROFLOXACIN <=0.25 SENSITIVE Sensitive     GENTAMICIN >=16 RESISTANT Resistant     IMIPENEM <=0.25 SENSITIVE Sensitive     NITROFURANTOIN <=16 SENSITIVE Sensitive     TRIMETH/SULFA >=320 RESISTANT Resistant     AMPICILLIN/SULBACTAM >=32 RESISTANT Resistant     PIP/TAZO <=4 SENSITIVE Sensitive     * >=100,000 COLONIES/mL ESCHERICHIA COLI  Blood culture (routine single)     Status: Abnormal   Collection Time: 05/27/21  9:57 AM   Specimen: BLOOD  Result Value Ref Range Status   Specimen Description   Final    BLOOD BLOOD RIGHT ARM Performed at [facility], [address], [place], [place] [address]    Special Requests   Final    BOTTLES DRAWN AEROBIC AND ANAEROBIC Blood Culture adequate volume Performed at [facility], [address] [place], [place] [address]    Culture  Setup Time   Final    GRAM NEGATIVE RODS IN BOTH AEROBIC AND ANAEROBIC BOTTLES CRITICAL RESULT CALLED TO, READ BACK BY AND VERIFIED WITH: NATHAN BELUE @ 2320 05/27/21 LFD Performed at [facility], [address] [place], [place] [address]    Culture ESCHERICHIA COLI (A)  Final   Report Status 05/30/2021 FINAL  Final   Organism ID, Bacteria ESCHERICHIA COLI  Final      Susceptibility   Escherichia coli - MIC*    AMPICILLIN >=32 RESISTANT Resistant     CEFAZOLIN <=4 SENSITIVE Sensitive     CEFEPIME <=0.12 SENSITIVE Sensitive     CEFTAZIDIME <=1 SENSITIVE Sensitive     CEFTRIAXONE <=0.25 SENSITIVE Sensitive     CIPROFLOXACIN <=0.25 SENSITIVE Sensitive     GENTAMICIN >=16 RESISTANT Resistant     IMIPENEM <=0.25 SENSITIVE Sensitive     TRIMETH/SULFA >=320 RESISTANT Resistant     AMPICILLIN/SULBACTAM >=32 RESISTANT Resistant     PIP/TAZO <=4 SENSITIVE Sensitive     * ESCHERICHIA COLI  Blood Culture ID Panel (Reflexed)     Status: Abnormal (Preliminary result)  Collection Time: 05/27/21  9:57 AM  Result Value Ref Range Status    Enterococcus faecalis NOT DETECTED NOT DETECTED Final   Enterococcus Faecium NOT DETECTED NOT DETECTED Final   Listeria monocytogenes NOT DETECTED NOT DETECTED Final   Staphylococcus species NOT DETECTED NOT DETECTED Final   Staphylococcus aureus (BCID) PENDING NOT DETECTED Incomplete   Staphylococcus epidermidis PENDING NOT DETECTED Incomplete   Staphylococcus lugdunensis PENDING NOT DETECTED Incomplete   Streptococcus species NOT DETECTED NOT DETECTED Final   Streptococcus agalactiae NOT DETECTED NOT DETECTED Final   Streptococcus pneumoniae NOT DETECTED NOT DETECTED Final   Streptococcus pyogenes NOT DETECTED NOT DETECTED Final   A.calcoaceticus-baumannii NOT DETECTED NOT DETECTED Final   Bacteroides fragilis NOT DETECTED NOT DETECTED Final   Enterobacterales DETECTED (A) NOT DETECTED Final    Comment: Enterobacterales represent a large order of gram negative bacteria, not a single organism. CRITICAL RESULT CALLED TO, READ BACK BY AND VERIFIED WITH: NATHAN BELUE @ 2320 05/27/21 LFD    Enterobacter cloacae complex NOT DETECTED NOT DETECTED Final   Escherichia coli DETECTED (A) NOT DETECTED Final    Comment: CRITICAL RESULT CALLED TO, READ BACK BY AND VERIFIED WITH: NATHAN BELUE @ 2320 05/27/21 LFD     Klebsiella aerogenes NOT DETECTED NOT DETECTED Final   Klebsiella oxytoca NOT DETECTED NOT DETECTED Final   Klebsiella pneumoniae NOT DETECTED NOT DETECTED Final   Proteus species NOT DETECTED NOT DETECTED Final   Salmonella species NOT DETECTED NOT DETECTED Final   Serratia marcescens NOT DETECTED NOT DETECTED Final   Haemophilus influenzae NOT DETECTED NOT DETECTED Final   Neisseria meningitidis NOT DETECTED NOT DETECTED Final   Pseudomonas aeruginosa NOT DETECTED NOT DETECTED Final   Stenotrophomonas maltophilia NOT DETECTED NOT DETECTED Final   Candida albicans NOT DETECTED NOT DETECTED Final   Candida auris NOT DETECTED NOT DETECTED Final   Candida glabrata NOT DETECTED NOT  DETECTED Final   Candida krusei NOT DETECTED NOT DETECTED Final   Candida parapsilosis NOT DETECTED NOT DETECTED Final   Candida tropicalis NOT DETECTED NOT DETECTED Final   Cryptococcus neoformans/gattii NOT DETECTED NOT DETECTED Final   CTX-M ESBL NOT DETECTED NOT DETECTED Final   Carbapenem resistance IMP NOT DETECTED NOT DETECTED Final   Carbapenem resistance KPC NOT DETECTED NOT DETECTED Final   Carbapenem resistance NDM NOT DETECTED NOT DETECTED Final   Carbapenem resist OXA 48 LIKE NOT DETECTED NOT DETECTED Final   Carbapenem resistance VIM NOT DETECTED NOT DETECTED Final    Comment: Performed at [facility], [address] [place], [place] [address]  CULTURE, BLOOD (ROUTINE X 2) w Reflex to ID Panel     Status: None (Preliminary result)   Collection Time: 05/31/21 11:08 AM   Specimen: BLOOD  Result Value Ref Range Status   Specimen Description BLOOD  BRH  Final   Special Requests BOTTLES DRAWN AEROBIC ONLY  BCAV  Final   Culture   Final    NO GROWTH 3 DAYS Performed at [facility], [address] [place], [place] [address]    Report Status PENDING  Incomplete  CULTURE, BLOOD (ROUTINE X 2) w Reflex to ID Panel     Status: None (Preliminary result)   Collection Time: 05/31/21  3:34 PM   Specimen: BLOOD  Result Value Ref Range Status   Specimen Description BLOOD BLOOD RIGHT HAND  Final   Special Requests   Final    BOTTLES DRAWN AEROBIC AND ANAEROBIC Blood Culture adequate volume   Culture  Final    NO GROWTH 3 DAYS Performed at [facility], [address] [place], [place] [address]    Report Status PENDING  Incomplete  Culture, blood (routine x 2)     Status: None (Preliminary result)   Collection Time: 06/01/21  7:50 PM   Specimen: BLOOD  Result Value Ref Range Status   Specimen Description BLOOD BLOOD RIGHT WRIST  Final   Special Requests   Final    BOTTLES DRAWN AEROBIC AND ANAEROBIC Blood Culture results may not be optimal due to  an inadequate volume of blood received in culture bottles   Culture   Final    NO GROWTH 2 DAYS Performed at [facility], [address], [place], [place] [address]    Report Status PENDING  Incomplete  Culture, blood (routine x 2)     Status: None (Preliminary result)   Collection Time: 06/01/21 10:36 PM   Specimen: BLOOD  Result Value Ref Range Status   Specimen Description BLOOD LEFT HAND  Final   Special Requests   Final    BOTTLES DRAWN AEROBIC AND ANAEROBIC Blood Culture results may not be optimal due to an inadequate volume of blood received in culture bottles   Culture   Final    NO GROWTH 2 DAYS Performed at [facility], [address] [place], [place] [address]    Report Status PENDING  Incomplete  Urine Culture     Status: None   Collection Time: 06/02/21 12:18 AM   Specimen: Urine, Random  Result Value Ref Range Status   Specimen Description   Final    URINE, RANDOM Performed at [facility], [address], [place], [place] [address]    Special Requests   Final    NONE Performed at [facility], [address], [place], [place] [address]    Culture   Final    NO GROWTH Performed at [facility], [address] [place], [place] [address]    Report Status 06/03/2021 FINAL  Final  Resp Panel by RT-PCR (Flu A&B, Covid) Urine, Clean Catch     Status: None   Collection Time: 06/02/21 12:18 AM   Specimen: Urine, Clean Catch; Nasopharyngeal(NP) swabs in vial transport medium  Result Value Ref Range Status   SARS Coronavirus 2 by RT PCR NEGATIVE NEGATIVE Final    Comment: (NOTE) SARS-CoV-2 target nucleic acids are NOT DETECTED.  The SARS-CoV-2 RNA is generally detectable in upper respiratory specimens during the acute phase of infection. The lowest concentration of SARS-CoV-2 viral copies this assay can detect is 138 copies/mL. A negative result does not preclude SARS-Cov-2 infection and should not be used as the  sole basis for treatment or other patient management decisions. A negative result may occur with  improper specimen collection/handling, submission of specimen other than nasopharyngeal swab, presence of viral mutation(s) within the areas targeted by this assay, and inadequate number of viral copies(<138 copies/mL). A negative result must be combined with clinical observations, patient history, and epidemiological information. The expected result is Negative.  Fact Sheet for Patients:  BloggerCourse.com  Fact Sheet for Healthcare Providers:  SeriousBroker.it  This test is no t yet approved or cleared by the [place] FDA and  has been authorized for detection and/or diagnosis of SARS-CoV-2 by FDA under an Emergency Use Authorization (EUA). This EUA will remain  in effect (meaning this test can be used) for the duration of the COVID-19 declaration under Section 564(b)(1) of  the Act, 21 U.S.C.section 360bbb-3(b)(1), unless the authorization is terminated  or revoked sooner.       Influenza A by PCR NEGATIVE NEGATIVE Final   Influenza B by PCR NEGATIVE NEGATIVE Final    Comment: (NOTE) The Xpert Xpress SARS-CoV-2/FLU/RSV plus assay is intended as an aid in the diagnosis of influenza from Nasopharyngeal swab specimens and should not be used as a sole basis for treatment. Nasal washings and aspirates are unacceptable for Xpert Xpress SARS-CoV-2/FLU/RSV testing.  Fact Sheet for Patients: BloggerCourse.comhttps://www.fda.gov/media/152166/download  Fact Sheet for Healthcare Providers: SeriousBroker.ithttps://www.fda.gov/media/152162/download  This test is not yet approved or cleared by the [place] FDA and has been authorized for detection and/or diagnosis of SARS-CoV-2 by FDA under an Emergency Use Authorization (EUA). This EUA will remain in effect (meaning this test can be used) for the duration of the COVID-19 declaration under Section 564(b)(1) of the  Act, 21 U.S.C. section 360bbb-3(b)(1), unless the authorization is terminated or revoked.  Performed at [facility], [address] [place], [place] [id number]      Radiology Studies: DG Chest 1 View  Result Date: 06/01/2021 CLINICAL DATA:  Three witnessed falls. discharged from here 4 hours ago from the admission floor for sepsis and UTI. EXAM: CHEST  1 VIEW COMPARISON:  Chest x-ray 05/26/2021 FINDINGS: The heart and mediastinal contours are within normal limits. Low lung volumes. No focal consolidation. No pulmonary edema. No pleural effusion. No pneumothorax. No acute osseous abnormality. IMPRESSION: Low lung volumes with no active disease. Electronically Signed   By: Tish FredericksonMorgane  Naveau M.D.   On: 06/01/2021 23:37   CT HEAD WO CONTRAST (5MM)  Result Date: 06/02/2021 CLINICAL DATA:  Multiple falls.  Altered mental status. EXAM: CT HEAD WITHOUT CONTRAST CT MAXILLOFACIAL WITHOUT CONTRAST CT CERVICAL SPINE WITHOUT CONTRAST TECHNIQUE: Multidetector CT imaging of the head, cervical spine, and maxillofacial structures were performed using the standard protocol without intravenous contrast. Multiplanar CT image reconstructions of the cervical spine and maxillofacial structures were also generated. COMPARISON:  None. FINDINGS: CT HEAD FINDINGS Brain: There is no mass, hemorrhage or extra-axial collection. 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 No acute hemorrhage. The brain parenchyma is normal, without evidence of acute or chronic infarction. Vascular: No abnormal hyperdensity of the major intracranial arteries or dural venous sinuses. No intracranial atherosclerosis. Skull: Small frontal scalp hematoma.  No skull fracture. CT MAXILLOFACIAL FINDINGS Osseous: --Complex facial fracture types: No LeFort, zygomaticomaxillary complex or nasoorbitoethmoidal fracture. --Simple fracture types: None. --Mandible: No fracture or dislocation.  Orbits: The globes are intact. Normal appearance of the intra- and extraconal fat. Symmetric extraocular muscles and optic nerves. Sinuses: No fluid levels or advanced mucosal thickening. Soft tissues: Normal visualized extracranial soft tissues. CT CERVICAL SPINE FINDINGS Alignment: No static subluxation. Facets are aligned. Occipital condyles and the lateral masses of C1-C2 are aligned. Skull base and vertebrae: No acute fracture. Soft tissues and spinal canal: No prevertebral fluid or swelling. No visible canal hematoma. Disc levels: No advanced spinal canal or neural foraminal stenosis. Upper chest: No pneumothorax, pulmonary nodule or pleural effusion. Other: Normal visualized paraspinal cervical soft tissues. IMPRESSION: 1. 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 2. Small frontal scalp hematoma without skull fracture. 3. No facial fracture. 4. No acute fracture or static subluxation of the cervical spine. Electronically Signed   By: Deatra RobinsonKevin  Herman M.D.   On: 06/02/2021 03:18   CT Cervical Spine Wo Contrast  Result Date: 06/02/2021 CLINICAL DATA:  Multiple  falls.  Altered mental status. EXAM: CT HEAD WITHOUT CONTRAST CT MAXILLOFACIAL WITHOUT CONTRAST CT CERVICAL SPINE WITHOUT CONTRAST TECHNIQUE: Multidetector CT imaging of the head, cervical spine, and maxillofacial structures were performed using the standard protocol without intravenous contrast. Multiplanar CT image reconstructions of the cervical spine and maxillofacial structures were also generated. COMPARISON:  None. FINDINGS: CT HEAD FINDINGS Brain: There is no mass, hemorrhage or extra-axial collection. 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 No acute hemorrhage. The brain parenchyma is normal, without evidence of acute or chronic infarction. Vascular: No abnormal hyperdensity of the major intracranial arteries or dural venous sinuses.  No intracranial atherosclerosis. Skull: Small frontal scalp hematoma.  No skull fracture. CT MAXILLOFACIAL FINDINGS Osseous: --Complex facial fracture types: No LeFort, zygomaticomaxillary complex or nasoorbitoethmoidal fracture. --Simple fracture types: None. --Mandible: No fracture or dislocation. Orbits: The globes are intact. Normal appearance of the intra- and extraconal fat. Symmetric extraocular muscles and optic nerves. Sinuses: No fluid levels or advanced mucosal thickening. Soft tissues: Normal visualized extracranial soft tissues. CT CERVICAL SPINE FINDINGS Alignment: No static subluxation. Facets are aligned. Occipital condyles and the lateral masses of C1-C2 are aligned. Skull base and vertebrae: No acute fracture. Soft tissues and spinal canal: No prevertebral fluid or swelling. No visible canal hematoma. Disc levels: No advanced spinal canal or neural foraminal stenosis. Upper chest: No pneumothorax, pulmonary nodule or pleural effusion. Other: Normal visualized paraspinal cervical soft tissues. IMPRESSION: 1. 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 2. Small frontal scalp hematoma without skull fracture. 3. No facial fracture. 4. No acute fracture or static subluxation of the cervical spine. Electronically Signed   By: Deatra Robinson M.D.   On: 06/02/2021 03:18   CT Maxillofacial Wo Contrast  Result Date: 06/02/2021 CLINICAL DATA:  Multiple falls.  Altered mental status. EXAM: CT HEAD WITHOUT CONTRAST CT MAXILLOFACIAL WITHOUT CONTRAST CT CERVICAL SPINE WITHOUT CONTRAST TECHNIQUE: Multidetector CT imaging of the head, cervical spine, and maxillofacial structures were performed using the standard protocol without intravenous contrast. Multiplanar CT image reconstructions of the cervical spine and maxillofacial structures were also generated. COMPARISON:  None. FINDINGS: CT HEAD FINDINGS Brain: There is no mass, hemorrhage or extra-axial collection.  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 No acute hemorrhage. The brain parenchyma is normal, without evidence of acute or chronic infarction. Vascular: No abnormal hyperdensity of the major intracranial arteries or dural venous sinuses. No intracranial atherosclerosis. Skull: Small frontal scalp hematoma.  No skull fracture. CT MAXILLOFACIAL FINDINGS Osseous: --Complex facial fracture types: No LeFort, zygomaticomaxillary complex or nasoorbitoethmoidal fracture. --Simple fracture types: None. --Mandible: No fracture or dislocation. Orbits: The globes are intact. Normal appearance of the intra- and extraconal fat. Symmetric extraocular muscles and optic nerves. Sinuses: No fluid levels or advanced mucosal thickening. Soft tissues: Normal visualized extracranial soft tissues. CT CERVICAL SPINE FINDINGS Alignment: No static subluxation. Facets are aligned. Occipital condyles and the lateral masses of C1-C2 are aligned. Skull base and vertebrae: No acute fracture. Soft tissues and spinal canal: No prevertebral fluid or swelling. No visible canal hematoma. Disc levels: No advanced spinal canal or neural foraminal stenosis. Upper chest: No pneumothorax, pulmonary nodule or pleural effusion. Other: Normal visualized paraspinal cervical soft tissues. IMPRESSION: 1. 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 2. Small frontal scalp hematoma without skull fracture. 3. No facial fracture. 4. No acute fracture or static subluxation of the cervical spine. Electronically Signed   By: Caryn Bee  Chase Picket M.D.   On: 06/02/2021 03:18   CT HEAD WO CONTRAST ( )  Final Result    CT Cervical Spine Wo Contrast  Final Result    CT Maxillofacial Wo Contrast  Final Result    DG Chest 1 View  Final Result      Scheduled Meds:  enoxaparin (LOVENOX) injection  40 mg Subcutaneous Q24H   levothyroxine  25 mcg Intravenous  Daily   PRN Meds: acetaminophen **OR** acetaminophen, haloperidol lactate, ondansetron **OR** ondansetron (ZOFRAN) IV Continuous Infusions:  cefTRIAXone (ROCEPHIN)  IV 2 g (06/03/21 1013)   lactated ringers 100 mL/hr at 06/03/21 1412   valproate sodium 250 mg (06/03/21 1407)     LOS: 0 days  Time spent: Greater than 50% of the 35 minute visit was spent in counseling/coordination of care for the patient as laid out in the A&P.   Lewie Chamber, MD [facility] 06/03/2021, 4:33 PM

## 2021-06-03 NOTE — Progress Notes (Signed)
Called pt's caregiver Lucendia Herrlich and left a message b/c no answer requesting info on whether the patient has a medical hx of hypothyroidism and whether she's on thyroid medication. Lucendia Herrlich never called back. I placed the call at 1300 today.

## 2021-06-03 NOTE — Assessment & Plan Note (Addendum)
-   initially had some agitation in the ER on admission; after discussing with her caretaker bedside on 06/03/2021, she has not far off of baseline.  She is noted to often hit her head with her hands at times and due to being nonverbal has difficulty communicating and gets agitated easily.  We also discussed that her underlying neurocognitive dysfunction may simply be progressive at this point -Patient was resting comfortably in bed asleep as she has been doing more often lately this morning when caretaker was bedside -She appears to be close to her baseline or new baseline

## 2021-06-03 NOTE — Assessment & Plan Note (Addendum)
-   H&P mentions hypothyroidism but do not see this in PMH nor med on her med list - TSH has been recently elevated - Most recent TSH is 9.096 on 06/01/2021 - Check free T4 (elevated 1.5) and total T3 (120) -Her underlying diagnosis has been unable to be clarified from group home - since levels elevated will hold off on med for now - repeat thyroid studies in 4-6 weeks; if still abnormal, can undergo further testing

## 2021-06-03 NOTE — Assessment & Plan Note (Addendum)
-   Discharged on 06/01/2021 and was treated with Rocephin.  Repeat cultures were also negative from 05/31/2021.  She was discharged on 7 more days of Keflex 500 mg 4 times daily -IV access was lost.  Transitioning her back to Keflex (end date 8/25) from Rocephin -Okay to leave IV out

## 2021-06-04 ENCOUNTER — Other Ambulatory Visit: Payer: Self-pay

## 2021-06-04 MED ORDER — LOPERAMIDE HCL 2 MG PO CAPS: 2.0000 mg | ORAL_CAPSULE | ORAL | Status: DC | PRN

## 2021-06-04 MED ORDER — HYDROXYZINE HCL 25 MG PO TABS
25.0000 mg | ORAL_TABLET | Freq: Three times a day (TID) | ORAL | Status: DC | PRN
  Administered 2021-06-05 – 2021-06-14 (×12): 25 mg via ORAL
  Filled 2021-06-04 (×13): qty 1

## 2021-06-04 MED ORDER — CEPHALEXIN 500 MG PO CAPS
500.0000 mg | ORAL_CAPSULE | Freq: Four times a day (QID) | ORAL | Status: AC
  Administered 2021-06-04 – 2021-06-08 (×20): 500 mg via ORAL
  Filled 2021-06-04 (×19): qty 1

## 2021-06-04 MED ORDER — DULOXETINE HCL 30 MG PO CPEP
30.0000 mg | ORAL_CAPSULE | Freq: Every day | ORAL | Status: DC
  Administered 2021-06-04 – 2021-06-14 (×11): 30 mg via ORAL
  Filled 2021-06-04 (×11): qty 1

## 2021-06-04 MED ORDER — DULOXETINE HCL 30 MG PO CPEP
60.0000 mg | ORAL_CAPSULE | Freq: Every day | ORAL | Status: DC
  Administered 2021-06-04 – 2021-06-14 (×11): 60 mg via ORAL
  Filled 2021-06-04 (×11): qty 2

## 2021-06-04 MED ORDER — DIVALPROEX SODIUM 500 MG PO DR TAB
500.0000 mg | DELAYED_RELEASE_TABLET | Freq: Two times a day (BID) | ORAL | Status: DC
  Administered 2021-06-04 – 2021-06-30 (×52): 500 mg via ORAL
  Filled 2021-06-04 (×56): qty 1

## 2021-06-04 NOTE — Progress Notes (Signed)
Progress Note    Sabrina Johnston   ZOX:[id number]  DOB: 1956/02/14  DOA: 06/01/2021     1  PCP: Lauro Regulus, MD  Initial CC: agitation   Hospital Course: Sabrina Johnston is a 65 y.o. female with medical history significant for cognitive and neurobehavioral dysfunction, seizure disorder and chronic constipation, who presented to the emergency room 3 hours after her discharge for evaluation of multiple falls and agitation.   She was hospitalized for 6 days and treated for E. coli sepsis with IV antibiotics. 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 She was seen in consultation by neurology and had an EEG which did not show any seizure activity even the patient required 3 mg of IV Ativan for agitation prior to getting the EEG done. Patient was sent back to the emergency room for evaluation from the group home because of aggressive behavior and agitation.  She has sustained 3 falls and struck her head but there was no loss of consciousness or seizure-like activity. Labs in the emergency room showed a lactic acid of 5.6 which has since normalized. I am unable to do a review of systems on this patient since she is sedated and nonverbal. Labs show sodium 143, potassium 4.0, chloride 110, bicarb 23, glucose 83, BUN less than 5, creatinine 0.79, calcium 8.2, alkaline phosphatase 142, albumin 2.0, AST 74, ALT 32, total protein 5.2, ammonia 44, total CK2 52, lactic acid 5. >> 0.9, white count 9.6, hemoglobin 11.5, hematocrit 32.5, MCV 104.5, RDW 14.7, platelet count 236, PT 13.9, INR 1.1, TSH 9.09 Respiratory viral panel is negative Chest x-ray reviewed by me shows low lung volumes with no active disease. CT scan of the head without contrast/maxillofacial CT/CT scan of cervical spine shows  markedly enlarged lateral ventricles with colpocephaly configuration, likely congenital.  Small frontal scalp hematoma without skull fracture.  No facial fracture.  No acute fracture or static subluxation of the cervical spine. Twelve-lead EKG reviewed by me shows normal sinus rhythm with nonspecific ST changes.   ED Course: Patient is a 65 year old female with a history of cognitive and neurobehavioral dysfunction who resides in a group home and was discharged from the hospital on 06/01/21 after hospitalization for E. coli sepsis and metabolic encephalopathy. Patient returned to the emergency room 3 hours after her discharge for evaluation of multiple witnessed falls as well as aggressive behavior towards the staff. Imaging shows a right frontal hematoma and her initial lactic acid level was elevated at 5.6. She required sedation to get a CT scan of her head, cervical spine and maxillofacial CT to rule out any injuries and remains sedated. She was on a Lawyer transiently for hypothermia which has resolved. She will be referred to observation status for further evaluation.  Interval History:  Seen this morning in room with her caretaker bedside.  Patient was asleep and laying on her right side and would not engage or follow commands.  Typically she would get agitated if someone touches her or moves her around but she did not do any of this during my exam with caretaker bedside.  ROS: Review of systems not obtained due to patient factors.  Cognitive impairment  Assessment & Plan: * Acute metabolic encephalopathy - initially had some agitation in the ER on admission; after discussing with her caretaker  bedside on 06/03/2021, she has not far off of baseline.  She is noted to often hit her head with her hands at times and due to being nonverbal has difficulty communicating and gets agitated easily.  We also discussed that her underlying neurocognitive dysfunction may simply be progressive at this  point -Patient was resting comfortably in bed asleep as she has been doing more often lately this morning when caretaker was bedside  Abnormal TSH - H&P mentions hypothyroidism but do not see this on her med list - TSH has been recently elevated - Most recent TSH is 9.096 on 06/01/2021 - Check free T4 (elevated 1.5) and total T3 - will try and clarify further if patient has diagnosis and dosing of her Synthroid  Agitation - Intermittent and may be due to her underlying cognitive disorders.  No acute etiology to explain otherwise at this time  Bacteremia due to Gram-negative bacteria - Discharged on 06/01/2021 and was treated with Rocephin.  Repeat cultures were also negative from 05/31/2021.  She was discharged on 7 more days of Keflex 500 mg 4 times daily -IV access was lost.  Transitioning her back to Keflex from Rocephin -Okay to leave IV out  Severe intellectual disability - Patient has significant neurocognitive dysfunction and developmental disorder.  Was able to ambulate prior to hospitalizations but recently now is eating less and not walking anymore; caretaker states this is different for her. I discussed that at her age with her diagnoses it's not unreasonable that she may be starting to decline (as we would see with a typical end stage dementia patient).  - not much further to offer at this time other than supportive care  Generalized epilepsy (HCC) - Valproic acid normal - Continue Depakote   Old records reviewed in assessment of this patient  Antimicrobials: Rocephin 06/02/2021 >> 8/21 Keflex 8/21 >> current   DVT prophylaxis:    Code Status:   Code Status: Full Code Family Communication: Caretaker  Disposition Plan: Status is: Inpatient  Remains inpatient appropriate because:Altered mental status, IV treatments appropriate due to intensity of illness or inability to take PO, and Inpatient level of care appropriate due to severity of illness  Dispo: The patient is  from:  [facility]              Anticipated d/c is to:  same              Patient currently is medically stable to d/c.   Difficult to place patient No Risk of unplanned readmission score: Unplanned Admission- Pilot do not use: 14.72   Objective: Blood pressure 113/61, pulse (!) 102, temperature 98.5 F (36.9 C), resp. rate 16, height 5' (1.524 m), weight 61.2 kg, SpO2 95 %.  Examination: General appearance:  chronically ill appearing; laying on right side in NAD; does not arouse or engage in exam Head:  asymmetric head Eyes:  pupils equally round Lungs: clear to auscultation bilaterally Heart: regular rate and rhythm and S1, S2 normal Abdomen: normal findings: bowel sounds normal and soft, non-tender Extremities:  right had 1st digit noted with 2 serous filled bullae; contracted LUE Skin: normal Neurologic: unable to fully assess; nonverbal. Does not follow commands  Consultants:    Procedures:    Data Reviewed: I have personally reviewed following labs and imaging studies Results for orders placed or performed during the hospital encounter of 06/01/21 (from the past 24 hour(s))  T4, free     Status: Abnormal   Collection Time:  06/03/21  6:04 PM  Result Value Ref Range   Free T4 1.50 (H) 0.61 - 1.12 ng/dL  TSH     Status: Abnormal   Collection Time: 06/03/21  6:04 PM  Result Value Ref Range   TSH 6.792 (H) 0.350 - 4.500 uIU/mL  Glucose, capillary     Status: None   Collection Time: 06/03/21 10:19 PM  Result Value Ref Range   Glucose-Capillary 93 70 - 99 mg/dL    Recent Results (from the past 240 hour(s))  Resp Panel by RT-PCR (Flu A&B, Covid)     Status: None   Collection Time: 05/26/21  8:40 PM   Specimen: Nasopharyngeal(NP) swabs in vial transport medium  Result Value Ref Range Status   SARS Coronavirus 2 by RT PCR NEGATIVE NEGATIVE Final    Comment: (NOTE) SARS-CoV-2 target nucleic acids are NOT DETECTED.  The SARS-CoV-2 RNA is generally detectable  in upper respiratory specimens during the acute phase of infection. The lowest concentration of SARS-CoV-2 viral copies this assay can detect is 138 copies/mL. A negative result does not preclude SARS-Cov-2 infection and should not be used as the sole basis for treatment or other patient management decisions. A negative result may occur with  improper specimen collection/handling, submission of specimen other than nasopharyngeal swab, presence of viral mutation(s) within the areas targeted by this assay, and inadequate number of viral copies(<138 copies/mL). A negative result must be combined with clinical observations, patient history, and epidemiological information. The expected result is Negative.  Fact Sheet for Patients:  BloggerCourse.com  Fact Sheet for Healthcare Providers:  SeriousBroker.it  This test is no t yet approved or cleared by the [place] FDA and  has been authorized for detection and/or diagnosis of SARS-CoV-2 by FDA under an Emergency Use Authorization (EUA). This EUA will remain  in effect (meaning this test can be used) for the duration of the COVID-19 declaration under Section 564(b)(1) of the Act, 21 U.S.C.section 360bbb-3(b)(1), unless the authorization is terminated  or revoked sooner.       Influenza A by PCR NEGATIVE NEGATIVE Final   Influenza B by PCR NEGATIVE NEGATIVE Final    Comment: (NOTE) The Xpert Xpress SARS-CoV-2/FLU/RSV plus assay is intended as an aid in the diagnosis of influenza from Nasopharyngeal swab specimens and should not be used as a sole basis for treatment. Nasal washings and aspirates are unacceptable for Xpert Xpress SARS-CoV-2/FLU/RSV testing.  Fact Sheet for Patients: BloggerCourse.com  Fact Sheet for Healthcare Providers: SeriousBroker.it  This test is not yet approved or cleared by the [place] FDA and has  been authorized for detection and/or diagnosis of SARS-CoV-2 by FDA under an Emergency Use Authorization (EUA). This EUA will remain in effect (meaning this test can be used) for the duration of the COVID-19 declaration under Section 564(b)(1) of the Act, 21 U.S.C. section 360bbb-3(b)(1), unless the authorization is terminated or revoked.  Performed at [facility], [address] [place], [place] [address]   Urine Culture     Status: Abnormal   Collection Time: 05/26/21  8:40 PM   Specimen: Urine, Clean Catch  Result Value Ref Range Status   Specimen Description   Final    URINE, CLEAN CATCH Performed at [facility], [address] [place], [place] [address]    Special Requests   Final    NONE Performed at [facility], [address] [place], [place] [address]    Culture >=100,000 COLONIES/mL ESCHERICHIA COLI (A)  Final  Report Status 05/29/2021 FINAL  Final   Organism ID, Bacteria ESCHERICHIA COLI (A)  Final      Susceptibility   Escherichia coli - MIC*    AMPICILLIN >=32 RESISTANT Resistant     CEFAZOLIN <=4 SENSITIVE Sensitive     CEFEPIME <=0.12 SENSITIVE Sensitive     CEFTRIAXONE <=0.25 SENSITIVE Sensitive     CIPROFLOXACIN <=0.25 SENSITIVE Sensitive     GENTAMICIN >=16 RESISTANT Resistant     IMIPENEM <=0.25 SENSITIVE Sensitive     NITROFURANTOIN <=16 SENSITIVE Sensitive     TRIMETH/SULFA >=320 RESISTANT Resistant     AMPICILLIN/SULBACTAM >=32 RESISTANT Resistant     PIP/TAZO <=4 SENSITIVE Sensitive     * >=100,000 COLONIES/mL ESCHERICHIA COLI  Blood culture (routine single)     Status: Abnormal   Collection Time: 05/27/21  9:57 AM   Specimen: BLOOD  Result Value Ref Range Status   Specimen Description   Final    BLOOD BLOOD RIGHT ARM Performed at [facility], [address] [place], [place] [address]    Special Requests   Final    BOTTLES DRAWN AEROBIC AND ANAEROBIC Blood Culture adequate volume Performed at  [facility] [address] [place], [place] [address]    Culture  Setup Time   Final    GRAM NEGATIVE RODS IN BOTH AEROBIC AND ANAEROBIC BOTTLES CRITICAL RESULT CALLED TO, READ BACK BY AND VERIFIED WITH: NATHAN BELUE @ 2320 05/27/21 LFD Performed at [facility], [address], [place], [place] [address]    Culture ESCHERICHIA COLI (A)  Final   Report Status 05/30/2021 FINAL  Final   Organism ID, Bacteria ESCHERICHIA COLI  Final      Susceptibility   Escherichia coli - MIC*    AMPICILLIN >=32 RESISTANT Resistant     CEFAZOLIN <=4 SENSITIVE Sensitive     CEFEPIME <=0.12 SENSITIVE Sensitive     CEFTAZIDIME <=1 SENSITIVE Sensitive     CEFTRIAXONE <=0.25 SENSITIVE Sensitive     CIPROFLOXACIN <=0.25 SENSITIVE Sensitive     GENTAMICIN >=16 RESISTANT Resistant     IMIPENEM <=0.25 SENSITIVE Sensitive     TRIMETH/SULFA >=320 RESISTANT Resistant     AMPICILLIN/SULBACTAM >=32 RESISTANT Resistant     PIP/TAZO <=4 SENSITIVE Sensitive     * ESCHERICHIA COLI  Blood Culture ID Panel (Reflexed)     Status: Abnormal (Preliminary result)   Collection Time: 05/27/21  9:57 AM  Result Value Ref Range Status   Enterococcus faecalis NOT DETECTED NOT DETECTED Final   Enterococcus Faecium NOT DETECTED NOT DETECTED Final   Listeria monocytogenes NOT DETECTED NOT DETECTED Final   Staphylococcus species NOT DETECTED NOT DETECTED Final   Staphylococcus aureus (BCID) PENDING NOT DETECTED Incomplete   Staphylococcus epidermidis PENDING NOT DETECTED Incomplete   Staphylococcus lugdunensis PENDING NOT DETECTED Incomplete   Streptococcus species NOT DETECTED NOT DETECTED Final   Streptococcus agalactiae NOT DETECTED NOT DETECTED Final   Streptococcus pneumoniae NOT DETECTED NOT DETECTED Final   Streptococcus pyogenes NOT DETECTED NOT DETECTED Final   A.calcoaceticus-baumannii NOT DETECTED NOT DETECTED Final   Bacteroides fragilis NOT DETECTED NOT DETECTED Final   Enterobacterales DETECTED  (A) NOT DETECTED Final    Comment: Enterobacterales represent a large order of gram negative bacteria, not a single organism. CRITICAL RESULT CALLED TO, READ BACK BY AND VERIFIED WITH: NATHAN BELUE @ 2320 05/27/21 LFD    Enterobacter cloacae complex NOT DETECTED NOT DETECTED Final   Escherichia coli DETECTED (A) NOT DETECTED Final  Comment: CRITICAL RESULT CALLED TO, READ BACK BY AND VERIFIED WITH: NATHAN BELUE @ 2320 05/27/21 LFD     Klebsiella aerogenes NOT DETECTED NOT DETECTED Final   Klebsiella oxytoca NOT DETECTED NOT DETECTED Final   Klebsiella pneumoniae NOT DETECTED NOT DETECTED Final   Proteus species NOT DETECTED NOT DETECTED Final   Salmonella species NOT DETECTED NOT DETECTED Final   Serratia marcescens NOT DETECTED NOT DETECTED Final   Haemophilus influenzae NOT DETECTED NOT DETECTED Final   Neisseria meningitidis NOT DETECTED NOT DETECTED Final   Pseudomonas aeruginosa NOT DETECTED NOT DETECTED Final   Stenotrophomonas maltophilia NOT DETECTED NOT DETECTED Final   Candida albicans NOT DETECTED NOT DETECTED Final   Candida auris NOT DETECTED NOT DETECTED Final   Candida glabrata NOT DETECTED NOT DETECTED Final   Candida krusei NOT DETECTED NOT DETECTED Final   Candida parapsilosis NOT DETECTED NOT DETECTED Final   Candida tropicalis NOT DETECTED NOT DETECTED Final   Cryptococcus neoformans/gattii NOT DETECTED NOT DETECTED Final   CTX-M ESBL NOT DETECTED NOT DETECTED Final   Carbapenem resistance IMP NOT DETECTED NOT DETECTED Final   Carbapenem resistance KPC NOT DETECTED NOT DETECTED Final   Carbapenem resistance NDM NOT DETECTED NOT DETECTED Final   Carbapenem resist OXA 48 LIKE NOT DETECTED NOT DETECTED Final   Carbapenem resistance VIM NOT DETECTED NOT DETECTED Final    Comment: Performed at [facility], [address] [place], [place] [address]  CULTURE, BLOOD (ROUTINE X 2) w Reflex to ID Panel     Status: None (Preliminary result)   Collection Time:  05/31/21 11:08 AM   Specimen: BLOOD  Result Value Ref Range Status   Specimen Description BLOOD  BRH  Final   Special Requests BOTTLES DRAWN AEROBIC ONLY  BCAV  Final   Culture   Final    NO GROWTH 4 DAYS Performed at [facility], [address] [place], [place] [address]    Report Status PENDING  Incomplete  CULTURE, BLOOD (ROUTINE X 2) w Reflex to ID Panel     Status: None (Preliminary result)   Collection Time: 05/31/21  3:34 PM   Specimen: BLOOD  Result Value Ref Range Status   Specimen Description BLOOD BLOOD RIGHT HAND  Final   Special Requests   Final    BOTTLES DRAWN AEROBIC AND ANAEROBIC Blood Culture adequate volume   Culture   Final    NO GROWTH 4 DAYS Performed at [facility], [address] [place], [place] [address]    Report Status PENDING  Incomplete  Culture, blood (routine x 2)     Status: None (Preliminary result)   Collection Time: 06/01/21  7:50 PM   Specimen: BLOOD  Result Value Ref Range Status   Specimen Description BLOOD BLOOD RIGHT WRIST  Final   Special Requests   Final    BOTTLES DRAWN AEROBIC AND ANAEROBIC Blood Culture results may not be optimal due to an inadequate volume of blood received in culture bottles   Culture   Final    NO GROWTH 3 DAYS Performed at [facility], [address] [place], [place] [address]    Report Status PENDING  Incomplete  Culture, blood (routine x 2)     Status: None (Preliminary result)   Collection Time: 06/01/21 10:36 PM   Specimen: BLOOD  Result Value Ref Range Status   Specimen Description BLOOD LEFT HAND  Final   Special Requests   Final    BOTTLES DRAWN AEROBIC  AND ANAEROBIC Blood Culture results may not be optimal due to an inadequate volume of blood received in culture bottles   Culture   Final    NO GROWTH 3 DAYS Performed at [facility], [address] [place], [place] [address]    Report Status PENDING  Incomplete  Urine Culture     Status: None    Collection Time: 06/02/21 12:18 AM   Specimen: Urine, Random  Result Value Ref Range Status   Specimen Description   Final    URINE, RANDOM Performed at [facility], [address] [place], [place] [address]    Special Requests   Final    NONE Performed at [facility], [address], [place], [place] [address]    Culture   Final    NO GROWTH Performed at [facility], [address], [place], [place] [address]    Report Status 06/03/2021 FINAL  Final  Resp Panel by RT-PCR (Flu A&B, Covid) Urine, Clean Catch     Status: None   Collection Time: 06/02/21 12:18 AM   Specimen: Urine, Clean Catch; Nasopharyngeal(NP) swabs in vial transport medium  Result Value Ref Range Status   SARS Coronavirus 2 by RT PCR NEGATIVE NEGATIVE Final    Comment: (NOTE) SARS-CoV-2 target nucleic acids are NOT DETECTED.  The SARS-CoV-2 RNA is generally detectable in upper respiratory specimens during the acute phase of infection. The lowest concentration of SARS-CoV-2 viral copies this assay can detect is 138 copies/mL. A negative result does not preclude SARS-Cov-2 infection and should not be used as the sole basis for treatment or other patient management decisions. A negative result may occur with  improper specimen collection/handling, submission of specimen other than nasopharyngeal swab, presence of viral mutation(s) within the areas targeted by this assay, and inadequate number of viral copies(<138 copies/mL). A negative result must be combined with clinical observations, patient history, and epidemiological information. The expected result is Negative.  Fact Sheet for Patients:  BloggerCourse.com  Fact Sheet for Healthcare Providers:  SeriousBroker.it  This test is no t yet approved or cleared by the [place] FDA and  has been authorized for detection and/or diagnosis of SARS-CoV-2 by FDA under an Emergency  Use Authorization (EUA). This EUA will remain  in effect (meaning this test can be used) for the duration of the COVID-19 declaration under Section 564(b)(1) of the Act, 21 U.S.C.section 360bbb-3(b)(1), unless the authorization is terminated  or revoked sooner.       Influenza A by PCR NEGATIVE NEGATIVE Final   Influenza B by PCR NEGATIVE NEGATIVE Final    Comment: (NOTE) The Xpert Xpress SARS-CoV-2/FLU/RSV plus assay is intended as an aid in the diagnosis of influenza from Nasopharyngeal swab specimens and should not be used as a sole basis for treatment. Nasal washings and aspirates are unacceptable for Xpert Xpress SARS-CoV-2/FLU/RSV testing.  Fact Sheet for Patients: BloggerCourse.com  Fact Sheet for Healthcare Providers: SeriousBroker.it  This test is not yet approved or cleared by the [place] FDA and has been authorized for detection and/or diagnosis of SARS-CoV-2 by FDA under an Emergency Use Authorization (EUA). This EUA will remain in effect (meaning this test can be used) for the duration of the COVID-19 declaration under Section 564(b)(1) of the Act, 21 U.S.C. section 360bbb-3(b)(1), unless the authorization is terminated or revoked.  Performed at [facility], [address], [place], [place] [address]      Radiology Studies: No results found. CT HEAD  WO CONTRAST ( )  Final Result    CT Cervical Spine Wo Contrast  Final Result    CT Maxillofacial Wo Contrast  Final Result    DG Chest 1 View  Final Result      Scheduled Meds:  cephALEXin  500 mg Oral QID   divalproex  500 mg Oral BID   DULoxetine  30 mg Oral Daily   DULoxetine  60 mg Oral Daily   PRN Meds: acetaminophen **OR** acetaminophen, hydrOXYzine, loperamide, ondansetron **OR** ondansetron (ZOFRAN) IV Continuous Infusions:     LOS: 1 day  Time spent: Greater than 50% of the 35 minute visit was spent in  counseling/coordination of care for the patient as laid out in the A&P.   Lewie Chamber, MD [facility] 06/04/2021, 3:36 PM

## 2021-06-04 NOTE — Progress Notes (Signed)
   06/04/21 0800  Assess: MEWS Score  Pulse Rate 96  Assess: MEWS Score  MEWS Temp 0  MEWS Systolic 0  MEWS Pulse 0  MEWS RR 0  MEWS LOC 1  MEWS Score 1  MEWS Score Color Green  Assess: if the MEWS score is Yellow or Red  Were vital signs taken at a resting state? No (Patient resisting)  Focused Assessment Change from prior assessment (see assessment flowsheet)  Does the patient meet 2 or more of the SIRS criteria? No  MEWS guidelines implemented *See Row Information* No, vital signs rechecked  Treat  MEWS Interventions Escalated (See documentation below) Engineer, manufacturing systems -Alvino Chapel)  Pain Scale Faces  Pain Score 0  Faces Pain Scale 0  Breathing 0  Negative Vocalization 0  Facial Expression 0  Body Language 0  Consolability 0  PAINAD Score 0  Complains of Agitation (PAtient gets agitated any time you mess with her)  Interventions Relaxation  Neuro symptoms relieved by Rest  Patients response to intervention Effective  Escalate  MEWS: Escalate Yellow: discuss with charge nurse/RN and consider discussing with provider and RRT  Notify: Charge Nurse/RN  Name of Charge Nurse/RN Notified Alvino Chapel  Date Charge Nurse/RN Notified 06/04/21  Time Charge Nurse/RN Notified 0800  Assess: SIRS CRITERIA  SIRS Temperature  0  SIRS Pulse 1  SIRS Respirations  0  SIRS WBC 0  SIRS Score Sum  1

## 2021-06-04 NOTE — Evaluation (Signed)
Physical Therapy Evaluation Patient Details Name: Sabrina Johnston MRN: [id number] DOB: [birth year] Today's Date: 06/04/2021   History of Present Illness  Pt is a 65 y/o F admitted on 06/01/21 from a group home with c/o aggressive behavior & agitation. CT revealed small frontal scalp hematoma without skull fx. Pt was recently discharged 3 hours prior. She was previously evaluated for multiple falls & agitation where she was hospitalized for 6 days & treated for E. coli sepsis. 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 PMH: cognitive & neurobehavioral dysfunction, seizure disorder, chronic constipation  Clinical Impression  Pt seen for PT evaluation with NT present & just finished bathing pt. Pt requires min assist supine>sit, mod assist to return supine, min assist for transfers & gait with min assist HHA. Pt noted to have intellectual disabilities at baseline & requires max multimodal cuing throughout session. Pt appears to have some balance deficits during gait. Will see pt on trial PT bases to maintain mobility & address balance & gait with LRAD. Recommend 24 hr assistance at d/c.     Follow Up Recommendations Supervision/Assistance - 24 hour    Equipment Recommendations  None recommended by PT    Recommendations for Other Services       Precautions / Restrictions Precautions Precautions: Fall Restrictions Weight Bearing Restrictions: No      Mobility  Bed Mobility Overal bed mobility: Needs Assistance Bed Mobility: Supine to Sit;Sit to Supine     Supine to sit: Min assist Sit to supine: Mod assist   General bed mobility comments: Min assist to upright trunk during supine>sit. Pt attempts to crawl into bed at end of session with PT assisting with ensuring LE are placed on bed.    Transfers Overall transfer level: Needs assistance Equipment used: 1 person hand held assist Transfers: Sit to/from Stand Sit to Stand: Min assist (HHA)             Ambulation/Gait Ambulation/Gait assistance: Min assist Gait Distance (Feet): 25 Feet Assistive device: 1 person hand held assist Gait Pattern/deviations: Decreased step length - right;Decreased step length - left;Decreased stride length Gait velocity: decreased   General Gait Details: Pt ambulates to door & back with 1UE HHA min assist. Unable to follow commands to step back to allow PT to open door.  Stairs            Wheelchair Mobility    Modified Rankin (Stroke Patients Only)       Balance Overall balance assessment: Needs assistance Sitting-balance support: Feet supported Sitting balance-Leahy Scale: Fair     Standing balance support: Single extremity supported;During functional activity Standing balance-Leahy Scale: Poor                               Pertinent Vitals/Pain Pain Assessment: Faces Faces Pain Scale: No hurt    Home Living Family/patient expects to be discharged to:: Group home                      Prior Function           Comments: Per chart, pt is nonverbal, ambulatory without AD, from a group home.     Hand Dominance   Dominant Hand: Right    Extremity/Trunk Assessment   Upper Extremity Assessment Upper Extremity Assessment: Difficult to assess due to impaired cognition    Lower Extremity Assessment Lower Extremity Assessment: Generalized weakness;Difficult to assess due  to impaired cognition       Communication   Communication:  (non-verbal at baseline)  Cognition Arousal/Alertness: Awake/alert Behavior During Therapy: Flat affect Overall Cognitive Status: History of cognitive impairments - at baseline                                 General Comments: Pt with intellectual disabilites, poor ability to follow commands, impaired safety awareness, memory. Requires tactile/verbal cuing & at times light manual facilitation (to turn around) to perform functional mobility.      General  Comments      Exercises     Assessment/Plan    PT Assessment Patient needs continued PT services  PT Problem List Decreased strength;Decreased mobility;Decreased safety awareness;Decreased range of motion;Decreased coordination;Decreased cognition;Decreased balance;Decreased knowledge of use of DME       PT Treatment Interventions DME instruction;Therapeutic activities;Modalities;Gait training;Therapeutic exercise;Patient/family education;Stair training;Balance training;Neuromuscular re-education;Functional mobility training;Manual techniques    PT Goals (Current goals can be found in the Care Plan section)  Acute Rehab PT Goals PT Goal Formulation: Patient unable to participate in goal setting Time For Goal Achievement: 06/18/21 Potential to Achieve Goals: Fair    Frequency Min 2X/week   Barriers to discharge Decreased caregiver support;Inaccessible home environment      Co-evaluation               AM-PAC PT "6 Clicks" Mobility  Outcome Measure Help needed turning from your back to your side while in a flat bed without using bedrails?: A Little Help needed moving from lying on your back to sitting on the side of a flat bed without using bedrails?: A Lot Help needed moving to and from a bed to a chair (including a wheelchair)?: A Little Help needed standing up from a chair using your arms (e.g., wheelchair or bedside chair)?: A Little Help needed to walk in hospital room?: A Little Help needed climbing 3-5 steps with a railing? : A Lot 6 Click Score: 16    End of Session   Activity Tolerance: Patient tolerated treatment well Patient left: in bed;with bed alarm set;with nursing/sitter in room;with call bell/phone within reach   PT Visit Diagnosis: Unsteadiness on feet (R26.81);Difficulty in walking, not elsewhere classified (R26.2);Muscle weakness (generalized) (M62.81)    Time: 7939-0300 PT Time Calculation (min) (ACUTE ONLY): 10 min   Charges:   PT  Evaluation $PT Eval High Complexity: 1 High          [name], PT, DPT 06/04/21, 11:17 AM   [name] 06/04/2021, 11:15 AM

## 2021-06-04 NOTE — Consult Note (Signed)
WOC Nurse Consult Note: Reason for Consult: Bedside RN reports right hand and several fingers have intact and ruptured blisters (serum filled). Index finger in particular has a ruptured blister. Wound type: Trauma Pressure Injury POA: N/A Measurement: No measured, no notation as of this time on the Nursing Flow Sheet Wound OYD:XAJO, moist Drainage (amount, consistency, odor) serous Periwound: with peeling tissue (epidermis) Dressing procedure/placement/frequency: Twice daily white petrolatum gauze (Vaseline) application to affected areas after NS cleanse. Impregnated gauze is to be topped with dry gauze and secured with Kerlix roll gauze/paper tape.  WOC nursing team will not follow, but will remain available to this patient, the nursing and medical teams.  Please re-consult if needed. Thanks, Ladona Mow, MSN, RN, GNP, [facility]  Pager# [phone]

## 2021-06-05 DIAGNOSIS — L89892 Pressure ulcer of other site, stage 2: Secondary | ICD-10-CM

## 2021-06-05 DIAGNOSIS — L8992 Pressure ulcer of unspecified site, stage 2: Secondary | ICD-10-CM

## 2021-06-05 DIAGNOSIS — R7881 Bacteremia: Secondary | ICD-10-CM

## 2021-06-05 LAB — CULTURE, BLOOD (ROUTINE X 2)
Culture: NO GROWTH
Culture: NO GROWTH
Special Requests: ADEQUATE

## 2021-06-05 NOTE — Progress Notes (Signed)
Progress Note    Sabrina Johnston   VFI:[id number]  DOB: [birth year]  DOA: 06/01/2021     2  PCP: Lauro Regulus, MD  Initial CC: agitation   Hospital Course: Sabrina Johnston is a 65 y.o. female with medical history significant for cognitive and neurobehavioral dysfunction, seizure disorder and chronic constipation, who presented to the emergency room 3 hours after her discharge for evaluation of multiple falls and agitation.   She was hospitalized for 6 days and treated for E. coli sepsis with IV antibiotics. 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 She was seen in consultation by neurology and had an EEG which did not show any seizure activity even the patient required 3 mg of IV Ativan for agitation prior to getting the EEG done. Patient was sent back to the emergency room for evaluation from the group home because of aggressive behavior and agitation.  She has sustained 3 falls and struck her head but there was no loss of consciousness or seizure-like activity. Labs in the emergency room showed a lactic acid of 5.6 which has since normalized. I am unable to do a review of systems on this patient since she is sedated and nonverbal. Labs show sodium 143, potassium 4.0, chloride 110, bicarb 23, glucose 83, BUN less than 5, creatinine 0.79, calcium 8.2, alkaline phosphatase 142, albumin 2.0, AST 74, ALT 32, total protein 5.2, ammonia 44, total CK2 52, lactic acid 5. >> 0.9, white count 9.6, hemoglobin 11.5, hematocrit 32.5, MCV 104.5, RDW 14.7, platelet count 236, PT 13.9, INR 1.1, TSH 9.09 Respiratory viral panel is negative Chest x-ray reviewed by me shows low lung volumes with no active disease. CT scan of the head without contrast/maxillofacial CT/CT scan of cervical spine shows  markedly enlarged lateral ventricles with colpocephaly configuration, likely congenital.  Small frontal scalp hematoma without skull fracture.  No facial fracture.  No acute fracture or static subluxation of the cervical spine. Twelve-lead EKG reviewed by me shows normal sinus rhythm with nonspecific ST changes.   ED Course: Patient is a 65 year old female with a history of cognitive and neurobehavioral dysfunction who resides in a group home and was discharged from the hospital on 06/01/21 after hospitalization for E. coli sepsis and metabolic encephalopathy. Patient returned to the emergency room 3 hours after her discharge for evaluation of multiple witnessed falls as well as aggressive behavior towards the staff. Imaging shows a right frontal hematoma and her initial lactic acid level was elevated at 5.6. She required sedation to get a CT scan of her head, cervical spine and maxillofacial CT to rule out any injuries and remains sedated. She was on a Lawyer transiently for hypothermia which has resolved.  Interval History:  No events overnight.  She remains essentially bedbound and has not been able to do much with physical therapy.  Group home is requesting SNF evaluation. She could otherwise leave the hospital from a medical standpoint but we will have PT/OT evaluate and pursue next steps.  ROS: Review of systems not obtained due to patient factors.  Cognitive impairment  Assessment & Plan: * Acute metabolic encephalopathy - initially had some agitation in the ER on admission; after discussing with her caretaker bedside on 06/03/2021, she has not far off of baseline.  She is noted to often hit her  head with her hands at times and due to being nonverbal has difficulty communicating and gets agitated easily.  We also discussed that her underlying neurocognitive dysfunction may simply be progressive at this point -Patient was resting comfortably in bed asleep as she has been doing more often  lately this morning when caretaker was bedside -She appears to be close to her baseline or new baseline  Abnormal TSH - H&P mentions hypothyroidism but do not see this in PMH nor med on her med list - TSH has been recently elevated - Most recent TSH is 9.096 on 06/01/2021 - Check free T4 (elevated 1.5) and total T3 - will try and clarify further if patient has diagnosis and dosing of her Synthroid - since levels elevated will hold off on med for now; can be clarified on return to her facility as well - repeat TSH in 4-6 weeks  Agitation-resolved as of 06/05/2021 - Intermittent and may be due to her underlying cognitive disorders.  No acute etiology to explain otherwise at this time -Has been calm and cooperative since 06/04/2021  Bacteremia due to Gram-negative bacteria - Discharged on 06/01/2021 and was treated with Rocephin.  Repeat cultures were also negative from 05/31/2021.  She was discharged on 7 more days of Keflex 500 mg 4 times daily -IV access was lost.  Transitioning her back to Keflex from Rocephin -Okay to leave IV out  Severe intellectual disability - Patient has significant neurocognitive dysfunction and developmental disorder.  Was able to ambulate prior to hospitalizations but recently now is eating less and not walking anymore; caretaker states this is different for her. I discussed that at her age with her diagnoses it's not unreasonable that she may be starting to decline (as we would see with a typical end stage dementia patient).  - not much further to offer at this time other than supportive care -Group home is asking for evaluation for possible SNF.  Patient may not be rehabable at this point but we will attempt. Otherwise this may be her new baseline and as I mentioned prior, her underlying neurocog impairments may simply be progressing  Pressure ulcer with abrasion, blister, partial thickness skin loss involving epidermis and/or dermis (HCC) - Pressure blister noted  on patient's right hand on admission mostly involving first digit but now involving some of the third digit.  Expect these to rupture spontaneously and the first digit already has - Try to offload weight from extremities; patient appears to like to lay on her right arm especially in a severely contracted state - Continue Xeroform dressing to ruptured sites and cover with gauze to prevent her from trying to eat the dressings  Generalized epilepsy (HCC) - Valproic acid normal - Continue Depakote   Old records reviewed in assessment of this patient  Antimicrobials: Rocephin 06/02/2021 >> 8/21 Keflex 8/21 >> current   DVT prophylaxis:    Code Status:   Code Status: Full Code Family Communication: Caretaker  Disposition Plan: Status is: Inpatient  Remains inpatient appropriate because:Altered mental status, IV treatments appropriate due to intensity of illness or inability to take PO, and Inpatient level of care appropriate due to severity of illness  Dispo: The patient is from:  [facility]              Anticipated d/c is to: Pending further PT/OT assessments              Patient currently is medically stable to d/c.   Difficult to place  patient No Risk of unplanned readmission score: Unplanned Admission- Pilot do not use: 14.94   Objective: Blood pressure 115/70, pulse 87, temperature 98.4 F (36.9 C), temperature source Oral, resp. rate 14, height 5' (1.524 m), weight 61.2 kg, SpO2 95 %.  Examination: General appearance:  chronically ill appearing; laying in bed grabbing at staff's hands and trying to eat the Xeroform gauze from her finger Head:  asymmetric head Eyes:  pupils equally round Lungs: clear to auscultation bilaterally Heart: regular rate and rhythm and S1, S2 normal Abdomen: normal findings: bowel sounds normal and soft, non-tender Extremities:  right had 1st digit bulla is now ruptured and covered with dressing; contracted LUE Skin:  normal Neurologic: unable to fully assess; nonverbal. Does not follow commands  Consultants:    Procedures:    Data Reviewed: I have personally reviewed following labs and imaging studies No results found for this or any previous visit (from the past 24 hour(s)).   Recent Results (from the past 240 hour(s))  Resp Panel by RT-PCR (Flu A&B, Covid)     Status: None   Collection Time: 05/26/21  8:40 PM   Specimen: Nasopharyngeal(NP) swabs in vial transport medium  Result Value Ref Range Status   SARS Coronavirus 2 by RT PCR NEGATIVE NEGATIVE Final    Comment: (NOTE) SARS-CoV-2 target nucleic acids are NOT DETECTED.  The SARS-CoV-2 RNA is generally detectable in upper respiratory specimens during the acute phase of infection. The lowest concentration of SARS-CoV-2 viral copies this assay can detect is 138 copies/mL. A negative result does not preclude SARS-Cov-2 infection and should not be used as the sole basis for treatment or other patient management decisions. A negative result may occur with  improper specimen collection/handling, submission of specimen other than nasopharyngeal swab, presence of viral mutation(s) within the areas targeted by this assay, and inadequate number of viral copies(<138 copies/mL). A negative result must be combined with clinical observations, patient history, and epidemiological information. The expected result is Negative.  Fact Sheet for Patients:  BloggerCourse.com  Fact Sheet for Healthcare Providers:  SeriousBroker.it  This test is no t yet approved or cleared by the [place] FDA and  has been authorized for detection and/or diagnosis of SARS-CoV-2 by FDA under an Emergency Use Authorization (EUA). This EUA will remain  in effect (meaning this test can be used) for the duration of the COVID-19 declaration under Section 564(b)(1) of the Act, 21 U.S.C.section 360bbb-3(b)(1), unless the  authorization is terminated  or revoked sooner.       Influenza A by PCR NEGATIVE NEGATIVE Final   Influenza B by PCR NEGATIVE NEGATIVE Final    Comment: (NOTE) The Xpert Xpress SARS-CoV-2/FLU/RSV plus assay is intended as an aid in the diagnosis of influenza from Nasopharyngeal swab specimens and should not be used as a sole basis for treatment. Nasal washings and aspirates are unacceptable for Xpert Xpress SARS-CoV-2/FLU/RSV testing.  Fact Sheet for Patients: BloggerCourse.com  Fact Sheet for Healthcare Providers: SeriousBroker.it  This test is not yet approved or cleared by the [place] FDA and has been authorized for detection and/or diagnosis of SARS-CoV-2 by FDA under an Emergency Use Authorization (EUA). This EUA will remain in effect (meaning this test can be used) for the duration of the COVID-19 declaration under Section 564(b)(1) of the Act, 21 U.S.C. section 360bbb-3(b)(1), unless the authorization is terminated or revoked.  Performed at [facility], [address] [place], [place] [address]   Urine Culture  Status: Abnormal   Collection Time: 05/26/21  8:40 PM   Specimen: Urine, Clean Catch  Result Value Ref Range Status   Specimen Description   Final    URINE, CLEAN CATCH Performed at [facility], [address] [place], [place] [address]    Special Requests   Final    NONE Performed at [facility], [address] [place], [place] [address]    Culture >=100,000 COLONIES/mL ESCHERICHIA COLI (A)  Final   Report Status 05/29/2021 FINAL  Final   Organism ID, Bacteria ESCHERICHIA COLI (A)  Final      Susceptibility   Escherichia coli - MIC*    AMPICILLIN >=32 RESISTANT Resistant     CEFAZOLIN <=4 SENSITIVE Sensitive     CEFEPIME <=0.12 SENSITIVE Sensitive     CEFTRIAXONE <=0.25 SENSITIVE Sensitive     CIPROFLOXACIN <=0.25 SENSITIVE Sensitive     GENTAMICIN >=16  RESISTANT Resistant     IMIPENEM <=0.25 SENSITIVE Sensitive     NITROFURANTOIN <=16 SENSITIVE Sensitive     TRIMETH/SULFA >=320 RESISTANT Resistant     AMPICILLIN/SULBACTAM >=32 RESISTANT Resistant     PIP/TAZO <=4 SENSITIVE Sensitive     * >=100,000 COLONIES/mL ESCHERICHIA COLI  Blood culture (routine single)     Status: Abnormal   Collection Time: 05/27/21  9:57 AM   Specimen: BLOOD  Result Value Ref Range Status   Specimen Description   Final    BLOOD BLOOD RIGHT ARM Performed at [facility], [address] [place], [place] [address]    Special Requests   Final    BOTTLES DRAWN AEROBIC AND ANAEROBIC Blood Culture adequate volume Performed at [facility], [address] [place], [place] [address]    Culture  Setup Time   Final    GRAM NEGATIVE RODS IN BOTH AEROBIC AND ANAEROBIC BOTTLES CRITICAL RESULT CALLED TO, READ BACK BY AND VERIFIED WITH: NATHAN BELUE @ 2320 05/27/21 LFD Performed at [facility], [address] [place], [place] [address]    Culture ESCHERICHIA COLI (A)  Final   Report Status 05/30/2021 FINAL  Final   Organism ID, Bacteria ESCHERICHIA COLI  Final      Susceptibility   Escherichia coli - MIC*    AMPICILLIN >=32 RESISTANT Resistant     CEFAZOLIN <=4 SENSITIVE Sensitive     CEFEPIME <=0.12 SENSITIVE Sensitive     CEFTAZIDIME <=1 SENSITIVE Sensitive     CEFTRIAXONE <=0.25 SENSITIVE Sensitive     CIPROFLOXACIN <=0.25 SENSITIVE Sensitive     GENTAMICIN >=16 RESISTANT Resistant     IMIPENEM <=0.25 SENSITIVE Sensitive     TRIMETH/SULFA >=320 RESISTANT Resistant     AMPICILLIN/SULBACTAM >=32 RESISTANT Resistant     PIP/TAZO <=4 SENSITIVE Sensitive     * ESCHERICHIA COLI  Blood Culture ID Panel (Reflexed)     Status: Abnormal (Preliminary result)   Collection Time: 05/27/21  9:57 AM  Result Value Ref Range Status   Enterococcus faecalis NOT DETECTED NOT DETECTED Final   Enterococcus Faecium NOT DETECTED NOT DETECTED Final    Listeria monocytogenes NOT DETECTED NOT DETECTED Final   Staphylococcus species NOT DETECTED NOT DETECTED Final   Staphylococcus aureus (BCID) PENDING NOT DETECTED Incomplete   Staphylococcus epidermidis PENDING NOT DETECTED Incomplete   Staphylococcus lugdunensis PENDING NOT DETECTED Incomplete   Streptococcus species NOT DETECTED NOT DETECTED Final   Streptococcus agalactiae NOT DETECTED NOT DETECTED Final   Streptococcus pneumoniae NOT DETECTED NOT DETECTED Final   Streptococcus pyogenes  NOT DETECTED NOT DETECTED Final   A.calcoaceticus-baumannii NOT DETECTED NOT DETECTED Final   Bacteroides fragilis NOT DETECTED NOT DETECTED Final   Enterobacterales DETECTED (A) NOT DETECTED Final    Comment: Enterobacterales represent a large order of gram negative bacteria, not a single organism. CRITICAL RESULT CALLED TO, READ BACK BY AND VERIFIED WITH: NATHAN BELUE @ 2320 05/27/21 LFD    Enterobacter cloacae complex NOT DETECTED NOT DETECTED Final   Escherichia coli DETECTED (A) NOT DETECTED Final    Comment: CRITICAL RESULT CALLED TO, READ BACK BY AND VERIFIED WITH: NATHAN BELUE @ 2320 05/27/21 LFD     Klebsiella aerogenes NOT DETECTED NOT DETECTED Final   Klebsiella oxytoca NOT DETECTED NOT DETECTED Final   Klebsiella pneumoniae NOT DETECTED NOT DETECTED Final   Proteus species NOT DETECTED NOT DETECTED Final   Salmonella species NOT DETECTED NOT DETECTED Final   Serratia marcescens NOT DETECTED NOT DETECTED Final   Haemophilus influenzae NOT DETECTED NOT DETECTED Final   Neisseria meningitidis NOT DETECTED NOT DETECTED Final   Pseudomonas aeruginosa NOT DETECTED NOT DETECTED Final   Stenotrophomonas maltophilia NOT DETECTED NOT DETECTED Final   Candida albicans NOT DETECTED NOT DETECTED Final   Candida auris NOT DETECTED NOT DETECTED Final   Candida glabrata NOT DETECTED NOT DETECTED Final   Candida krusei NOT DETECTED NOT DETECTED Final   Candida parapsilosis NOT DETECTED NOT DETECTED  Final   Candida tropicalis NOT DETECTED NOT DETECTED Final   Cryptococcus neoformans/gattii NOT DETECTED NOT DETECTED Final   CTX-M ESBL NOT DETECTED NOT DETECTED Final   Carbapenem resistance IMP NOT DETECTED NOT DETECTED Final   Carbapenem resistance KPC NOT DETECTED NOT DETECTED Final   Carbapenem resistance NDM NOT DETECTED NOT DETECTED Final   Carbapenem resist OXA 48 LIKE NOT DETECTED NOT DETECTED Final   Carbapenem resistance VIM NOT DETECTED NOT DETECTED Final    Comment: Performed at [facility], [address] [place], [place] [address]  CULTURE, BLOOD (ROUTINE X 2) w Reflex to ID Panel     Status: None   Collection Time: 05/31/21 11:08 AM   Specimen: BLOOD  Result Value Ref Range Status   Specimen Description BLOOD  BRH  Final   Special Requests BOTTLES DRAWN AEROBIC ONLY  BCAV  Final   Culture   Final    NO GROWTH 5 DAYS Performed at [facility], [address] [place], [place] [address]    Report Status 06/05/2021 FINAL  Final  CULTURE, BLOOD (ROUTINE X 2) w Reflex to ID Panel     Status: None   Collection Time: 05/31/21  3:34 PM   Specimen: BLOOD  Result Value Ref Range Status   Specimen Description BLOOD BLOOD RIGHT HAND  Final   Special Requests   Final    BOTTLES DRAWN AEROBIC AND ANAEROBIC Blood Culture adequate volume   Culture   Final    NO GROWTH 5 DAYS Performed at [facility], [address] [place], [place] [address]    Report Status 06/05/2021 FINAL  Final  Culture, blood (routine x 2)     Status: None (Preliminary result)   Collection Time: 06/01/21  7:50 PM   Specimen: BLOOD  Result Value Ref Range Status   Specimen Description BLOOD BLOOD RIGHT WRIST  Final   Special Requests   Final    BOTTLES DRAWN AEROBIC AND ANAEROBIC Blood Culture results may not be optimal due to an inadequate volume of blood received in culture bottles  Culture   Final    NO GROWTH 4 DAYS Performed at [facility], [address] [place], [place] [address]    Report Status PENDING  Incomplete  Culture, blood (routine x 2)     Status: None (Preliminary result)   Collection Time: 06/01/21 10:36 PM   Specimen: BLOOD  Result Value Ref Range Status   Specimen Description BLOOD LEFT HAND  Final   Special Requests   Final    BOTTLES DRAWN AEROBIC AND ANAEROBIC Blood Culture results may not be optimal due to an inadequate volume of blood received in culture bottles   Culture   Final    NO GROWTH 4 DAYS Performed at [facility], [address], [place], [place] [address]    Report Status PENDING  Incomplete  Urine Culture     Status: None   Collection Time: 06/02/21 12:18 AM   Specimen: Urine, Random  Result Value Ref Range Status   Specimen Description   Final    URINE, RANDOM Performed at [facility], [address], [place], [place] [address]    Special Requests   Final    NONE Performed at [facility], [address] [place], [place] [address]    Culture   Final    NO GROWTH Performed at [facility], [address], [place], [place] [address]    Report Status 06/03/2021 FINAL  Final  Resp Panel by RT-PCR (Flu A&B, Covid) Urine, Clean Catch     Status: None   Collection Time: 06/02/21 12:18 AM   Specimen: Urine, Clean Catch; Nasopharyngeal(NP) swabs in vial transport medium  Result Value Ref Range Status   SARS Coronavirus 2 by RT PCR NEGATIVE NEGATIVE Final    Comment: (NOTE) SARS-CoV-2 target nucleic acids are NOT DETECTED.  The SARS-CoV-2 RNA is generally detectable in upper respiratory specimens during the acute phase of infection. The lowest concentration of SARS-CoV-2 viral copies this assay can detect is 138 copies/mL. A negative result does not preclude SARS-Cov-2 infection and should not be used as the sole basis for treatment or other patient management decisions. A negative result may occur with  improper specimen collection/handling, submission  of specimen other than nasopharyngeal swab, presence of viral mutation(s) within the areas targeted by this assay, and inadequate number of viral copies(<138 copies/mL). A negative result must be combined with clinical observations, patient history, and epidemiological information. The expected result is Negative.  Fact Sheet for Patients:  BloggerCourse.com  Fact Sheet for Healthcare Providers:  SeriousBroker.it  This test is no t yet approved or cleared by the [place] FDA and  has been authorized for detection and/or diagnosis of SARS-CoV-2 by FDA under an Emergency Use Authorization (EUA). This EUA will remain  in effect (meaning this test can be used) for the duration of the COVID-19 declaration under Section 564(b)(1) of the Act, 21 U.S.C.section 360bbb-3(b)(1), unless the authorization is terminated  or revoked sooner.       Influenza A by PCR NEGATIVE NEGATIVE Final   Influenza B by PCR NEGATIVE NEGATIVE Final    Comment: (NOTE) The Xpert Xpress SARS-CoV-2/FLU/RSV plus assay is intended as an aid in the diagnosis of influenza from Nasopharyngeal swab specimens and should not be used as a sole basis for treatment. Nasal washings and aspirates are unacceptable for Xpert Xpress SARS-CoV-2/FLU/RSV testing.  Fact Sheet for Patients: BloggerCourse.com  Fact Sheet for Healthcare Providers: SeriousBroker.it  This test is not yet approved or cleared  by the [place] and has been authorized for detection and/or diagnosis of SARS-CoV-2 by FDA under an Emergency Use Authorization (EUA). This EUA will remain in effect (meaning this test can be used) for the duration of the COVID-19 declaration under Section 564(b)(1) of the Act, 21 U.S.C. section 360bbb-3(b)(1), unless the authorization is terminated or revoked.  Performed at [facility], [address] [place], [place] [address]      Radiology Studies: No results found. CT HEAD WO CONTRAST ( )  Final Result    CT Cervical Spine Wo Contrast  Final Result    CT Maxillofacial Wo Contrast  Final Result    DG Chest 1 View  Final Result      Scheduled Meds:  cephALEXin  500 mg Oral QID   divalproex  500 mg Oral BID   DULoxetine  30 mg Oral Daily   DULoxetine  60 mg Oral Daily   PRN Meds: acetaminophen **OR** acetaminophen, hydrOXYzine, loperamide, ondansetron **OR** ondansetron (ZOFRAN) IV Continuous Infusions:     LOS: 2 days  Time spent: Greater than 50% of the 35 minute visit was spent in counseling/coordination of care for the patient as laid out in the A&P.   Lewie Chamber, MD [facility] 06/05/2021, 1:19 PM

## 2021-06-05 NOTE — Progress Notes (Signed)
PT Cancellation Note  Patient Details Name: Sabrina Johnston MRN: [id number] DOB: Oct 09, 1956   Cancelled Treatment:    Reason Eval/Treat Not Completed: Other (comment) Attempted to see pt for PT session but pt sleeping soundly in bed, unable to awaken. Will re-attempt as able.  [name], PT, DPT 06/05/21, 1:44 PM    [name] 06/05/2021, 1:44 PM

## 2021-06-05 NOTE — Assessment & Plan Note (Signed)
-   Pressure blister noted on patient's right hand on admission mostly involving first digit but now involving some of the third digit.  Expect these to rupture spontaneously and the first digit already has - Try to offload weight from extremities; patient appears to like to lay on her right arm especially in a severely contracted state - Continue Xeroform dressing to ruptured sites and cover with gauze to prevent her from trying to eat the dressings

## 2021-06-06 LAB — CULTURE, BLOOD (ROUTINE X 2)
Culture: NO GROWTH
Culture: NO GROWTH

## 2021-06-06 LAB — T3: T3, Total: 120 ng/dL (ref 71–180)

## 2021-06-06 MED ORDER — HALOPERIDOL LACTATE 5 MG/ML IJ SOLN
5.0000 mg | INTRAMUSCULAR | Status: DC | PRN
  Administered 2021-06-06 – 2021-06-08 (×8): 5 mg via INTRAMUSCULAR
  Filled 2021-06-06 (×8): qty 1

## 2021-06-06 NOTE — Progress Notes (Signed)
   06/06/21 1739 06/06/21 1755  What Happened  Was fall witnessed?  --  Yes  Who witnessed fall?  --  Bonnielee Haff  Patients activity before fall  --  other (comment) (in bed)  Point of contact  --  buttocks  Was patient injured?  --  No  Follow Up  MD notified  --  Girguis  Time MD notified  --  1740  Family notified  --  Yes - comment (legal guardian, [name], notified)  Time family notified  --  1800  Additional tests  --  No  Progress note created (see row info)  --  Yes  Adult Fall Risk Assessment  Risk Factor Category (scoring not indicated)  --  High fall risk per protocol (document High fall risk);Fall has occurred during this admission (document High fall risk);History of more than one fall within 6 months before admission (document High fall risk)  Patient Fall Risk Level  --  High fall risk  Adult Fall Risk Interventions  Required Bundle Interventions *See Row Information*  --  High fall risk - low, moderate, and high requirements implemented  Additional Interventions  --  Use of appropriate toileting equipment (bedpan, BSC, etc.);Safety Sitter/Safety Rounder;Room near nurses station;Reorient/diversional activities with confused patients;PT/OT need assessed if change in mobility from baseline;HeadStart bed sensor (education/return demonstration);HeadStart chair sensor (education/return demonstration)  Screening for Fall Injury Risk (To be completed on HIGH fall risk patients) - Assessing Need for Floor Mats  Risk For Fall Injury- Criteria for Floor Mats  --  Noncompliant with safety precautions;Confusion/dementia (+NuDESC, CIWA, TBI, etc.);Admitted as a result of a fall;Previous fall this admission  Will Implement Floor Mats  --  Yes  Vitals  Temp 97.8 F (36.6 C)  --   Temp Source Oral  --   BP 140/63  --   MAP (mmHg) 85  --   BP Location Left Leg  --   BP Method Automatic  --   Patient Position (if appropriate) Lying  --   Pulse Rate (!) 105  --   Resp 20  --    Oxygen Therapy  SpO2 100 %  --   O2 Device Room Air  --   PAINAD (Pain Assessment in Advanced Dementia)  Breathing  --  0  Negative Vocalization  --  0  Facial Expression  --  0  Body Language  --  1  Consolability  --  0  PAINAD Score  --  1  Neurological  Neuro (WDL)  --  X (reassessed, no changes noted)  Glasgow Coma Scale  Eye Opening  --  4  Best Verbal Response (NON-intubated)  --  1  Best Motor Response  --  4  Glasgow Coma Scale Score  --  9  Musculoskeletal  Musculoskeletal (WDL)  --  X (reassessed, no changes noted)  Integumentary  Integumentary (WDL)  --  X (reassessed, no changes noted)

## 2021-06-06 NOTE — Progress Notes (Signed)
PT Cancellation Note  Patient Details Name: Sabrina Johnston MRN: [id number] DOB: March 30, 1956   Cancelled Treatment:    Reason Eval/Treat Not Completed: Other (comment) Entered room, pt sleeping oriented with head toward foot of bed, fetal position.  Sitter present and reports that she only just recently has been calm.  Per chart review pt did do some walking (~20 ft) with OT earlier today.  Given her propensity for erratic behavior, difficulty to direct, self harm, etc and that she is currently resting tcalmly his Thereasa Parkin made that decision to allow her to continue to rest.  Will maintain on caseload to continue trial assessment for PT and treat as able/appropriate.    Malachi Pro, DPT 06/06/2021, 5:05 PM

## 2021-06-06 NOTE — Progress Notes (Signed)
Progress Note    Sabrina Johnston   NHA:[id number]  DOB: 06-15-1956  DOA: 06/01/2021     3  PCP: Lauro Regulus, MD  Initial CC: agitation   Hospital Course: Sabrina Johnston is a 65 y.o. female with medical history significant for cognitive and neurobehavioral dysfunction, seizure disorder and chronic constipation, who presented to the emergency room 3 hours after her discharge for evaluation of multiple falls and agitation.   She was hospitalized for 6 days and treated for E. coli sepsis with IV antibiotics. 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 She was seen in consultation by neurology and had an EEG which did not show any seizure activity even the patient required 3 mg of IV Ativan for agitation prior to getting the EEG done. Patient was sent back to the emergency room for evaluation from the group home because of aggressive behavior and agitation.  She has sustained 3 falls and struck her head but there was no loss of consciousness or seizure-like activity. Labs in the emergency room showed a lactic acid of 5.6 which has since normalized. I am unable to do a review of systems on this patient since she is sedated and nonverbal. Labs show sodium 143, potassium 4.0, chloride 110, bicarb 23, glucose 83, BUN less than 5, creatinine 0.79, calcium 8.2, alkaline phosphatase 142, albumin 2.0, AST 74, ALT 32, total protein 5.2, ammonia 44, total CK2 52, lactic acid 5. >> 0.9, white count 9.6, hemoglobin 11.5, hematocrit 32.5, MCV 104.5, RDW 14.7, platelet count 236, PT 13.9, INR 1.1, TSH 9.09 Respiratory viral panel is negative Chest x-ray reviewed by me shows low lung volumes with no active disease. CT scan of the head without contrast/maxillofacial CT/CT scan of cervical spine shows  markedly enlarged lateral ventricles with colpocephaly configuration, likely congenital.  Small frontal scalp hematoma without skull fracture.  No facial fracture.  No acute fracture or static subluxation of the cervical spine. Twelve-lead EKG reviewed by me shows normal sinus rhythm with nonspecific ST changes.   ED Course: Patient is a 65 year old female with a history of cognitive and neurobehavioral dysfunction who resides in a group home and was discharged from the hospital on 06/01/21 after hospitalization for E. coli sepsis and metabolic encephalopathy. Patient returned to the emergency room 3 hours after her discharge for evaluation of multiple witnessed falls as well as aggressive behavior towards the staff. Imaging shows a right frontal hematoma and her initial lactic acid level was elevated at 5.6. She required sedation to get a CT scan of her head, cervical spine and maxillofacial CT to rule out any injuries and remains sedated. She was on a Lawyer transiently for hypothermia which has resolved.  Interval History:  No events overnight.  Awake and looking at me today. Sitter now bedside due to patient trying to get out of bed.   ROS: Review of systems not obtained due to patient factors.  Cognitive impairment  Assessment & Plan: * Acute metabolic encephalopathy-resolved as of 06/06/2021 - initially had some agitation in the ER on admission; after discussing with her caretaker bedside on 06/03/2021, she has not far off of baseline.  She is noted to often hit her head with her hands at times and due to being nonverbal has difficulty communicating and gets agitated easily.  We  also discussed that her underlying neurocognitive dysfunction may simply be progressive at this point -Patient was resting comfortably in bed asleep as she has been doing more often lately this morning when caretaker was bedside -She appears to be close to her baseline or new baseline  Abnormal TSH - H&P  mentions hypothyroidism but do not see this in PMH nor med on her med list - TSH has been recently elevated - Most recent TSH is 9.096 on 06/01/2021 - Check free T4 (elevated 1.5) and total T3 (120) -Her underlying diagnosis has been unable to be clarified from group home - since levels elevated will hold off on med for now - repeat thyroid studies in 4-6 weeks; if still abnormal, can undergo further testing  Agitation-resolved as of 06/05/2021 - Intermittent and may be due to her underlying cognitive disorders.  No acute etiology to explain otherwise at this time -Has been calm and cooperative since 06/04/2021  Bacteremia due to Gram-negative bacteria - Discharged on 06/01/2021 and was treated with Rocephin.  Repeat cultures were also negative from 05/31/2021.  She was discharged on 7 more days of Keflex 500 mg 4 times daily -IV access was lost.  Transitioning her back to Keflex (end date 8/25) from Rocephin -Okay to leave IV out  Severe intellectual disability - Patient has significant neurocognitive dysfunction and developmental disorder.  Was able to ambulate prior to hospitalizations but recently now is eating less and not walking anymore; caretaker states this is different for her. I discussed that at her age with her diagnoses it's not unreasonable that she may be starting to decline (as we would see with a typical end stage dementia patient).  - not much further to offer at this time other than supportive care -Group home is asking for evaluation for possible SNF.  Patient may not be rehabable at this point but we will attempt. Otherwise this may be her new baseline and as I mentioned prior, her underlying neurocog impairments may simply be progressing  Pressure ulcer with abrasion, blister, partial thickness skin loss involving epidermis and/or dermis (HCC) - Pressure blister noted on patient's right hand on admission mostly involving first digit but now involving some of the third digit.   Expect these to rupture spontaneously and the first digit already has - Try to offload weight from extremities; patient appears to like to lay on her right arm especially in a severely contracted state - Continue Xeroform dressing to ruptured sites and cover with gauze to prevent her from trying to eat the dressings  Generalized epilepsy (HCC) - Valproic acid normal - Continue Depakote   Old records reviewed in assessment of this patient  Antimicrobials: Rocephin 06/02/2021 >> 8/21 Keflex 8/21 >> current   DVT prophylaxis:    Code Status:   Code Status: Full Code Family Communication: Caretaker  Disposition Plan: Status is: Inpatient  Remains inpatient appropriate because:Altered mental status, IV treatments appropriate due to intensity of illness or inability to take PO, and Inpatient level of care appropriate due to severity of illness  Dispo: The patient is from:  [facility]              Anticipated d/c is to: Pending further PT/OT assessments              Patient currently is medically stable to d/c.   Difficult to place patient No Risk of unplanned readmission score: Unplanned Admission- Pilot do not use: 11.96   Objective: Blood pressure 112/66, pulse  95, temperature 97.6 F (36.4 C), resp. rate 18, height 5' (1.524 m), weight 61.2 kg, SpO2 99 %.  Examination: General appearance:  chronically ill appearing; laying in bed now more awake and alert  Head:  asymmetric head Eyes:  pupils equally round Lungs: clear to auscultation bilaterally Heart: regular rate and rhythm and S1, S2 normal Abdomen: normal findings: bowel sounds normal and soft, non-tender Extremities:  right had 1st digit bulla is now ruptured and covered with dressing; contracted LUE Skin: normal Neurologic: unable to fully assess; nonverbal. Does not follow commands  Consultants:    Procedures:    Data Reviewed: I have personally reviewed following labs and imaging studies No  results found for this or any previous visit (from the past 24 hour(s)).   Recent Results (from the past 240 hour(s))  CULTURE, BLOOD (ROUTINE X 2) w Reflex to ID Panel     Status: None   Collection Time: 05/31/21 11:08 AM   Specimen: BLOOD  Result Value Ref Range Status   Specimen Description BLOOD  BR[facility]  Final   Special Requests BOTTLES DRAWN AEROBIC ONLY  BCAV  Final   Culture   Final    NO GROWTH 5 DAYS Performed at [facility], [address] [place], [place] [address]    Report Status 06/05/2021 FINAL  Final  CULTURE, BLOOD (ROUTINE X 2) w Reflex to ID Panel     Status: None   Collection Time: 05/31/21  3:34 PM   Specimen: BLOOD  Result Value Ref Range Status   Specimen Description BLOOD BLOOD RIGHT HAND  Final   Special Requests   Final    BOTTLES DRAWN AEROBIC AND ANAEROBIC Blood Culture adequate volume   Culture   Final    NO GROWTH 5 DAYS Performed at [facility], [address] [place], [place] [address]    Report Status 06/05/2021 FINAL  Final  Culture, blood (routine x 2)     Status: None   Collection Time: 06/01/21  7:50 PM   Specimen: BLOOD  Result Value Ref Range Status   Specimen Description BLOOD BLOOD RIGHT WRIST  Final   Special Requests   Final    BOTTLES DRAWN AEROBIC AND ANAEROBIC Blood Culture results may not be optimal due to an inadequate volume of blood received in culture bottles   Culture   Final    NO GROWTH 5 DAYS Performed at [facility], [address] [place], [place] [address]    Report Status 06/06/2021 FINAL  Final  Culture, blood (routine x 2)     Status: None   Collection Time: 06/01/21 10:36 PM   Specimen: BLOOD  Result Value Ref Range Status   Specimen Description BLOOD LEFT HAND  Final   Special Requests   Final    BOTTLES DRAWN AEROBIC AND ANAEROBIC Blood Culture results may not be optimal due to an inadequate volume of blood received in culture bottles   Culture   Final    NO GROWTH 5  DAYS Performed at [facility], [address] [place], [place] [address]    Report Status 06/06/2021 FINAL  Final  Urine Culture     Status: None   Collection Time: 06/02/21 12:18 AM   Specimen: Urine, Random  Result Value Ref Range Status   Specimen Description   Final    URINE, RANDOM Performed at [facility], [address], [place], [place] [address]    Special Requests   Final  NONE Performed at [facility], [address] [place], [place] [address]    Culture   Final    NO GROWTH Performed at [facility], [address] [place], [place] [address]    Report Status 06/03/2021 FINAL  Final  Resp Panel by RT-PCR (Flu A&B, Covid) Urine, Clean Catch     Status: None   Collection Time: 06/02/21 12:18 AM   Specimen: Urine, Clean Catch; Nasopharyngeal(NP) swabs in vial transport medium  Result Value Ref Range Status   SARS Coronavirus 2 by RT PCR NEGATIVE NEGATIVE Final    Comment: (NOTE) SARS-CoV-2 target nucleic acids are NOT DETECTED.  The SARS-CoV-2 RNA is generally detectable in upper respiratory specimens during the acute phase of infection. The lowest concentration of SARS-CoV-2 viral copies this assay can detect is 138 copies/mL. A negative result does not preclude SARS-Cov-2 infection and should not be used as the sole basis for treatment or other patient management decisions. A negative result may occur with  improper specimen collection/handling, submission of specimen other than nasopharyngeal swab, presence of viral mutation(s) within the areas targeted by this assay, and inadequate number of viral copies(<138 copies/mL). A negative result must be combined with clinical observations, patient history, and epidemiological information. The expected result is Negative.  Fact Sheet for Patients:  BloggerCourse.comhttps://www.fda.gov/media/152166/download  Fact Sheet for Healthcare Providers:   SeriousBroker.ithttps://www.fda.gov/media/152162/download  This test is no t yet approved or cleared by the [place] FDA and  has been authorized for detection and/or diagnosis of SARS-CoV-2 by FDA under an Emergency Use Authorization (EUA). This EUA will remain  in effect (meaning this test can be used) for the duration of the COVID-19 declaration under Section 564(b)(1) of the Act, 21 U.S.C.section 360bbb-3(b)(1), unless the authorization is terminated  or revoked sooner.       Influenza A by PCR NEGATIVE NEGATIVE Final   Influenza B by PCR NEGATIVE NEGATIVE Final    Comment: (NOTE) The Xpert Xpress SARS-CoV-2/FLU/RSV plus assay is intended as an aid in the diagnosis of influenza from Nasopharyngeal swab specimens and should not be used as a sole basis for treatment. Nasal washings and aspirates are unacceptable for Xpert Xpress SARS-CoV-2/FLU/RSV testing.  Fact Sheet for Patients: BloggerCourse.comhttps://www.fda.gov/media/152166/download  Fact Sheet for Healthcare Providers: SeriousBroker.ithttps://www.fda.gov/media/152162/download  This test is not yet approved or cleared by the [place] FDA and has been authorized for detection and/or diagnosis of SARS-CoV-2 by FDA under an Emergency Use Authorization (EUA). This EUA will remain in effect (meaning this test can be used) for the duration of the COVID-19 declaration under Section 564(b)(1) of the Act, 21 U.S.C. section 360bbb-3(b)(1), unless the authorization is terminated or revoked.  Performed at [facility], [address] [place], [place] [address]      Radiology Studies: No results found. CT HEAD WO CONTRAST (5MM)  Final Result    CT Cervical Spine Wo Contrast  Final Result    CT Maxillofacial Wo Contrast  Final Result    DG Chest 1 View  Final Result      Scheduled Meds:  cephALEXin  500 mg Oral QID   divalproex  500 mg Oral BID   DULoxetine  30 mg Oral Daily   DULoxetine  60 mg Oral Daily   PRN Meds: acetaminophen  **OR** acetaminophen, hydrOXYzine, loperamide, ondansetron **OR** ondansetron (ZOFRAN) IV Continuous Infusions:     LOS: 3 days  Time spent: Greater than 50% of the 35 minute visit was spent in counseling/coordination of care  for the patient as laid out in the A&P.   Lewie Chamber, MD [facility] 06/06/2021, 1:32 PM

## 2021-06-06 NOTE — Evaluation (Signed)
Occupational Therapy Evaluation Patient Details Name: Sabrina Johnston MRN: [id number] DOB: [birth year] Today's Date: 06/06/2021    History of Present Illness Pt is a 65 y/o F admitted on 06/01/21 from a group home with c/o aggressive behavior & agitation. CT revealed small frontal scalp hematoma without skull fx. Pt was recently discharged 3 hours prior. She was previously evaluated for multiple falls & agitation where she was hospitalized for 6 days & treated for E. coli sepsis. 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 PMH: cognitive & neurobehavioral dysfunction, seizure disorder, chronic constipation   Clinical Impression   Patient presenting with decreased I in self care, balance, functional mobility/transfers, endurance, and safety awareness. Patient is non verbal at baseline and living in group home. Per chart review, staff at group home report pt is normally pleasant and ambulates at baseline. She does sleep quite and bit but they wake her for meals and activities. She does have self injurious behaviors at baseline. Patient is impulsive and requires mod - max multimodal cuing for technique and safety. She is motivated with toys and noise makers but does need manual facilitation to return to bed. Pt ambulates 20' in room with min HHA. Patient will benefit from acute OT to increase overall independence in the areas of ADLs, functional mobility, and safety awareness in order to safely discharge likely to group home with 24/7 supervision/assist for safety.    Follow Up Recommendations  Supervision/Assistance - 24 hour    Equipment Recommendations  None recommended by OT       Precautions / Restrictions Precautions Precautions: Fall Restrictions Weight Bearing Restrictions: No      Mobility Bed Mobility Overal bed mobility: Needs Assistance Bed Mobility: Supine to Sit;Sit to Supine     Supine to sit: Min assist Sit to supine: Min assist   General bed  mobility comments: Pt impulsive with attempts to get out of bed before therapist sets up bed for safety. Pt needing mod - max multimodal cuing for safety.    Transfers Overall transfer level: Needs assistance Equipment used: 1 person hand held assist Transfers: Sit to/from [employer] Sit to Stand: Min assist Stand pivot transfers: Mod assist       General transfer comment: Pt needing manual facilitation for pivot transfer. Pt at times happy to just stand in room with therapist. Use of toys/noise makers to direct pt back to bed.    Balance Overall balance assessment: Needs assistance Sitting-balance support: Feet supported Sitting balance-Leahy Scale: Fair     Standing balance support: Single extremity supported;During functional activity Standing balance-Leahy Scale: Poor                             ADL either performed or assessed with clinical judgement   ADL Overall ADL's : Needs assistance/impaired                                       General ADL Comments: Pt needing max A for self care tasks. She will attempt self feeding with finger foods and holding onto "sippy cup" for liquids.     Vision Patient Visual Report: No change from baseline              Pertinent Vitals/Pain Pain Assessment: Faces Faces Pain Scale: No hurt     Hand Dominance Right  Extremity/Trunk Assessment Upper Extremity Assessment Upper Extremity Assessment: Difficult to assess due to impaired cognition;Generalized weakness (Pt has blisters on R digits. She does grip and hold onto toys when handed to her.)   Lower Extremity Assessment Lower Extremity Assessment: Defer to PT evaluation       Communication Communication Communication: Other (comment) (non verbal at baseline.)   Cognition Arousal/Alertness: Awake/alert Behavior During Therapy: Flat affect Overall Cognitive Status: History of cognitive impairments - at baseline                                  General Comments: Pt with intellectual disabilites, poor ability to follow commands, impaired safety awareness, memory. Requires tactile/verbal cuing & at times light manual facilitation (to turn around) to perform functional mobility. Pt does attempt to use things in room for self injurious behavior ( leans towards door to attempt to bang her head on it)              Home Living Family/patient expects to be discharged to:: Group home                                        Prior Functioning/Environment          Comments: Per chart, pt is nonverbal, ambulatory without AD, from a group home. Per staff report at group home, she sleeps most of the day but they wake her for meals and activities. She likes toys that make sounds.        OT Problem List: Decreased strength;Pain;Decreased activity tolerance;Decreased safety awareness;Impaired balance (sitting and/or standing);Decreased knowledge of use of DME or AE;Decreased cognition      OT Treatment/Interventions: Self-care/ADL training;Balance training;Therapeutic exercise;Therapeutic activities;Energy conservation;Cognitive remediation/compensation;DME and/or AE instruction;Patient/family education;Manual therapy;Modalities    OT Goals(Current goals can be found in the care plan section) Acute Rehab OT Goals Patient Stated Goal: none stated OT Goal Formulation: Patient unable to participate in goal setting Time For Goal Achievement: 06/20/21 Potential to Achieve Goals: Fair ADL Goals Pt Will Perform Grooming: with min guard assist Pt Will Perform Lower Body Dressing: with min guard assist;sit to/from stand Pt Will Transfer to Toilet: with min guard assist Pt Will Perform Toileting - Clothing Manipulation and hygiene: with min assist  OT Frequency: Min 2X/week   Barriers to D/C:    none known at this time          AM-PAC OT "6 Clicks" Daily Activity     Outcome Measure Help  from another person eating meals?: A Lot Help from another person taking care of personal grooming?: A Lot Help from another person toileting, which includes using toliet, bedpan, or urinal?: Total Help from another person bathing (including washing, rinsing, drying)?: A Lot Help from another person to put on and taking off regular upper body clothing?: A Lot Help from another person to put on and taking off regular lower body clothing?: Total 6 Click Score: 10   End of Session Nurse Communication: Mobility status  Activity Tolerance: Patient tolerated treatment well Patient left: in bed;with call bell/phone within reach;with bed alarm set;with nursing/sitter in room  OT Visit Diagnosis: Muscle weakness (generalized) (M62.81);Unsteadiness on feet (R26.81)                Time: 7793-9030 OT Time Calculation (min): 19 min Charges:  OT General Charges $  OT Visit: 1 Visit OT Evaluation $OT Eval Moderate Complexity: 1 Mod OT Treatments $Therapeutic Activity: 8-22 mins  Jackquline Denmark, MS, OTR/L , CBIS ascom [phone]  06/06/21, 11:28 AM

## 2021-06-06 NOTE — Progress Notes (Addendum)
Pt has raptured and intact blisters on her bilateral fingers, excoriated arm, legs abdomen and hip, ecchymosis  06/06/21 0300  What Happened  Was fall witnessed? Yes  Who witnessed fall? Rayman Petrosian, RN  Patients activity before fall other (comment) (in bed)  Point of contact hip/leg  Was patient injured? No  Follow Up  MD notified Manuela Schwartz, NP  Time MD notified [phone]  Family notified Yes - comment (But family did not respond when called)  Time family notified 0230  Additional tests No  Progress note created (see row info) Yes  Adult Fall Risk Assessment  Risk Factor Category (scoring not indicated) History of more than one fall within 6 months before admission (document High fall risk);Fall has occurred during this admission (document High fall risk)  Patient Fall Risk Level High fall risk  Adult Fall Risk Interventions  Required Bundle Interventions *See Row Information* High fall risk - low, moderate, and high requirements implemented  Additional Interventions Safety Sitter/Safety Rounder;Room near nurses station  Screening for Fall Injury Risk (To be completed on HIGH fall risk patients) - Assessing Need for Floor Mats  Risk For Fall Injury- Criteria for Floor Mats Confusion/dementia (+NuDESC, CIWA, TBI, etc.);Admitted as a result of a fall  Will Implement Floor Mats Yes  Vitals  Temp 97.8 F (36.6 C)  Temp Source Axillary  BP 121/65  BP Location Right Leg  BP Method Automatic  Patient Position (if appropriate) Lying  Pulse Rate 95  Resp 16  Oxygen Therapy  SpO2 92 %  O2 Device Room Air  Pain Assessment  Pain Scale Faces  Faces Pain Scale 0  PCA/Epidural/Spinal Assessment  Respiratory Pattern Regular;Unlabored  Neurological  Neuro (WDL) X  Level of Consciousness Alert  Orientation Level Other (comment) (pt is non verbal)  Cognition Developmentally delayed  Speech Mute  Pupil Assessment  No  Neuro Symptoms Agitation;Anxiety  Neuro symptoms relieved by  Music;Rest;Anti-anxiety medication  Musculoskeletal  Musculoskeletal (WDL) X  Assistive Device None  Generalized Weakness Yes  Weight Bearing Restrictions No  Musculoskeletal Details  RUE Full movement  LUE Full movement  RLE Weakness  LLE Weakness

## 2021-06-07 DIAGNOSIS — G40309 Generalized idiopathic epilepsy and epileptic syndromes, not intractable, without status epilepticus: Secondary | ICD-10-CM

## 2021-06-07 MED ORDER — ENOXAPARIN SODIUM 40 MG/0.4ML IJ SOSY
40.0000 mg | PREFILLED_SYRINGE | INTRAMUSCULAR | Status: DC
  Administered 2021-06-07 – 2021-08-06 (×60): 40 mg via SUBCUTANEOUS
  Filled 2021-06-07 (×61): qty 0.4

## 2021-06-07 MED ORDER — TRAMADOL HCL 50 MG PO TABS
50.0000 mg | ORAL_TABLET | Freq: Four times a day (QID) | ORAL | Status: DC | PRN
  Administered 2021-06-07 – 2021-06-11 (×3): 50 mg via ORAL
  Filled 2021-06-07 (×3): qty 1

## 2021-06-07 MED ORDER — OXYCODONE-ACETAMINOPHEN 5-325 MG PO TABS
1.0000 | ORAL_TABLET | Freq: Four times a day (QID) | ORAL | Status: DC | PRN
Start: 2021-06-07 — End: 2021-06-16
  Administered 2021-06-07 – 2021-06-16 (×20): 1 via ORAL
  Filled 2021-06-07 (×21): qty 1

## 2021-06-07 NOTE — TOC Progression Note (Signed)
Transition of Care [facility]) - Progression Note    Patient Details  Name: Sabrina Johnston MRN: [id number] Date of Birth: 1956-06-28  [facility] of [facility]) CM/SW Contact  Caryn Section, RN Phone Number: 06/07/2021, 10:42 AM  Clinical Narrative:   Patient resides at [facility] but they will not take her back without rehab first.  Sister initially said patient can return, however she stated she would get back to [facility] with final decision.  RNCM called sister today, and her phone is disconnected.  Anselm Pancoast states they do not have an updated phone number for her.    TOC to research other numbers and attempt LTC placement if appropriate.         Expected Discharge Plan and Services                                                 Social Determinants of Health (SDOH) Interventions    Readmission Risk Interventions Readmission Risk Prevention Plan 06/06/2021  Post Dischage Appt Complete  Medication Screening Complete  Transportation Screening Complete  Some recent data might be hidden

## 2021-06-07 NOTE — Progress Notes (Signed)
PROGRESS NOTE    Sabrina Johnston  RCV:[id number] DOB: 01/27/1956 DOA: 06/01/2021 PCP: Lauro Regulus, MD   Assessment & Plan:   Active Problems:   Generalized epilepsy (HCC)   Severe intellectual disability   Bacteremia due to Gram-negative bacteria   Abnormal TSH   Pressure ulcer with abrasion, blister, partial thickness skin loss involving epidermis and/or dermis (HCC)  Acute metabolic encephalopathy: back to baseline   Abnormal TSH: recommend repeat thyroid studies in 4-6 weeks. Unclear previous hx of possible thyroid disorders  Bacteremia: secondary to gram neg bacteria. Discharged on 06/01/2021 and was treated with Rocephin.  Repeat cultures were also negative. Was discharged on 7 more days of Keflex 500 mg 4 times daily.  Continue on keflex w/ end date of 06/08/21   Severe intellectual disability: has neurocognitive dysfunction & developmental disorder. Recently eating less and not walking anymore, possibly declining. Continue w/ supportive care.   Pressure ulcer: of right hand which was present on admission. Involving first and third digit. Continue Xeroform dressing to ruptured sites and cover with gauze to prevent her from trying to eat the dressings  Generalized epilepsy: continue on home dose of depakote   DVT prophylaxis: SCDs  Code Status: full  Family Communication: Disposition Plan: unclear  Level of care: Med-Surg  Status is: Inpatient  Remains inpatient appropriate because:Unsafe d/c plan  Dispo: The patient is from: Group home              Anticipated d/c is to:  unclear              Patient currently is medically stable to d/c.   Difficult to place patient Yes    Consultants:    Procedures:   Antimicrobials: keflex   Subjective: Pt is unable to communicate any complaints   Objective: Vitals:   06/06/21 1739 06/06/21 2100 06/07/21 0105 06/07/21 0619  BP: 140/63 105/71 (!) 118/57 (!) 91/58  Pulse: (!) 105 94 85 82  Resp: 20 18 16 16    Temp: 97.8 F (36.6 C) 97.6 F (36.4 C) (!) 97.4 F (36.3 C) 98.2 F (36.8 C)  TempSrc: Oral Axillary  Oral  SpO2: 100% 98% 100% 100%  Weight:      Height:        Intake/Output Summary (Last 24 hours) at 06/07/2021 0736 Last data filed at 06/06/2021 2300 Gross per 24 hour  Intake 960 ml  Output --  Net 960 ml   Filed Weights   06/01/21 1856  Weight: 61.2 kg    Examination:  General exam: Appears agitated  Respiratory system: Clear to auscultation. Respiratory effort normal. Cardiovascular system: S1 & S2 +. No rubs, gallops or clicks.  Gastrointestinal system: Abdomen is nondistended, soft and nontender. Normal bowel sounds heard. Central nervous system: Alert and awake. Moves all extremities Skin: bruises over b/l UE and b/l LE  Psychiatry: Judgement and insight appear abnormal. Appears agitated     Data Reviewed: I have personally reviewed following labs and imaging studies  CBC: Recent Labs  Lab 05/31/21 1057 06/01/21 0553 06/01/21 1950 06/03/21 0657  WBC 7.2 7.1 9.6 10.8*  NEUTROABS  --   --  4.3  --   HGB 11.8* 11.6* 11.5* 11.7*  HCT 32.9* 33.5* 32.5* 32.8*  MCV 104.4* 103.1* 104.5* 104.8*  PLT 178 177 236 249   Basic Metabolic Panel: Recent Labs  Lab 05/31/21 1057 06/01/21 0553 06/01/21 1005 06/01/21 1950 06/03/21 0657  NA 144 142  --  143 142  K 2.8* 4.2 4.1 4.0 3.6  CL 109 108  --  110 106  CO2 24 25  --  23 25  GLUCOSE 80 116*  --  83 88  BUN 10 6*  --  <5* <5*  CREATININE 0.58 0.56  --  0.79 0.62  CALCIUM 7.7* 7.7*  --  8.2* 8.5*  MG 1.9 1.9  --   --   --   PHOS 3.5 2.1*  --   --   --    GFR: Estimated Creatinine Clearance: 58.9 mL/min (by C-G formula based on SCr of 0.62 mg/dL). Liver Function Tests: Recent Labs  Lab 06/01/21 1950  AST 74*  ALT 32  ALKPHOS 142*  BILITOT 0.6  PROT 5.2*  ALBUMIN 2.0*   No results for input(s): LIPASE, AMYLASE in the last 168 hours. Recent Labs  Lab 06/01/21 1950  AMMONIA 44*    Coagulation Profile: Recent Labs  Lab 06/01/21 1950  INR 1.1   Cardiac Enzymes: Recent Labs  Lab 06/01/21 1950  CKTOTAL 252*   BNP (last 3 results) No results for input(s): PROBNP in the last 8760 hours. HbA1C: No results for input(s): HGBA1C in the last 72 hours. CBG: Recent Labs  Lab 06/02/21 0330 06/02/21 1509 06/02/21 2231 06/03/21 2219  GLUCAP 91 89 82 93   Lipid Profile: No results for input(s): CHOL, HDL, LDLCALC, TRIG, CHOLHDL, LDLDIRECT in the last 72 hours. Thyroid Function Tests: No results for input(s): TSH, T4TOTAL, FREET4, T3FREE, THYROIDAB in the last 72 hours. Anemia Panel: No results for input(s): VITAMINB12, FOLATE, FERRITIN, TIBC, IRON, RETICCTPCT in the last 72 hours. Sepsis Labs: Recent Labs  Lab 06/01/21 1950 06/01/21 2211 06/02/21 0018 06/02/21 0705  LATICACIDVEN 5.6* 3.2* 1.7 0.9    Recent Results (from the past 240 hour(s))  CULTURE, BLOOD (ROUTINE X 2) w Reflex to ID Panel     Status: None   Collection Time: 05/31/21 11:08 AM   Specimen: BLOOD  Result Value Ref Range Status   Specimen Description BLOOD  BRH  Final   Special Requests BOTTLES DRAWN AEROBIC ONLY  BCAV  Final   Culture   Final    NO GROWTH 5 DAYS Performed at [facility], [address] [place], [place] [address]    Report Status 06/05/2021 FINAL  Final  CULTURE, BLOOD (ROUTINE X 2) w Reflex to ID Panel     Status: None   Collection Time: 05/31/21  3:34 PM   Specimen: BLOOD  Result Value Ref Range Status   Specimen Description BLOOD BLOOD RIGHT HAND  Final   Special Requests   Final    BOTTLES DRAWN AEROBIC AND ANAEROBIC Blood Culture adequate volume   Culture   Final    NO GROWTH 5 DAYS Performed at [facility], [address] [place], [place] [address]    Report Status 06/05/2021 FINAL  Final  Culture, blood (routine x 2)     Status: None   Collection Time: 06/01/21  7:50 PM   Specimen: BLOOD  Result Value Ref Range Status    Specimen Description BLOOD BLOOD RIGHT WRIST  Final   Special Requests   Final    BOTTLES DRAWN AEROBIC AND ANAEROBIC Blood Culture results may not be optimal due to an inadequate volume of blood received in culture bottles   Culture   Final    NO GROWTH 5 DAYS Performed at [facility], [address] [place], [place] [address]    Report Status 06/06/2021  FINAL  Final  Culture, blood (routine x 2)     Status: None   Collection Time: 06/01/21 10:36 PM   Specimen: BLOOD  Result Value Ref Range Status   Specimen Description BLOOD LEFT HAND  Final   Special Requests   Final    BOTTLES DRAWN AEROBIC AND ANAEROBIC Blood Culture results may not be optimal due to an inadequate volume of blood received in culture bottles   Culture   Final    NO GROWTH 5 DAYS Performed at [facility], [address], [place], [place] [address]    Report Status 06/06/2021 FINAL  Final  Urine Culture     Status: None   Collection Time: 06/02/21 12:18 AM   Specimen: Urine, Random  Result Value Ref Range Status   Specimen Description   Final    URINE, RANDOM Performed at [facility], [address] [place], [place] [address]    Special Requests   Final    NONE Performed at [facility], [address] [place], [place] [address]    Culture   Final    NO GROWTH Performed at [facility], [address], [place], [place] [address]    Report Status 06/03/2021 FINAL  Final  Resp Panel by RT-PCR (Flu A&B, Covid) Urine, Clean Catch     Status: None   Collection Time: 06/02/21 12:18 AM   Specimen: Urine, Clean Catch; Nasopharyngeal(NP) swabs in vial transport medium  Result Value Ref Range Status   SARS Coronavirus 2 by RT PCR NEGATIVE NEGATIVE Final    Comment: (NOTE) SARS-CoV-2 target nucleic acids are NOT DETECTED.  The SARS-CoV-2 RNA is generally detectable in upper respiratory specimens during the acute phase of infection. The lowest concentration of  SARS-CoV-2 viral copies this assay can detect is 138 copies/mL. A negative result does not preclude SARS-Cov-2 infection and should not be used as the sole basis for treatment or other patient management decisions. A negative result may occur with  improper specimen collection/handling, submission of specimen other than nasopharyngeal swab, presence of viral mutation(s) within the areas targeted by this assay, and inadequate number of viral copies(<138 copies/mL). A negative result must be combined with clinical observations, patient history, and epidemiological information. The expected result is Negative.  Fact Sheet for Patients:  BloggerCourse.com  Fact Sheet for Healthcare Providers:  SeriousBroker.it  This test is no t yet approved or cleared by the [place] FDA and  has been authorized for detection and/or diagnosis of SARS-CoV-2 by FDA under an Emergency Use Authorization (EUA). This EUA will remain  in effect (meaning this test can be used) for the duration of the COVID-19 declaration under Section 564(b)(1) of the Act, 21 U.S.C.section 360bbb-3(b)(1), unless the authorization is terminated  or revoked sooner.       Influenza A by PCR NEGATIVE NEGATIVE Final   Influenza B by PCR NEGATIVE NEGATIVE Final    Comment: (NOTE) The Xpert Xpress SARS-CoV-2/FLU/RSV plus assay is intended as an aid in the diagnosis of influenza from Nasopharyngeal swab specimens and should not be used as a sole basis for treatment. Nasal washings and aspirates are unacceptable for Xpert Xpress SARS-CoV-2/FLU/RSV testing.  Fact Sheet for Patients: BloggerCourse.com  Fact Sheet for Healthcare Providers: SeriousBroker.it  This test is not yet approved or cleared by the [place] FDA and has been authorized for detection and/or diagnosis of SARS-CoV-2 by FDA under an Emergency Use  Authorization (EUA). This EUA will remain in effect (  meaning this test can be used) for the duration of the COVID-19 declaration under Section 564(b)(1) of the Act, 21 U.S.C. section 360bbb-3(b)(1), unless the authorization is terminated or revoked.  Performed at [facility], [address] [place], [place] [address]          Radiology Studies: No results found.      Scheduled Meds:  cephALEXin  500 mg Oral QID   divalproex  500 mg Oral BID   DULoxetine  30 mg Oral Daily   DULoxetine  60 mg Oral Daily   Continuous Infusions:   LOS: 4 days    Time spent: 25 mins     Charise Killian, MD [facility] Pager [phone]  If 7PM-7AM, please contact night-coverage 06/07/2021, 7:36 AM

## 2021-06-07 NOTE — Progress Notes (Signed)
Pt pulled vaseline gauze dressing off of finger while trying to wrap it. While doing this she pulled loose skin off of her finger as well. Did cause slight bleeding, but was easily controlled. Dressing replaced at that time.

## 2021-06-07 NOTE — Progress Notes (Signed)
PT Cancellation Note  Patient Details Name: Sabrina Johnston MRN: [id number] DOB: August 29, 1956   Cancelled Treatment:    Reason Eval/Treat Not Completed: Patient's level of consciousness Case discussed with RN and CNA/sitters.  Apparently she was very "active" earlier today with rolling and flopping (nearly out of bed?) etc.  Nurse had to administer Haldol, at this times she was soundly sleeping and did not wake to her name.    Malachi Pro, DPT 06/07/2021, 4:00 PM

## 2021-06-07 NOTE — TOC Progression Note (Signed)
Transition of Care [facility]) - Progression Note    Patient Details  Name: GABRYELLA MURFIN MRN: [id number] Date of Birth: 09-19-1956  Transition of Care [facility]) CM/SW Contact  Caryn Section, RN Phone Number: 06/07/2021, 1:26 PM  Clinical Narrative:   RNCM reached patient's sister [name].  [name] will call facility [name] states patient has Medicaid and Medicare.  Sister will consider LTC placement in the  area, as patient's other sister is local. Linus Mako sister [name] has POA).  [phone].  Bed search started.         Expected Discharge Plan and Services                                                 Social Determinants of Health (SDOH) Interventions    Readmission Risk Interventions Readmission Risk Prevention Plan 06/06/2021  Post Dischage Appt Complete  Medication Screening Complete  Transportation Screening Complete  Some recent data might be hidden

## 2021-06-08 LAB — VITAMIN B12: Vitamin B-12: 870 pg/mL (ref 180–914)

## 2021-06-08 LAB — CBC
HCT: 32.6 % — ABNORMAL LOW (ref 36.0–46.0)
Hemoglobin: 11.1 g/dL — ABNORMAL LOW (ref 12.0–15.0)
MCH: 36.9 pg — ABNORMAL HIGH (ref 26.0–34.0)
MCHC: 34 g/dL (ref 30.0–36.0)
MCV: 108.3 fL — ABNORMAL HIGH (ref 80.0–100.0)
Platelets: 485 10*3/uL — ABNORMAL HIGH (ref 150–400)
RBC: 3.01 MIL/uL — ABNORMAL LOW (ref 3.87–5.11)
RDW: 17.3 % — ABNORMAL HIGH (ref 11.5–15.5)
WBC: 13.5 10*3/uL — ABNORMAL HIGH (ref 4.0–10.5)
nRBC: 0.7 % — ABNORMAL HIGH (ref 0.0–0.2)

## 2021-06-08 LAB — BASIC METABOLIC PANEL
Anion gap: 11 (ref 5–15)
BUN: 11 mg/dL (ref 8–23)
CO2: 29 mmol/L (ref 22–32)
Calcium: 8.4 mg/dL — ABNORMAL LOW (ref 8.9–10.3)
Chloride: 103 mmol/L (ref 98–111)
Creatinine, Ser: 1.03 mg/dL — ABNORMAL HIGH (ref 0.44–1.00)
GFR, Estimated: >60 mL/min (ref >60)
Glucose, Bld: 88 mg/dL (ref 70–99)
Potassium: 4.9 mmol/L (ref 3.5–5.1)
Sodium: 143 mmol/L (ref 135–145)

## 2021-06-08 MED ORDER — ZIPRASIDONE HCL 20 MG PO CAPS
20.0000 mg | ORAL_CAPSULE | Freq: Two times a day (BID) | ORAL | Status: DC
  Administered 2021-06-08 – 2021-07-29 (×102): 20 mg via ORAL
  Filled 2021-06-08 (×115): qty 1

## 2021-06-08 NOTE — Progress Notes (Signed)
PROGRESS NOTE    Sabrina Johnston  MPN:[id number] DOB: 04/10/1956 DOA: 06/01/2021 PCP: Lauro Regulus, MD   Assessment & Plan:   Active Problems:   Generalized epilepsy (HCC)   Severe intellectual disability   Bacteremia due to Gram-negative bacteria   Abnormal TSH   Pressure ulcer with abrasion, blister, partial thickness skin loss involving epidermis and/or dermis (HCC)  Acute metabolic encephalopathy: mental status is labile and pt often becomes agitated   Abnormal TSH: recommend repeat thyroid studies in 4-6 weeks. Unclear previous hx of possible thyroid disorders  Bacteremia: secondary to gram neg bacteria. Discharged on 06/01/2021 and was treated with Rocephin.  Repeat cultures were also negative. Was discharged on 7 more days of Keflex 500 mg 4 times daily.  Will complete keflex course 8/25  Severe intellectual disability: has neurocognitive dysfunction & developmental disorder. Recently eating less and not walking anymore, possibly declining. Continue w/ supportive care.   Pressure ulcer: of right hand which was present on admission. Involving first and third digit. Continue Xeroform dressing to ruptured sites and cover with gauze to prevent her from trying to eat the dressings  Generalized epilepsy: continue on home dose of depakote   Thrombocytosis: etiology unclear. Will continue to monitor   DVT prophylaxis: SCDs  Code Status: full  Family Communication: Disposition Plan: unclear  Level of care: Med-Surg  Status is: Inpatient  Remains inpatient appropriate because:Unsafe d/c plan  Dispo: The patient is from: Group home              Anticipated d/c is to:  unclear              Patient currently is medically stable to d/c.   Difficult to place patient Yes    Consultants:    Procedures:   Antimicrobials: keflex   Subjective: Pt is unable to verbalized her complaints   Objective: Vitals:   06/07/21 0619 06/07/21 1134 06/07/21 1612 06/08/21 0319   BP: (!) 91/58 93/68 90/60  94/64  Pulse: 82 99 89 99  Resp: 16 17  16   Temp: 98.2 F (36.8 C) 97.8 F (36.6 C)  97.9 F (36.6 C)  TempSrc: Oral     SpO2: 100% (!) 74% 92% 97%  Weight:      Height:        Intake/Output Summary (Last 24 hours) at 06/08/2021 0728 Last data filed at 06/08/2021 0300 Gross per 24 hour  Intake 680 ml  Output --  Net 680 ml   Filed Weights   06/01/21 1856  Weight: 61.2 kg    Examination:  General exam: Appears agitated  Respiratory system: clear breath sounds b/l  Cardiovascular system: S1/S2+. No rubs or clicks Gastrointestinal system: Abd is soft, NT, ND & hypoactive bowel sounds  Central nervous system: Alert and awake. Moves all extremities  Skin: bruises over b/l UE and b/l LE  Psychiatry: Judgement and insight appear abnormal. Appears agitated     Data Reviewed: I have personally reviewed following labs and imaging studies  CBC: Recent Labs  Lab 06/01/21 1950 06/03/21 0657 06/08/21 0525  WBC 9.6 10.8* 13.5*  NEUTROABS 4.3  --   --   HGB 11.5* 11.7* 11.1*  HCT 32.5* 32.8* 32.6*  MCV 104.5* 104.8* 108.3*  PLT 236 249 485*   Basic Metabolic Panel: Recent Labs  Lab 06/01/21 1005 06/01/21 1950 06/03/21 0657 06/08/21 0525  NA  --  143 142 143  K 4.1 4.0 3.6 4.9  CL  --  110 106  103  CO2  --  23 25 29   GLUCOSE  --  83 88 88  BUN  --  <5* <5* 11  CREATININE  --  0.79 0.62 1.03*  CALCIUM  --  8.2* 8.5* 8.4*   GFR: Estimated Creatinine Clearance: 45.7 mL/min (A) (by C-G formula based on SCr of 1.03 mg/dL (H)). Liver Function Tests: Recent Labs  Lab 06/01/21 1950  AST 74*  ALT 32  ALKPHOS 142*  BILITOT 0.6  PROT 5.2*  ALBUMIN 2.0*   No results for input(s): LIPASE, AMYLASE in the last 168 hours. Recent Labs  Lab 06/01/21 1950  AMMONIA 44*   Coagulation Profile: Recent Labs  Lab 06/01/21 1950  INR 1.1   Cardiac Enzymes: Recent Labs  Lab 06/01/21 1950  CKTOTAL 252*   BNP (last 3 results) No results  for input(s): PROBNP in the last 8760 hours. HbA1C: No results for input(s): HGBA1C in the last 72 hours. CBG: Recent Labs  Lab 06/02/21 0330 06/02/21 1509 06/02/21 2231 06/03/21 2219  GLUCAP 91 89 82 93   Lipid Profile: No results for input(s): CHOL, HDL, LDLCALC, TRIG, CHOLHDL, LDLDIRECT in the last 72 hours. Thyroid Function Tests: No results for input(s): TSH, T4TOTAL, FREET4, T3FREE, THYROIDAB in the last 72 hours. Anemia Panel: No results for input(s): VITAMINB12, FOLATE, FERRITIN, TIBC, IRON, RETICCTPCT in the last 72 hours. Sepsis Labs: Recent Labs  Lab 06/01/21 1950 06/01/21 2211 06/02/21 0018 06/02/21 0705  LATICACIDVEN 5.6* 3.2* 1.7 0.9    Recent Results (from the past 240 hour(s))  CULTURE, BLOOD (ROUTINE X 2) w Reflex to ID Panel     Status: None   Collection Time: 05/31/21 11:08 AM   Specimen: BLOOD  Result Value Ref Range Status   Specimen Description BLOOD  BR[facility]  Final   Special Requests BOTTLES DRAWN AEROBIC ONLY  BCAV  Final   Culture   Final    NO GROWTH 5 DAYS Performed at [facility], [address] [place], [place] [address]    Report Status 06/05/2021 FINAL  Final  CULTURE, BLOOD (ROUTINE X 2) w Reflex to ID Panel     Status: None   Collection Time: 05/31/21  3:34 PM   Specimen: BLOOD  Result Value Ref Range Status   Specimen Description BLOOD BLOOD RIGHT HAND  Final   Special Requests   Final    BOTTLES DRAWN AEROBIC AND ANAEROBIC Blood Culture adequate volume   Culture   Final    NO GROWTH 5 DAYS Performed at [facility], [address] [place], [place] [address]    Report Status 06/05/2021 FINAL  Final  Culture, blood (routine x 2)     Status: None   Collection Time: 06/01/21  7:50 PM   Specimen: BLOOD  Result Value Ref Range Status   Specimen Description BLOOD BLOOD RIGHT WRIST  Final   Special Requests   Final    BOTTLES DRAWN AEROBIC AND ANAEROBIC Blood Culture results may not be optimal due to an inadequate  volume of blood received in culture bottles   Culture   Final    NO GROWTH 5 DAYS Performed at [facility], [address] [place], [place] [address]    Report Status 06/06/2021 FINAL  Final  Culture, blood (routine x 2)     Status: None   Collection Time: 06/01/21 10:36 PM   Specimen: BLOOD  Result Value Ref Range Status   Specimen Description BLOOD LEFT HAND  Final  Special Requests   Final    BOTTLES DRAWN AEROBIC AND ANAEROBIC Blood Culture results may not be optimal due to an inadequate volume of blood received in culture bottles   Culture   Final    NO GROWTH 5 DAYS Performed at [facility], [address], [place], [place] [address]    Report Status 06/06/2021 FINAL  Final  Urine Culture     Status: None   Collection Time: 06/02/21 12:18 AM   Specimen: Urine, Random  Result Value Ref Range Status   Specimen Description   Final    URINE, RANDOM Performed at [facility], [address], [place], [place] [address]    Special Requests   Final    NONE Performed at [facility], [address] [place], [place] [address]    Culture   Final    NO GROWTH Performed at [facility], [address] [place], [place] [address]    Report Status 06/03/2021 FINAL  Final  Resp Panel by RT-PCR (Flu A&B, Covid) Urine, Clean Catch     Status: None   Collection Time: 06/02/21 12:18 AM   Specimen: Urine, Clean Catch; Nasopharyngeal(NP) swabs in vial transport medium  Result Value Ref Range Status   SARS Coronavirus 2 by RT PCR NEGATIVE NEGATIVE Final    Comment: (NOTE) SARS-CoV-2 target nucleic acids are NOT DETECTED.  The SARS-CoV-2 RNA is generally detectable in upper respiratory specimens during the acute phase of infection. The lowest concentration of SARS-CoV-2 viral copies this assay can detect is 138 copies/mL. A negative result does not preclude SARS-Cov-2 infection and should not be used as the sole basis for treatment  or other patient management decisions. A negative result may occur with  improper specimen collection/handling, submission of specimen other than nasopharyngeal swab, presence of viral mutation(s) within the areas targeted by this assay, and inadequate number of viral copies(<138 copies/mL). A negative result must be combined with clinical observations, patient history, and epidemiological information. The expected result is Negative.  Fact Sheet for Patients:  BloggerCourse.com  Fact Sheet for Healthcare Providers:  SeriousBroker.it  This test is no t yet approved or cleared by the [place] FDA and  has been authorized for detection and/or diagnosis of SARS-CoV-2 by FDA under an Emergency Use Authorization (EUA). This EUA will remain  in effect (meaning this test can be used) for the duration of the COVID-19 declaration under Section 564(b)(1) of the Act, 21 U.S.C.section 360bbb-3(b)(1), unless the authorization is terminated  or revoked sooner.       Influenza A by PCR NEGATIVE NEGATIVE Final   Influenza B by PCR NEGATIVE NEGATIVE Final    Comment: (NOTE) The Xpert Xpress SARS-CoV-2/FLU/RSV plus assay is intended as an aid in the diagnosis of influenza from Nasopharyngeal swab specimens and should not be used as a sole basis for treatment. Nasal washings and aspirates are unacceptable for Xpert Xpress SARS-CoV-2/FLU/RSV testing.  Fact Sheet for Patients: BloggerCourse.com  Fact Sheet for Healthcare Providers: SeriousBroker.it  This test is not yet approved or cleared by the [place] FDA and has been authorized for detection and/or diagnosis of SARS-CoV-2 by FDA under an Emergency Use Authorization (EUA). This EUA will remain in effect (meaning this test can be used) for the duration of the COVID-19 declaration under Section 564(b)(1) of the Act, 21 U.S.C. section  360bbb-3(b)(1), unless the authorization is terminated or revoked.  Performed at [facility], [address], [place], [place] [address]  Radiology Studies: No results found.      Scheduled Meds:  cephALEXin  500 mg Oral QID   divalproex  500 mg Oral BID   DULoxetine  30 mg Oral Daily   DULoxetine  60 mg Oral Daily   enoxaparin (LOVENOX) injection  40 mg Subcutaneous Q24H   Continuous Infusions:   LOS: 5 days    Time spent: 30 mins     Charise Killian, MD [facility] Pager [phone]  If 7PM-7AM, please contact night-coverage 06/08/2021, 7:28 AM

## 2021-06-08 NOTE — Progress Notes (Signed)
OT Cancellation Note  Patient Details Name: AKAYA PROFFIT MRN: [id number] DOB: 08/26/1956   Cancelled Treatment:    Reason Eval/Treat Not Completed: Other (comment). Pt getting second dose of haldol secondary to increased agitation. OT to hold and re-attempt when pt is able to actively participate in therapeutic intervention.   Jackquline Denmark, MS, OTR/L , CBIS ascom [phone]  06/08/21, 12:48 PM

## 2021-06-08 NOTE — Progress Notes (Addendum)
Changed dressing to right hand. Removed dressing. Right index finger is very red/raw. Skin has peeled back off of finger. Wrapped fingers with petroleum gauze and then covered with gauze. And secured with tape. Patient was very tearful and agitated with dressing change. Medicated for pain after dressing change. Sitter assisted. Bed in low position and call bell in reach. Bed alarm present.

## 2021-06-08 NOTE — Progress Notes (Signed)
Pt extremely agitated while techs attempted to get vital signs tonight despite being medicated. We were eventually able to collect a set of vitals utilizing patient's toys as distraction while someone else was giving her juice. Vitals stable:    06/08/21 0319  Vitals  Temp 97.9 F (36.6 C)  BP 94/64  MAP (mmHg) 72  BP Location Right Leg  BP Method Automatic  Patient Position (if appropriate) Lying  Pulse Rate 99  Resp 16  MEWS COLOR  MEWS Score Color Green  Oxygen Therapy  SpO2 97 %  O2 Device Room [id number]

## 2021-06-08 NOTE — Progress Notes (Signed)
Physical Therapy Treatment Patient Details Name: Sabrina Johnston MRN: [id number] DOB: 1955-11-22 Today's Date: 06/08/2021    History of Present Illness Pt is a 65 y/o F admitted on 06/01/21 from a group home with c/o aggressive behavior & agitation. CT revealed small frontal scalp hematoma without skull fx. Pt was recently discharged 3 hours prior. She was previously evaluated for multiple falls & agitation where she was hospitalized for 6 days & treated for E. coli sepsis. 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 PMH: cognitive & neurobehavioral dysfunction, seizure disorder, chronic constipation    PT Comments    Pt was long sitting in bed upon arriving with sitter present. Pt was recently given haldol however was alert. She is non verbal at baseline. BUE mitts donned throughout session with pt hitting herself in head at times. She was encouraged for OOB activity. When pt is willing and following commands (extremely inconsistently) she moves fairly well. Min assist to exit L side of bed. She stood one x EOB however quickly attempted to sit on floor and was total assisted back into bed and repositioned. Extremely impulsive at times. PT recommends 24/7 assistance/supervision at DC. Will continue to follow and progress as able per current POC.    Follow Up Recommendations  Supervision/Assistance - 24 hour;Supervision for mobility/OOB     Equipment Recommendations  None recommended by PT       Precautions / Restrictions Precautions Precautions: Fall Restrictions Weight Bearing Restrictions: No    Mobility  Bed Mobility Overal bed mobility: Needs Assistance Bed Mobility: Supine to Sit;Sit to Supine     Supine to sit: Min assist Sit to supine: Max assist (pt not cooperating and required amx assist to return to bed.)   General bed mobility comments: cognition/impulsivity greatly limits session progression. Pt was able to achieve EOB sitting with min assist  to initiate movements. max assist to return do to not wanting to return to bed. starts hitting herself with mitts donned after returning to supine.    Transfers Overall transfer level: Needs assistance Equipment used: 2 person hand held assist Transfers: Sit to/from Stand Sit to Stand: Min assist;Total assist;+2 safety/equipment         General transfer comment: Attempting standing EOB however pt aggressively attempts to throw herself on floor after only a few secounds in static standing. total assist to prevent falling/sitting on floor.  Ambulation/Gait      General Gait Details: unsafe to attempt at this time.      Balance Overall balance assessment: Needs assistance Sitting-balance support: Feet supported Sitting balance-Leahy Scale: Fair     Standing balance support: Bilateral upper extremity supported;During functional activity Standing balance-Leahy Scale: Poor Standing balance comment: pt is high fall risk      Cognition Arousal/Alertness: Awake/alert Behavior During Therapy: Impulsive;Restless Overall Cognitive Status: History of cognitive impairments - at baseline      General Comments: Pt has severe baseline cognition deficits. Safety concerns throughout session. Pt hits herself wioth mitts donned. poor ability to consistently follow desired commands             Pertinent Vitals/Pain Pain Assessment:  (unable to rate)     PT Goals (current goals can now be found in the care plan section) Acute Rehab PT Goals Patient Stated Goal: none stated Progress towards PT goals: Not progressing toward goals - comment (limited by pt's poor safety awaress/ impulsivity/ non verbal-cognition deficits)    Frequency    Min 2X/week  PT Plan Current plan remains appropriate       AM-PAC PT "6 Clicks" Mobility   Outcome Measure  Help needed turning from your back to your side while in a flat bed without using bedrails?: A Little Help needed moving from  lying on your back to sitting on the side of a flat bed without using bedrails?: A Lot Help needed moving to and from a bed to a chair (including a wheelchair)?: A Lot Help needed standing up from a chair using your arms (e.g., wheelchair or bedside chair)?: A Lot Help needed to walk in hospital room?: A Lot Help needed climbing 3-5 steps with a railing? : A Lot 6 Click Score: 13    End of Session Equipment Utilized During Treatment: Gait belt Activity Tolerance: Other (comment) (limited by pt's willingness/cognition deficits) Patient left: in bed;with bed alarm set;with nursing/sitter in room;with call bell/phone within reach Nurse Communication: Mobility status PT Visit Diagnosis: Unsteadiness on feet (R26.81);Difficulty in walking, not elsewhere classified (R26.2);Muscle weakness (generalized) (M62.81)     Time: 2979-8921 PT Time Calculation (min) (ACUTE ONLY): 12 min  Charges:  $Therapeutic Activity: 8-22 mins                    Jetta Lout PTA 06/08/21, 2:55 PM

## 2021-06-09 LAB — BASIC METABOLIC PANEL
Anion gap: 6 (ref 5–15)
BUN: 10 mg/dL (ref 8–23)
CO2: 30 mmol/L (ref 22–32)
Calcium: 8.4 mg/dL — ABNORMAL LOW (ref 8.9–10.3)
Chloride: 106 mmol/L (ref 98–111)
Creatinine, Ser: 0.89 mg/dL (ref 0.44–1.00)
GFR, Estimated: >60 mL/min (ref >60)
Glucose, Bld: 113 mg/dL — ABNORMAL HIGH (ref 70–99)
Potassium: 5.2 mmol/L — ABNORMAL HIGH (ref 3.5–5.1)
Sodium: 142 mmol/L (ref 135–145)

## 2021-06-09 NOTE — Progress Notes (Signed)
Changed dressing to right hand. Patient tolerated dressing change.

## 2021-06-09 NOTE — TOC Progression Note (Signed)
Transition of Care [facility] - Progression Note    Patient Details  Name: SHAKA ZECH MRN: [id number] Date of Birth: Oct 07, 1956  [facility] CM/SW Contact  Caryn Section, RN Phone Number: 06/09/2021, 8:23 AM  Clinical Narrative:   RNCM spoke to patient's sister yesterday.  She would like her to return to facility if possible.  If not, sister consents to LTC placementment for patient as she understands rehab is not an option as per care team assessment.  Messages left for [place] Blont from [employer] yesterday.  RNCM spoke to [place] today, she states the staff is looking for patient to go to rehab, but nurse Efraim Kaufmann will need to come and assess patient prior to them taking her back to facility.  Awaiting assessment and response from facility.         Expected Discharge Plan and Services                                                 Social Determinants of Health (SDOH) Interventions    Readmission Risk Interventions Readmission Risk Prevention Plan 06/06/2021  Post Dischage Appt Complete  Medication Screening Complete  Transportation Screening Complete  Some recent data might be hidden

## 2021-06-09 NOTE — TOC Progression Note (Signed)
Transition of Care [facility]) - Progression Note    Patient Details  Name: Sabrina Johnston MRN: [id number] Date of Birth: 1955-12-03  [facility] CM/SW Contact  Caryn Section, RN Phone Number: 06/09/2021, 1:41 PM  Clinical Narrative:   spoke to [place] and nurse Efraim Kaufmann state they cannot take patient back to facility due ot level of care.  Spoke to sister about LTC previously and she concurred.        Expected Discharge Plan and Services                                                 Social Determinants of Health (SDOH) Interventions    Readmission Risk Interventions Readmission Risk Prevention Plan 06/06/2021  Post Dischage Appt Complete  Medication Screening Complete  Transportation Screening Complete  Some recent data might be hidden

## 2021-06-09 NOTE — Progress Notes (Signed)
PROGRESS NOTE    Sabrina Johnston  ACZ:[id number] DOB: Sep 10, 1956 DOA: 06/01/2021 PCP: Lauro Regulus, MD   Assessment & Plan:   Active Problems:   Generalized epilepsy (HCC)   Severe intellectual disability   Bacteremia due to Gram-negative bacteria   Abnormal TSH   Pressure ulcer with abrasion, blister, partial thickness skin loss involving epidermis and/or dermis (HCC)  Acute metabolic encephalopathy: labile mental status, often becomes agitated   Abnormal TSH: recommend repeat thyroid studies in 4-6 weeks. Unclear previous hx of possible thyroid disorders  Bacteremia: secondary to gram neg bacteria. Discharged on 06/01/2021 and was treated with Rocephin.  Repeat cultures were also negative. Was discharged on 7 more days of Keflex 500 mg 4 times daily.  Will complete keflex course 8/25  Severe intellectual disability: has neurocognitive dysfunction & developmental disorder. Recently eating less and not walking anymore, possibly declining. Continue w/ supportive care    Pressure ulcer: of right hand which was present on admission. Involving first and third digit. Continue Xeroform dressing to ruptured sites and cover with gauze to prevent her from trying to eat the dressings  Generalized epilepsy: continue on home dose of depakote   Thrombocytosis: etiology unclear. Will continue to monitor   DVT prophylaxis: SCDs  Code Status: full  Family Communication: Disposition Plan: unclear  Level of care: Med-Surg  Status is: Inpatient  Remains inpatient appropriate because:Unsafe d/c plan, CM is working on safe d/c plan still   Dispo: The patient is from: Group home              Anticipated d/c is to:  unclear              Patient currently is medically stable to d/c.   Difficult to place patient Yes    Consultants:    Procedures:   Antimicrobials: keflex   Subjective: Pt was calm and comfortable seeming this morning. Pt is unable to verbalize complaints    Objective: Vitals:   06/08/21 0822 06/08/21 1114 06/08/21 1210 06/09/21 0448  BP: (!) 127/113 (!) 184/150 114/76 119/87  Pulse: (!) 106 (!) 107 (!) 108 92  Resp: 17 17  17   Temp: 98.1 F (36.7 C) 97.8 F (36.6 C)  98.3 F (36.8 C)  TempSrc: Axillary     SpO2: 97% 99%  97%  Weight:      Height:        Intake/Output Summary (Last 24 hours) at 06/09/2021 0728 Last data filed at 06/08/2021 2340 Gross per 24 hour  Intake 1380 ml  Output --  Net 1380 ml   Filed Weights   06/01/21 1856  Weight: 61.2 kg    Examination:  General exam: Appears calm & comfortable  Respiratory system: clear breath sounds b/l. No wheezes Cardiovascular system: S1 & S2+. No rubs or clicks  Gastrointestinal system: Abd is soft, NT, ND & hypoactive bowel sounds  Central nervous system: Alert and awake. Moves all extremities Skin: bruises over b/l UE and b/l LE  Psychiatry: Judgement and insight appear abnormal.     Data Reviewed: I have personally reviewed following labs and imaging studies  CBC: Recent Labs  Lab 06/03/21 0657 06/08/21 0525  WBC 10.8* 13.5*  HGB 11.7* 11.1*  HCT 32.8* 32.6*  MCV 104.8* 108.3*  PLT 249 485*   Basic Metabolic Panel: Recent Labs  Lab 06/03/21 0657 06/08/21 0525  NA 142 143  K 3.6 4.9  CL 106 103  CO2 25 29  GLUCOSE 88 88  BUN <5* 11  CREATININE 0.62 1.03*  CALCIUM 8.5* 8.4*   GFR: Estimated Creatinine Clearance: 45.7 mL/min (A) (by C-G formula based on SCr of 1.03 mg/dL (H)). Liver Function Tests: No results for input(s): AST, ALT, ALKPHOS, BILITOT, PROT, ALBUMIN in the last 168 hours.  No results for input(s): LIPASE, AMYLASE in the last 168 hours. No results for input(s): AMMONIA in the last 168 hours.  Coagulation Profile: No results for input(s): INR, PROTIME in the last 168 hours.  Cardiac Enzymes: No results for input(s): CKTOTAL, CKMB, CKMBINDEX, TROPONINI in the last 168 hours.  BNP (last 3 results) No results for input(s):  PROBNP in the last 8760 hours. HbA1C: No results for input(s): HGBA1C in the last 72 hours. CBG: Recent Labs  Lab 06/02/21 1509 06/02/21 2231 06/03/21 2219  GLUCAP 89 82 93   Lipid Profile: No results for input(s): CHOL, HDL, LDLCALC, TRIG, CHOLHDL, LDLDIRECT in the last 72 hours. Thyroid Function Tests: No results for input(s): TSH, T4TOTAL, FREET4, T3FREE, THYROIDAB in the last 72 hours. Anemia Panel: Recent Labs    06/08/21 0525  VITAMINB12 870   Sepsis Labs: No results for input(s): PROCALCITON, LATICACIDVEN in the last 168 hours.   Recent Results (from the past 240 hour(s))  CULTURE, BLOOD (ROUTINE X 2) w Reflex to ID Panel     Status: None   Collection Time: 05/31/21 11:08 AM   Specimen: BLOOD  Result Value Ref Range Status   Specimen Description BLOOD  BRH  Final   Special Requests BOTTLES DRAWN AEROBIC ONLY  BCAV  Final   Culture   Final    NO GROWTH 5 DAYS Performed at [facility], [address] [place], [place] [address]    Report Status 06/05/2021 FINAL  Final  CULTURE, BLOOD (ROUTINE X 2) w Reflex to ID Panel     Status: None   Collection Time: 05/31/21  3:34 PM   Specimen: BLOOD  Result Value Ref Range Status   Specimen Description BLOOD BLOOD RIGHT HAND  Final   Special Requests   Final    BOTTLES DRAWN AEROBIC AND ANAEROBIC Blood Culture adequate volume   Culture   Final    NO GROWTH 5 DAYS Performed at [facility], [address] [place], [place] [address]    Report Status 06/05/2021 FINAL  Final  Culture, blood (routine x 2)     Status: None   Collection Time: 06/01/21  7:50 PM   Specimen: BLOOD  Result Value Ref Range Status   Specimen Description BLOOD BLOOD RIGHT WRIST  Final   Special Requests   Final    BOTTLES DRAWN AEROBIC AND ANAEROBIC Blood Culture results may not be optimal due to an inadequate volume of blood received in culture bottles   Culture   Final    NO GROWTH 5 DAYS Performed at [facility], [address] [place], [place] [address]    Report Status 06/06/2021 FINAL  Final  Culture, blood (routine x 2)     Status: None   Collection Time: 06/01/21 10:36 PM   Specimen: BLOOD  Result Value Ref Range Status   Specimen Description BLOOD LEFT HAND  Final   Special Requests   Final    BOTTLES DRAWN AEROBIC AND ANAEROBIC Blood Culture results may not be optimal due to an inadequate volume of blood received in culture bottles   Culture   Final    NO GROWTH 5 DAYS Performed at [facility], [address], [place], [place] [address]    Report Status 06/06/2021 FINAL  Final  Urine Culture     Status: None   Collection Time: 06/02/21 12:18 AM   Specimen: Urine, Random  Result Value Ref Range Status   Specimen Description   Final    URINE, RANDOM Performed at [facility], [address] [place], [place] [address]    Special Requests   Final    NONE Performed at [facility], [address] [place], [place] [address]    Culture   Final    NO GROWTH Performed at [facility], [address] [place], [place] [address]    Report Status 06/03/2021 FINAL  Final  Resp Panel by RT-PCR (Flu A&B, Covid) Urine, Clean Catch     Status: None   Collection Time: 06/02/21 12:18 AM   Specimen: Urine, Clean Catch; Nasopharyngeal(NP) swabs in vial transport medium  Result Value Ref Range Status   SARS Coronavirus 2 by RT PCR NEGATIVE NEGATIVE Final    Comment: (NOTE) SARS-CoV-2 target nucleic acids are NOT DETECTED.  The SARS-CoV-2 RNA is generally detectable in upper respiratory specimens during the acute phase of infection. The lowest concentration of SARS-CoV-2 viral copies this assay can detect is 138 copies/mL. A negative result does not preclude SARS-Cov-2 infection and should not be used as the sole basis for treatment or other patient management decisions. A negative result may occur with  improper specimen collection/handling,  submission of specimen other than nasopharyngeal swab, presence of viral mutation(s) within the areas targeted by this assay, and inadequate number of viral copies(<138 copies/mL). A negative result must be combined with clinical observations, patient history, and epidemiological information. The expected result is Negative.  Fact Sheet for Patients:  BloggerCourse.com  Fact Sheet for Healthcare Providers:  SeriousBroker.it  This test is no t yet approved or cleared by the [place] FDA and  has been authorized for detection and/or diagnosis of SARS-CoV-2 by FDA under an Emergency Use Authorization (EUA). This EUA will remain  in effect (meaning this test can be used) for the duration of the COVID-19 declaration under Section 564(b)(1) of the Act, 21 U.S.C.section 360bbb-3(b)(1), unless the authorization is terminated  or revoked sooner.       Influenza A by PCR NEGATIVE NEGATIVE Final   Influenza B by PCR NEGATIVE NEGATIVE Final    Comment: (NOTE) The Xpert Xpress SARS-CoV-2/FLU/RSV plus assay is intended as an aid in the diagnosis of influenza from Nasopharyngeal swab specimens and should not be used as a sole basis for treatment. Nasal washings and aspirates are unacceptable for Xpert Xpress SARS-CoV-2/FLU/RSV testing.  Fact Sheet for Patients: BloggerCourse.com  Fact Sheet for Healthcare Providers: SeriousBroker.it  This test is not yet approved or cleared by the [place] FDA and has been authorized for detection and/or diagnosis of SARS-CoV-2 by FDA under an Emergency Use Authorization (EUA). This EUA will remain in effect (meaning this test can be used) for the duration of the COVID-19 declaration under Section 564(b)(1) of the Act, 21 U.S.C. section 360bbb-3(b)(1), unless the authorization is terminated or revoked.  Performed at [facility], [address], [place], [place] [address]          Radiology Studies: No results found.      Scheduled Meds:  divalproex  500 mg Oral BID   DULoxetine  30 mg Oral Daily   DULoxetine  60 mg Oral Daily   enoxaparin (LOVENOX) injection  40 mg Subcutaneous Q24H   ziprasidone  20 mg Oral BID WC   Continuous Infusions:   LOS: 6 days    Time spent: 30 mins     Charise KillianJamiese M Avabella Wailes, MD [facility] Pager [phone]  If 7PM-7AM, please contact night-coverage 06/09/2021, 7:28 AM

## 2021-06-09 NOTE — Progress Notes (Signed)
Occupational Therapy Treatment Patient Details Name: Sabrina Johnston MRN: [id number] DOB: Jul 30, 1956 Today's Date: 06/09/2021    History of present illness Pt is a 65 y/o F admitted on 06/01/21 from a group home with c/o aggressive behavior & agitation. CT revealed small frontal scalp hematoma without skull fx. Pt was recently discharged 3 hours prior. She was previously evaluated for multiple falls & agitation where she was hospitalized for 6 days & treated for E. coli sepsis. 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 PMH: cognitive & neurobehavioral dysfunction, seizure disorder, chronic constipation   OT comments  Upon entering the room, pt supine in bed with sitter present in the room. Sitter reports pt has not slept since yesterday and is starting to calm down. OT attempting to not over stimulate pt with tasks this session. Pt needing hand over hand assistance to initiate washing face and pt performs briefly with hand over hand to complete. Total A to L hand hygiene. Pt closing eyes for sleep and therapist covering pt and letting her rest. All needs within reach and bed alarm activated.    Follow Up Recommendations  Supervision/Assistance - 24 hour    Equipment Recommendations  None recommended by OT       Precautions / Restrictions Precautions Precautions: Fall        ADL either performed or assessed with clinical judgement   ADL Overall ADL's : Needs assistance/impaired                                       General ADL Comments: total A for hand hygiene and to wash face with hand over hand assist.     Vision Patient Visual Report: No change from baseline            Cognition Arousal/Alertness: Awake/alert Behavior During Therapy: Flat affect Overall Cognitive Status: History of cognitive impairments - at baseline                                 General Comments: Pt has severe baseline cognition deficits.  Safety concerns throughout session.                   Pertinent Vitals/ Pain       Pain Assessment: Faces Faces Pain Scale: No hurt   Frequency  Min 2X/week        Progress Toward Goals  OT Goals(current goals can now be found in the care plan section)  Progress towards OT goals: Progressing toward goals  Acute Rehab OT Goals Patient Stated Goal: none stated OT Goal Formulation: Patient unable to participate in goal setting Time For Goal Achievement: 06/20/21 Potential to Achieve Goals: Fair  Plan Discharge plan remains appropriate       AM-PAC OT "6 Clicks" Daily Activity     Outcome Measure   Help from another person eating meals?: A Lot Help from another person taking care of personal grooming?: A Lot Help from another person toileting, which includes using toliet, bedpan, or urinal?: Total Help from another person bathing (including washing, rinsing, drying)?: A Lot Help from another person to put on and taking off regular upper body clothing?: A Lot Help from another person to put on and taking off regular lower body clothing?: Total 6 Click Score: 10    End of Session  OT Visit Diagnosis: Muscle weakness (generalized) (M62.81);Unsteadiness on feet (R26.81)   Activity Tolerance Patient tolerated treatment well   Patient Left in bed;with call bell/phone within reach;with bed alarm set;with nursing/sitter in room   Nurse Communication Mobility status        Time: 1357-1410 OT Time Calculation (min): 13 min  Charges: OT General Charges $OT Visit: 1 Visit OT Treatments $Self Care/Home Management : 8-22 mins  Jackquline Denmark, MS, OTR/L , CBIS ascom [phone]  06/09/21, 4:11 PM

## 2021-06-09 NOTE — TOC Progression Note (Signed)
Transition of Care [facility]) - Progression Note    Patient Details  Name: Sabrina Johnston MRN: [id number] Date of Birth: 04-15-1956  [facility] CM/SW Contact  Cowlington Cellar, RN Phone Number: 06/09/2021, 2:07 PM  Clinical Narrative:    Updated [place] [facility] confirmed patient would not be able to return to facility due to increased needs. Will need to begin LTC bed search.         Expected Discharge Plan and Services                                                 Social Determinants of Health (SDOH) Interventions    Readmission Risk Interventions Readmission Risk Prevention Plan 06/06/2021  Post Dischage Appt Complete  Medication Screening Complete  Transportation Screening Complete  Some recent data might be hidden

## 2021-06-10 DIAGNOSIS — E875 Hyperkalemia: Secondary | ICD-10-CM

## 2021-06-10 LAB — POTASSIUM: Potassium: 5.2 mmol/L — ABNORMAL HIGH (ref 3.5–5.1)

## 2021-06-10 MED ORDER — SODIUM ZIRCONIUM CYCLOSILICATE 10 G PO PACK
10.0000 g | PACK | Freq: Once | ORAL | Status: AC
  Administered 2021-06-10: 10:00:00 10 g via ORAL
  Filled 2021-06-10: qty 1

## 2021-06-10 MED ORDER — FUROSEMIDE 10 MG/ML IJ SOLN
40.0000 mg | Freq: Once | INTRAMUSCULAR | Status: DC
  Filled 2021-06-10: qty 4

## 2021-06-10 MED ORDER — FUROSEMIDE 40 MG PO TABS
40.0000 mg | ORAL_TABLET | Freq: Once | ORAL | Status: AC
  Administered 2021-06-10: 40 mg via ORAL
  Filled 2021-06-10: qty 1

## 2021-06-10 NOTE — Progress Notes (Signed)
PROGRESS NOTE    Sabrina Johnston  RUE:[id number] DOB: 07-08-1956 DOA: 06/01/2021 PCP: Lauro Regulus, MD   Assessment & Plan:   Active Problems:   Generalized epilepsy (HCC)   Severe intellectual disability   Bacteremia due to Gram-negative bacteria   Abnormal TSH   Pressure ulcer with abrasion, blister, partial thickness skin loss involving epidermis and/or dermis (HCC)  Acute metabolic encephalopathy: labile mental status, often becomes agitated   Abnormal TSH: recommend repeat thyroid studies in 4-6 weeks. Unclear previous hx of possible thyroid disorders  Bacteremia: secondary to gram neg bacteria. Discharged on 06/01/2021 and was treated with Rocephin.  Repeat cultures were also negative. Was discharged on 7 more days of Keflex 500 mg 4 times daily.  Completed keflex course on 8/25  Severe intellectual disability: has neurocognitive dysfunction & developmental disorder. Recently eating less and not walking anymore, possibly declining. Continue w/ supportive care    Pressure ulcer: of right hand which was present on admission. Involving first and third digit. Continue Xeroform dressing to ruptured sites and cover with gauze to prevent her from trying to eat the dressings  Hyperkalemia: lasix x1 and lokelma x 1 ordered. Repeat potassium level ordered   Generalized epilepsy: continue on home dose of depakote  Thrombocytosis: etiology unclear. Will continue to monitor    DVT prophylaxis: SCDs  Code Status: full  Family Communication: called pt's sister and no answer but I left a voicemail  Disposition Plan: unclear  Level of care: Med-Surg  Status is: Inpatient  Remains inpatient appropriate because:Unsafe d/c plan, CM is working on safe d/c plan still   Dispo: The patient is from: Group home              Anticipated d/c is to:  unclear              Patient currently is medically stable to d/c.   Difficult to place patient Yes    Consultants:    Procedures:    Antimicrobials: keflex   Subjective: Pt was calm when I evaluated the pt this morning but evidently shortly before hand the pt was very agitated   Objective: Vitals:   06/09/21 1238 06/09/21 2232 06/10/21 0047 06/10/21 0549  BP: 99/65 (!) 106/55  121/88  Pulse: 95 99 91 (!) 118  Resp: 20 16  18   Temp: 98.4 F (36.9 C) (!) 97.4 F (36.3 C)    TempSrc:      SpO2: 99% (!) 87% 100% 96%  Weight:      Height:       No intake or output data in the 24 hours ending 06/10/21 0725  Filed Weights   06/01/21 1856  Weight: 61.2 kg    Examination:  General exam: Appears calm  Respiratory system: clear breath sounds b/l  Cardiovascular system: S1/S2+. No rubs or clicks   Gastrointestinal system: Abd is soft, NT, ND & hypoactive bowel sounds  Central nervous system: Alert and awake. Moves all extremities  Skin: bruises over b/l UE and b/l LE  Psychiatry: judgement and insight appears abnormal.    Data Reviewed: I have personally reviewed following labs and imaging studies  CBC: Recent Labs  Lab 06/08/21 0525  WBC 13.5*  HGB 11.1*  HCT 32.6*  MCV 108.3*  PLT 485*   Basic Metabolic Panel: Recent Labs  Lab 06/08/21 0525 06/09/21 1258  NA 143 142  K 4.9 5.2*  CL 103 106  CO2 29 30  GLUCOSE 88 113*  BUN 11  10  CREATININE 1.03* 0.89  CALCIUM 8.4* 8.4*   GFR: Estimated Creatinine Clearance: 52.9 mL/min (by C-G formula based on SCr of 0.89 mg/dL). Liver Function Tests: No results for input(s): AST, ALT, ALKPHOS, BILITOT, PROT, ALBUMIN in the last 168 hours.  No results for input(s): LIPASE, AMYLASE in the last 168 hours. No results for input(s): AMMONIA in the last 168 hours.  Coagulation Profile: No results for input(s): INR, PROTIME in the last 168 hours.  Cardiac Enzymes: No results for input(s): CKTOTAL, CKMB, CKMBINDEX, TROPONINI in the last 168 hours.  BNP (last 3 results) No results for input(s): PROBNP in the last 8760 hours. HbA1C: No results for  input(s): HGBA1C in the last 72 hours. CBG: Recent Labs  Lab 06/03/21 2219  GLUCAP 93   Lipid Profile: No results for input(s): CHOL, HDL, LDLCALC, TRIG, CHOLHDL, LDLDIRECT in the last 72 hours. Thyroid Function Tests: No results for input(s): TSH, T4TOTAL, FREET4, T3FREE, THYROIDAB in the last 72 hours. Anemia Panel: Recent Labs    06/08/21 0525  VITAMINB12 870   Sepsis Labs: No results for input(s): PROCALCITON, LATICACIDVEN in the last 168 hours.   Recent Results (from the past 240 hour(s))  CULTURE, BLOOD (ROUTINE X 2) w Reflex to ID Panel     Status: None   Collection Time: 05/31/21 11:08 AM   Specimen: BLOOD  Result Value Ref Range Status   Specimen Description BLOOD  BRH  Final   Special Requests BOTTLES DRAWN AEROBIC ONLY  BCAV  Final   Culture   Final    NO GROWTH 5 DAYS Performed at [facility], [address] [place], [place] [address]    Report Status 06/05/2021 FINAL  Final  CULTURE, BLOOD (ROUTINE X 2) w Reflex to ID Panel     Status: None   Collection Time: 05/31/21  3:34 PM   Specimen: BLOOD  Result Value Ref Range Status   Specimen Description BLOOD BLOOD RIGHT HAND  Final   Special Requests   Final    BOTTLES DRAWN AEROBIC AND ANAEROBIC Blood Culture adequate volume   Culture   Final    NO GROWTH 5 DAYS Performed at [facility], [address] [place], [place] [address]    Report Status 06/05/2021 FINAL  Final  Culture, blood (routine x 2)     Status: None   Collection Time: 06/01/21  7:50 PM   Specimen: BLOOD  Result Value Ref Range Status   Specimen Description BLOOD BLOOD RIGHT WRIST  Final   Special Requests   Final    BOTTLES DRAWN AEROBIC AND ANAEROBIC Blood Culture results may not be optimal due to an inadequate volume of blood received in culture bottles   Culture   Final    NO GROWTH 5 DAYS Performed at [facility], [address] [place], [place] [address]    Report Status 06/06/2021 FINAL  Final   Culture, blood (routine x 2)     Status: None   Collection Time: 06/01/21 10:36 PM   Specimen: BLOOD  Result Value Ref Range Status   Specimen Description BLOOD LEFT HAND  Final   Special Requests   Final    BOTTLES DRAWN AEROBIC AND ANAEROBIC Blood Culture results may not be optimal due to an inadequate volume of blood received in culture bottles   Culture   Final    NO GROWTH 5 DAYS Performed at [facility], [address] [place], [place] [address]    Report  Status 06/06/2021 FINAL  Final  Urine Culture     Status: None   Collection Time: 06/02/21 12:18 AM   Specimen: Urine, Random  Result Value Ref Range Status   Specimen Description   Final    URINE, RANDOM Performed at [facility], [address], [place], [place] [address]    Special Requests   Final    NONE Performed at [facility], [address] [place], [place] [address]    Culture   Final    NO GROWTH Performed at [facility], [address], [place], [place] [address]    Report Status 06/03/2021 FINAL  Final  Resp Panel by RT-PCR (Flu A&B, Covid) Urine, Clean Catch     Status: None   Collection Time: 06/02/21 12:18 AM   Specimen: Urine, Clean Catch; Nasopharyngeal(NP) swabs in vial transport medium  Result Value Ref Range Status   SARS Coronavirus 2 by RT PCR NEGATIVE NEGATIVE Final    Comment: (NOTE) SARS-CoV-2 target nucleic acids are NOT DETECTED.  The SARS-CoV-2 RNA is generally detectable in upper respiratory specimens during the acute phase of infection. The lowest concentration of SARS-CoV-2 viral copies this assay can detect is 138 copies/mL. A negative result does not preclude SARS-Cov-2 infection and should not be used as the sole basis for treatment or other patient management decisions. A negative result may occur with  improper specimen collection/handling, submission of specimen other than nasopharyngeal swab, presence of viral mutation(s) within  the areas targeted by this assay, and inadequate number of viral copies(<138 copies/mL). A negative result must be combined with clinical observations, patient history, and epidemiological information. The expected result is Negative.  Fact Sheet for Patients:  BloggerCourse.com  Fact Sheet for Healthcare Providers:  SeriousBroker.it  This test is no t yet approved or cleared by the [place] FDA and  has been authorized for detection and/or diagnosis of SARS-CoV-2 by FDA under an Emergency Use Authorization (EUA). This EUA will remain  in effect (meaning this test can be used) for the duration of the COVID-19 declaration under Section 564(b)(1) of the Act, 21 U.S.C.section 360bbb-3(b)(1), unless the authorization is terminated  or revoked sooner.       Influenza A by PCR NEGATIVE NEGATIVE Final   Influenza B by PCR NEGATIVE NEGATIVE Final    Comment: (NOTE) The Xpert Xpress SARS-CoV-2/FLU/RSV plus assay is intended as an aid in the diagnosis of influenza from Nasopharyngeal swab specimens and should not be used as a sole basis for treatment. Nasal washings and aspirates are unacceptable for Xpert Xpress SARS-CoV-2/FLU/RSV testing.  Fact Sheet for Patients: BloggerCourse.com  Fact Sheet for Healthcare Providers: SeriousBroker.it  This test is not yet approved or cleared by the [place] FDA and has been authorized for detection and/or diagnosis of SARS-CoV-2 by FDA under an Emergency Use Authorization (EUA). This EUA will remain in effect (meaning this test can be used) for the duration of the COVID-19 declaration under Section 564(b)(1) of the Act, 21 U.S.C. section 360bbb-3(b)(1), unless the authorization is terminated or revoked.  Performed at [facility], [address] [place], [place] [address]          Radiology Studies: No results  found.      Scheduled Meds:  divalproex  500 mg Oral BID   DULoxetine  30 mg Oral Daily   DULoxetine  60 mg Oral Daily   enoxaparin (LOVENOX) injection  40 mg Subcutaneous Q24H   furosemide  40 mg  Intravenous Once   sodium zirconium cyclosilicate  10 g Oral Once   ziprasidone  20 mg Oral BID WC   Continuous Infusions:   LOS: 7 days    Time spent: 20 mins     Charise Killian, MD [facility] Pager [phone]  If 7PM-7AM, please contact night-coverage 06/10/2021, 7:25 AM
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
## 2021-06-10 NOTE — Progress Notes (Signed)
Patient VS was nat taking on resting stage , dressing on her right hand changed at that time and also her brief.

## 2021-06-11 ENCOUNTER — Inpatient Hospital Stay: Payer: Medicare Other

## 2021-06-11 ENCOUNTER — Other Ambulatory Visit: Payer: Self-pay

## 2021-06-11 DIAGNOSIS — G40309 Generalized idiopathic epilepsy and epileptic syndromes, not intractable, without status epilepticus: Secondary | ICD-10-CM

## 2021-06-11 DIAGNOSIS — E875 Hyperkalemia: Secondary | ICD-10-CM

## 2021-06-11 MED ORDER — ZIPRASIDONE MESYLATE 20 MG IM SOLR
10.0000 mg | Freq: Four times a day (QID) | INTRAMUSCULAR | Status: DC | PRN
  Administered 2021-06-11 – 2021-07-31 (×12): 10 mg via INTRAMUSCULAR
  Filled 2021-06-11 (×23): qty 20

## 2021-06-11 MED ORDER — SODIUM ZIRCONIUM CYCLOSILICATE 10 G PO PACK
10.0000 g | PACK | Freq: Once | ORAL | Status: AC
  Administered 2021-06-11: 12:00:00 10 g via ORAL
  Filled 2021-06-11 (×2): qty 1

## 2021-06-11 NOTE — Progress Notes (Signed)
Restless, agitated, moaning out all shift despite medications

## 2021-06-11 NOTE — Progress Notes (Signed)
PROGRESS NOTE    Sabrina Johnston  TKZ:[id number] DOB: 21-Mar-1956 DOA: 06/01/2021 PCP: Lauro Regulus, MD   Assessment & Plan:   Active Problems:   Generalized epilepsy (HCC)   Severe intellectual disability   Bacteremia due to Gram-negative bacteria   Abnormal TSH   Pressure ulcer with abrasion, blister, partial thickness skin loss involving epidermis and/or dermis (HCC)  Acute metabolic encephalopathy: labile mental status, often becomes agitated   Abnormal TSH: recommend repeat thyroid studies in 4-6 weeks. Unclear previous hx of possible thyroid disorders  Bacteremia: secondary to gram neg bacteria. Discharged on 06/01/2021 and was treated with Rocephin.  Repeat cultures were also negative. Was discharged on 7 more days of Keflex 500 mg 4 times daily.  Completed keflex course on 8/25  Severe intellectual disability: has neurocognitive dysfunction & developmental disorder. Recently eating less and not walking anymore, possibly declining. Continue w/ supportive care    Pressure ulcer: of right hand which was present on admission. Involving first and third digit. Continue Xeroform dressing to ruptured sites and cover with gauze to prevent her from trying to eat the dressings  Hyperkalemia: unchanged from day prior. Lokelma x 1. Repeat K ordered for tomorrow  Generalized epilepsy: continue on home dose of depakote   Thrombocytosis: etiology unclear. Will continue to monitor    DVT prophylaxis: SCDs  Code Status: full  Family Communication: called pt's sister and no answer but I left a voicemail  Disposition Plan: unclear  Level of care: Med-Surg  Status is: Inpatient  Remains inpatient appropriate because:Unsafe d/c plan, CM is working on safe d/c plan still   Dispo: The patient is from: Group home              Anticipated d/c is to:  unclear              Patient currently is medically stable to d/c.   Difficult to place patient Yes    Consultants:     Procedures:   Antimicrobials: keflex   Subjective: Pt was agitated when I evaluated the pt this morning   Objective: Vitals:   06/10/21 0756 06/10/21 1230 06/10/21 1819 06/10/21 1847  BP: (!) 129/53 133/78 100/64   Pulse: 100 (!) 102 (!) 101 98  Resp: 16 18 18    Temp: 97.9 F (36.6 C) 98.2 F (36.8 C) 98 F (36.7 C)   TempSrc:   Oral   SpO2: 99% 96% 98%   Weight:      Height:       No intake or output data in the 24 hours ending 06/11/21 1216   Filed Weights   06/01/21 1856  Weight: 61.2 kg    Examination:  General exam: Appears agitated  Respiratory system: diminished breath sounds b/l Cardiovascular system: S1 & S2+. No rubs or clicks  Gastrointestinal system: Abd is soft, NT, ND & hypoactive bowel sounds   Central nervous system: Alert and awake. Moves all extremities  Skin: bruises over b/l UE and b/l LE  Psychiatry: judgement and insight appears abnormal.    Data Reviewed: I have personally reviewed following labs and imaging studies  CBC: Recent Labs  Lab 06/08/21 0525  WBC 13.5*  HGB 11.1*  HCT 32.6*  MCV 108.3*  PLT 485*   Basic Metabolic Panel: Recent Labs  Lab 06/08/21 0525 06/09/21 1258 06/10/21 1944  NA 143 142  --   K 4.9 5.2* 5.2*  CL 103 106  --   CO2 29 30  --  GLUCOSE 88 113*  --   BUN 11 10  --   CREATININE 1.03* 0.89  --   CALCIUM 8.4* 8.4*  --    GFR: Estimated Creatinine Clearance: 52.9 mL/min (by C-G formula based on SCr of 0.89 mg/dL). Liver Function Tests: No results for input(s): AST, ALT, ALKPHOS, BILITOT, PROT, ALBUMIN in the last 168 hours.  No results for input(s): LIPASE, AMYLASE in the last 168 hours. No results for input(s): AMMONIA in the last 168 hours.  Coagulation Profile: No results for input(s): INR, PROTIME in the last 168 hours.  Cardiac Enzymes: No results for input(s): CKTOTAL, CKMB, CKMBINDEX, TROPONINI in the last 168 hours.  BNP (last 3 results) No results for input(s): PROBNP in  the last 8760 hours. HbA1C: No results for input(s): HGBA1C in the last 72 hours. CBG: No results for input(s): GLUCAP in the last 168 hours.  Lipid Profile: No results for input(s): CHOL, HDL, LDLCALC, TRIG, CHOLHDL, LDLDIRECT in the last 72 hours. Thyroid Function Tests: No results for input(s): TSH, T4TOTAL, FREET4, T3FREE, THYROIDAB in the last 72 hours. Anemia Panel: No results for input(s): VITAMINB12, FOLATE, FERRITIN, TIBC, IRON, RETICCTPCT in the last 72 hours.  Sepsis Labs: No results for input(s): PROCALCITON, LATICACIDVEN in the last 168 hours.   Recent Results (from the past 240 hour(s))  Culture, blood (routine x 2)     Status: None   Collection Time: 06/01/21  7:50 PM   Specimen: BLOOD  Result Value Ref Range Status   Specimen Description BLOOD BLOOD RIGHT WRIST  Final   Special Requests   Final    BOTTLES DRAWN AEROBIC AND ANAEROBIC Blood Culture results may not be optimal due to an inadequate volume of blood received in culture bottles   Culture   Final    NO GROWTH 5 DAYS Performed at [facility], [address] [place], [place] [address]    Report Status 06/06/2021 FINAL  Final  Culture, blood (routine x 2)     Status: None   Collection Time: 06/01/21 10:36 PM   Specimen: BLOOD  Result Value Ref Range Status   Specimen Description BLOOD LEFT HAND  Final   Special Requests   Final    BOTTLES DRAWN AEROBIC AND ANAEROBIC Blood Culture results may not be optimal due to an inadequate volume of blood received in culture bottles   Culture   Final    NO GROWTH 5 DAYS Performed at [facility], [address], [place], [place] [address]    Report Status 06/06/2021 FINAL  Final  Urine Culture     Status: None   Collection Time: 06/02/21 12:18 AM   Specimen: Urine, Random  Result Value Ref Range Status   Specimen Description   Final    URINE, RANDOM Performed at [facility], [address] [place], [place] [address]     Special Requests   Final    NONE Performed at [facility], [address], [place], [place] [address]    Culture   Final    NO GROWTH Performed at [facility], [address], [place], [place] [address]    Report Status 06/03/2021 FINAL  Final  Resp Panel by RT-PCR (Flu A&B, Covid) Urine, Clean Catch     Status: None   Collection Time: 06/02/21 12:18 AM   Specimen: Urine, Clean Catch; Nasopharyngeal(NP) swabs in vial transport medium  Result Value Ref Range Status   SARS Coronavirus 2 by RT PCR NEGATIVE  NEGATIVE Final    Comment: (NOTE) SARS-CoV-2 target nucleic acids are NOT DETECTED.  The SARS-CoV-2 RNA is generally detectable in upper respiratory specimens during the acute phase of infection. The lowest concentration of SARS-CoV-2 viral copies this assay can detect is 138 copies/mL. A negative result does not preclude SARS-Cov-2 infection and should not be used as the sole basis for treatment or other patient management decisions. A negative result may occur with  improper specimen collection/handling, submission of specimen other than nasopharyngeal swab, presence of viral mutation(s) within the areas targeted by this assay, and inadequate number of viral copies(<138 copies/mL). A negative result must be combined with clinical observations, patient history, and epidemiological information. The expected result is Negative.  Fact Sheet for Patients:  BloggerCourse.com  Fact Sheet for Healthcare Providers:  SeriousBroker.it  This test is no t yet approved or cleared by the [place] FDA and  has been authorized for detection and/or diagnosis of SARS-CoV-2 by FDA under an Emergency Use Authorization (EUA). This EUA will remain  in effect (meaning this test can be used) for the duration of the COVID-19 declaration under Section 564(b)(1) of the Act, 21 U.S.C.section 360bbb-3(b)(1), unless the authorization  is terminated  or revoked sooner.       Influenza A by PCR NEGATIVE NEGATIVE Final   Influenza B by PCR NEGATIVE NEGATIVE Final    Comment: (NOTE) The Xpert Xpress SARS-CoV-2/FLU/RSV plus assay is intended as an aid in the diagnosis of influenza from Nasopharyngeal swab specimens and should not be used as a sole basis for treatment. Nasal washings and aspirates are unacceptable for Xpert Xpress SARS-CoV-2/FLU/RSV testing.  Fact Sheet for Patients: BloggerCourse.com  Fact Sheet for Healthcare Providers: SeriousBroker.it  This test is not yet approved or cleared by the [place] FDA and has been authorized for detection and/or diagnosis of SARS-CoV-2 by FDA under an Emergency Use Authorization (EUA). This EUA will remain in effect (meaning this test can be used) for the duration of the COVID-19 declaration under Section 564(b)(1) of the Act, 21 U.S.C. section 360bbb-3(b)(1), unless the authorization is terminated or revoked.  Performed at [facility], [address] [place], [place] [address]          Radiology Studies: No results found.      Scheduled Meds:  divalproex  500 mg Oral BID   DULoxetine  30 mg Oral Daily   DULoxetine  60 mg Oral Daily   enoxaparin (LOVENOX) injection  40 mg Subcutaneous Q24H   ziprasidone  20 mg Oral BID WC   Continuous Infusions:   LOS: 8 days    Time spent: 15 mins     Charise Killian, MD [facility] Pager [phone]  If 7PM-7AM, please contact night-coverage 06/11/2021, 12:16 PM

## 2021-06-11 NOTE — Progress Notes (Signed)
Refuses dressing change, will not leave dressing in place

## 2021-06-12 LAB — CBC
HCT: 30.3 % — ABNORMAL LOW (ref 36.0–46.0)
Hemoglobin: 10.2 g/dL — ABNORMAL LOW (ref 12.0–15.0)
MCH: 37.1 pg — ABNORMAL HIGH (ref 26.0–34.0)
MCHC: 33.7 g/dL (ref 30.0–36.0)
MCV: 110.2 fL — ABNORMAL HIGH (ref 80.0–100.0)
Platelets: 406 10*3/uL — ABNORMAL HIGH (ref 150–400)
RBC: 2.75 MIL/uL — ABNORMAL LOW (ref 3.87–5.11)
RDW: 17 % — ABNORMAL HIGH (ref 11.5–15.5)
WBC: 11.3 10*3/uL — ABNORMAL HIGH (ref 4.0–10.5)
nRBC: 1.9 % — ABNORMAL HIGH (ref 0.0–0.2)

## 2021-06-12 LAB — BASIC METABOLIC PANEL
Anion gap: 11 (ref 5–15)
BUN: 18 mg/dL (ref 8–23)
CO2: 27 mmol/L (ref 22–32)
Calcium: 8.4 mg/dL — ABNORMAL LOW (ref 8.9–10.3)
Chloride: 102 mmol/L (ref 98–111)
Creatinine, Ser: 1.49 mg/dL — ABNORMAL HIGH (ref 0.44–1.00)
GFR, Estimated: 39 mL/min — ABNORMAL LOW (ref >60)
Glucose, Bld: 117 mg/dL — ABNORMAL HIGH (ref 70–99)
Potassium: 3.8 mmol/L (ref 3.5–5.1)
Sodium: 140 mmol/L (ref 135–145)

## 2021-06-12 NOTE — [place] FL2 (Signed)
[place] MEDICAID FL2 LEVEL OF CARE SCREENING TOOL     IDENTIFICATION  Patient Name: Sabrina Johnston Birthdate: 1955/12/10 Sex: female Admission Date (Current Location): 06/01/2021  [facility] and [place] Number:  Chiropodist and Address:  [facility], [address], [place], [place] [address]      Provider Number: [id number]  Attending Physician Name and Address:  Charise Killian, MD  Relative Name and Phone Number:  Coble,Lisa (Legal Guardian) (Sister)   [phone] (Mobile)    Current Level of Care: SNF Recommended Level of Care: Skilled Nursing Facility Prior Approval Number:    Date Approved/Denied:   PASRR Number: pending  Discharge Plan: SNF    Current Diagnoses: Patient Active Problem List   Diagnosis Date Noted   Pressure ulcer with abrasion, blister, partial thickness skin loss involving epidermis and/or dermis (HCC) 06/05/2021   Abnormal TSH 06/03/2021   Bacteremia due to Gram-negative bacteria    Seizure disorder (HCC)    Dehydration 05/27/2021   Altered mental status    Hypotension    Loose stools 10/25/2016   Menopause 10/25/2016   Chronic constipation 12/10/2014   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   Severe intellectual disability 12/10/2014    Orientation RESPIRATION BLADDER Height & Weight     Self  Normal Incontinent Weight: 61.2 kg Height:  5' (152.4 cm)  BEHAVIORAL SYMPTOMS/MOOD NEUROLOGICAL BOWEL NUTRITION STATUS    Convulsions/Seizures Incontinent Diet (dysphagia 3)  AMBULATORY STATUS COMMUNICATION OF NEEDS Skin   Total Care Non-Verbally Other (Comment) (excoriated abdomen, groin, leg, abrasion L anterior leg, blisters on fingers with dressing)                       Personal Care Assistance Level of Assistance  Bathing, Feeding, Dressing Bathing Assistance: Maximum assistance Feeding assistance: Maximum assistance Dressing Assistance: Maximum assistance     Functional Limitations Info   Sight, Hearing, Speech Sight Info: Impaired Hearing Info:  (mute) Speech Info: Impaired ([place])    SPECIAL CARE FACTORS FREQUENCY                       Contractures Contractures Info: Not present    Additional Factors Info  Allergies, Code Status Code Status Info: FULL CODE Allergies Info: No Known Allergies           Current Medications (06/12/2021):  This is the current hospital active medication list Current Facility-Administered Medications  Medication Dose Route Frequency Provider Last Rate Last Admin   acetaminophen (TYLENOL) tablet 650 mg  650 mg Oral Q6H PRN Agbata, Tochukwu, MD       Or   acetaminophen (TYLENOL) suppository 650 mg  650 mg Rectal Q6H PRN Agbata, Tochukwu, MD   650 mg at 06/02/21 1527   divalproex (DEPAKOTE) DR tablet 500 mg  500 mg Oral BID Lewie Chamber, MD   500 mg at 06/12/21 8101   DULoxetine (CYMBALTA) DR capsule 30 mg  30 mg Oral Daily Lewie Chamber, MD   30 mg at 06/12/21 7510   DULoxetine (CYMBALTA) DR capsule 60 mg  60 mg Oral Daily Lewie Chamber, MD   60 mg at 06/12/21 0938   enoxaparin (LOVENOX) injection 40 mg  40 mg Subcutaneous Q24H Charise Killian, MD   40 mg at 06/11/21 2034   hydrOXYzine (ATARAX/VISTARIL) tablet 25 mg  25 mg Oral TID PRN Lewie Chamber, MD   25 mg at 06/12/21 2585   loperamide (IMODIUM) capsule 2 mg  2 mg Oral Q4H PRN Lewie Chamber, MD       ondansetron [facility]) tablet 4 mg  4 mg Oral Q6H PRN Agbata, Tochukwu, MD       Or   ondansetron (ZOFRAN) injection 4 mg  4 mg Intravenous Q6H PRN Agbata, Tochukwu, MD       oxyCODONE-acetaminophen (PERCOCET/ROXICET) 5-325 MG per tablet 1 tablet  1 tablet Oral Q6H PRN Charise Killian, MD   1 tablet at 06/12/21 0937   traMADol (ULTRAM) tablet 50 mg  50 mg Oral Q6H PRN Charise Killian, MD   50 mg at 06/11/21 2034   ziprasidone (GEODON) capsule 20 mg  20 mg Oral BID WC Charise Killian, MD   20 mg at 06/12/21 1610   ziprasidone (GEODON) injection 10 mg  10 mg  Intramuscular Q6H PRN Mansy, Jan A, MD   10 mg at 06/11/21 1232     Discharge Medications: Please see discharge summary for a list of discharge medications.  Relevant Imaging Results:  Relevant Lab Results:   Additional Information SSN [id number]  Caryn Section, RN

## 2021-06-12 NOTE — TOC Progression Note (Signed)
Transition of Care [facility]) - Progression Note    Patient Details  Name: Sabrina Johnston MRN: [id number] Date of Birth: [birth year]  [facility]) CM/SW Contact  Caryn Section, RN Phone Number: 06/12/2021, 3:57 PM  Clinical Narrative:   Family consents to LTC placement, bed search started.  As per [facility] finance, patient has medicaid.  Awaiting responses from bedsearch         Expected Discharge Plan and Services                                                 Social Determinants of Health (SDOH) Interventions    Readmission Risk Interventions Readmission Risk Prevention Plan 06/06/2021  Post Dischage Appt Complete  Medication Screening Complete  Transportation Screening Complete  Some recent data might be hidden
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
## 2021-06-13 MED ORDER — DIPHENHYDRAMINE HCL 25 MG PO CAPS
50.0000 mg | ORAL_CAPSULE | Freq: Four times a day (QID) | ORAL | Status: DC | PRN
  Administered 2021-06-13 – 2021-06-14 (×3): 50 mg via ORAL
  Filled 2021-06-13 (×3): qty 2

## 2021-06-13 NOTE — Progress Notes (Signed)
OT Cancellation Note  Patient Details Name: Sabrina Johnston MRN: [id number] DOB: Sep 13, 1956   Cancelled Treatment:    Reason Eval/Treat Not Completed: Other (comment). RN able to get pt to chair this morning. Pt remains agitated, has sitter in the room, and is not making progress towards therapy goals at this time. Pt unable to actively participate with therapeutic intervention and attempts to throw herself to floor with attempts. Case management appears to be seeking LTC placement. OT to SIGN OFF at this time.   Jackquline Denmark, MS, OTR/L , CBIS ascom [phone]  06/13/21, 10:48 AM

## 2021-06-13 NOTE — Progress Notes (Signed)
PROGRESS NOTE   HPI was taken from Dr. Joylene Igo: Sabrina Johnston is a 65 y.o. female with medical history significant for cognitive and neurobehavioral dysfunction, seizure disorder and chronic constipation, who presented to the emergency room 3 hours after her discharge for evaluation of multiple falls and agitation.   She was hospitalized for 6 days and treated for E. coli sepsis with IV antibiotics. 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 She was seen in consultation by neurology and had an EEG which did not show any seizure activity even the patient required 3 mg of IV Ativan for agitation prior to getting the EEG done. Patient was sent back to the emergency room for evaluation from the group home because of aggressive behavior and agitation.  She has sustained 3 falls and struck her head but there was no loss of consciousness or seizure-like activity. Labs in the emergency room showed a lactic acid of 5.6 which has since normalized. I am unable to do a review of systems on this patient since she is sedated and nonverbal. Labs show sodium 143, potassium 4.0, chloride 110, bicarb 23, glucose 83, BUN less than 5, creatinine 0.79, calcium 8.2, alkaline phosphatase 142, albumin 2.0, AST 74, ALT 32, total protein 5.2, ammonia 44, total CK2 52, lactic acid 5. >> 0.9, white count 9.6, hemoglobin 11.5, hematocrit 32.5, MCV 104.5, RDW 14.7, platelet count 236, PT 13.9, INR 1.1, TSH 9.09 Respiratory viral panel is negative Chest x-ray reviewed by me shows low lung volumes with no active disease. CT scan of the head without contrast/maxillofacial CT/CT scan of cervical spine shows markedly enlarged lateral ventricles with colpocephaly configuration, likely congenital.  Small frontal scalp hematoma without  skull fracture.  No facial fracture.  No acute fracture or static subluxation of the cervical spine. Twelve-lead EKG reviewed by me shows normal sinus rhythm with nonspecific ST changes.     ED Course: Patient is a 65 year old female with a history of cognitive and neurobehavioral dysfunction who resides in a group home and was discharged from the hospital on 06/01/21 after hospitalization for E. coli sepsis and metabolic encephalopathy. Patient returned to the emergency room 3 hours after her discharge for evaluation of multiple witnessed falls as well as aggressive behavior towards the staff. Imaging shows a right frontal hematoma and her initial lactic acid level was elevated at 5.6. She required sedation to get a CT scan of her head, cervical spine and maxillofacial CT to rule out any injuries and remains sedated. She was on a Lawyer transiently for hypothermia which has resolved. She will be referred to observation status for further evaluation.   Hospital course from Dr. Mayford Knife 8/24-8/30/22: Pt is intermittently agitated throughout entire hospital stay despite trying multiple different medications to help w/ agitation. Pt' previous group home has refused to take the pt back. CM is working on safe d/c plan with possible d/c to LTC.    Sabrina Johnston  NWG:[id number] DOB: May 01, 1956 DOA: 06/01/2021 PCP: Lauro Regulus, MD   Assessment & Plan:   Active Problems:   Generalized epilepsy (HCC)   Severe intellectual disability   Bacteremia due to Gram-negative bacteria   Abnormal TSH   Pressure ulcer with abrasion, blister, partial thickness skin loss involving epidermis and/or dermis (HCC)  Acute metabolic encephalopathy: labile mental status, often  agitated   Abnormal TSH: recommend repeat thyroid studies in 4-6 weeks. Unclear previous hx of possible thyroid disorders  Bacteremia: secondary to gram neg bacteria. Discharged on 06/01/2021 and was treated with Rocephin.  Repeat  cultures were also negative. Was discharged on 7 more days of Keflex 500 mg 4 times daily.  Completed keflex course on 8/25  Severe intellectual disability: has neurocognitive dysfunction & developmental disorder. Recently eating less and not walking anymore, possibly declining. Continue w/ supportive care  Pressure ulcer: of right hand which was present on admission. Involving first and third digit. Continue Xeroform dressing to ruptured sites and cover with gauze to prevent her from trying to eat the dressings  Hyperkalemia: resolved  Generalized epilepsy: continue on home dose of depakote  Thrombocytosis: etiology unclear, will continue to monitor   DVT prophylaxis: SCDs  Code Status: full  Family Communication: Disposition Plan: CM trying to get the pt to LTC   Level of care: Med-Surg  Status is: Inpatient  Remains inpatient appropriate because:Unsafe d/c plan, CM is working on getting the pt to LTC   Dispo: The patient is from: Group home              Anticipated d/c is to:  unclear , possible LTC              Patient currently is medically stable to d/c.   Difficult to place patient Yes    Consultants:    Procedures:   Antimicrobials:   Subjective: Pt is agitated this morning   Objective: Vitals:   06/12/21 0950 06/12/21 1231 06/12/21 1624 06/12/21 2004  BP: (!) 116/59 (!) 102/51 (!) 155/117 108/61  Pulse: 80 87 93 99  Resp:  18 18 18   Temp:  97.6 F (36.4 C) 97.7 F (36.5 C) 97.6 F (36.4 C)  TempSrc:      SpO2:  95% 96% 99%  Weight:      Height:        Intake/Output Summary (Last 24 hours) at 06/13/2021 0740 Last data filed at 06/12/2021 1300 Gross per 24 hour  Intake 0 ml  Output --  Net 0 ml     Filed Weights   06/01/21 1856  Weight: 61.2 kg    Examination:  General exam: Appears agitated Respiratory system: diminished breath sounds b/l Cardiovascular system: S1 & S2+. No rubs or clicks  Gastrointestinal system: Abd is soft, NT, ND &  hypoactive bowel sounds  Central nervous system: Awake and agitated  Skin: bruises over b/l UE and b/l LE  Psychiatry: judgement and insight appear abnormal    Data Reviewed: I have personally reviewed following labs and imaging studies  CBC: Recent Labs  Lab 06/08/21 0525 06/12/21 0707  WBC 13.5* 11.3*  HGB 11.1* 10.2*  HCT 32.6* 30.3*  MCV 108.3* 110.2*  PLT 485* 406*   Basic Metabolic Panel: Recent Labs  Lab 06/08/21 0525 06/09/21 1258 06/10/21 1944 06/12/21 1121  NA 143 142  --  140  K 4.9 5.2* 5.2* 3.8  CL 103 106  --  102  CO2 29 30  --  27  GLUCOSE 88 113*  --  117*  BUN 11 10  --  18  CREATININE 1.03* 0.89  --  1.49*  CALCIUM 8.4* 8.4*  --  8.4*   GFR: Estimated Creatinine Clearance: 31.6 mL/min (A) (by C-G formula based on SCr of 1.49 mg/dL (H)). Liver Function Tests: No results for input(s): AST, ALT, ALKPHOS, BILITOT, PROT, ALBUMIN in the  last 168 hours.  No results for input(s): LIPASE, AMYLASE in the last 168 hours. No results for input(s): AMMONIA in the last 168 hours.  Coagulation Profile: No results for input(s): INR, PROTIME in the last 168 hours.  Cardiac Enzymes: No results for input(s): CKTOTAL, CKMB, CKMBINDEX, TROPONINI in the last 168 hours.  BNP (last 3 results) No results for input(s): PROBNP in the last 8760 hours. HbA1C: No results for input(s): HGBA1C in the last 72 hours. CBG: No results for input(s): GLUCAP in the last 168 hours.  Lipid Profile: No results for input(s): CHOL, HDL, LDLCALC, TRIG, CHOLHDL, LDLDIRECT in the last 72 hours. Thyroid Function Tests: No results for input(s): TSH, T4TOTAL, FREET4, T3FREE, THYROIDAB in the last 72 hours. Anemia Panel: No results for input(s): VITAMINB12, FOLATE, FERRITIN, TIBC, IRON, RETICCTPCT in the last 72 hours.  Sepsis Labs: No results for input(s): PROCALCITON, LATICACIDVEN in the last 168 hours.   No results found for this or any previous visit (from the past 240  hour(s)).        Radiology Studies: DG Thoracic Spine 2 View  Result Date: 06/11/2021 CLINICAL DATA:  Right knee and upper back pain after multiple falls a group home. EXAM: THORACIC SPINE 2 VIEWS COMPARISON:  None. FINDINGS: There is no evidence of thoracic spine fracture. Alignment is normal. No other significant bone abnormalities are identified. IMPRESSION: Negative. Electronically Signed   By: Gerome Sam III M.D.   On: 06/11/2021 13:34   DG Knee 1-2 Views Right  Result Date: 06/11/2021 CLINICAL DATA:  Pain after multiple falls. EXAM: RIGHT KNEE - 1-2 VIEW COMPARISON:  None. FINDINGS: No evidence of fracture, dislocation, or joint effusion. No evidence of arthropathy or other focal bone abnormality. Soft tissues are unremarkable. IMPRESSION: Negative. Electronically Signed   By: Gerome Sam III M.D.   On: 06/11/2021 13:35        Scheduled Meds:  divalproex  500 mg Oral BID   DULoxetine  30 mg Oral Daily   DULoxetine  60 mg Oral Daily   enoxaparin (LOVENOX) injection  40 mg Subcutaneous Q24H   ziprasidone  20 mg Oral BID WC   Continuous Infusions:   LOS: 10 days    Time spent: 15 mins     Charise Killian, MD [facility] Pager [phone]  If 7PM-7AM, please contact night-coverage 06/13/2021, 7:40 AM

## 2021-06-13 NOTE — Progress Notes (Signed)
PT Cancellation Note  Patient Details Name: Sabrina Johnston MRN: [id number] DOB: 10-Feb-1956   Cancelled Treatment:    Reason Eval/Treat Not Completed:  (agitation; will discharge from PT today) Attempted PT treatment. Patient agitated, in chair with staff assistance and having difficulty being re-directed, unable to participate at this time.  PT has attempted multiple times to see patient for PT intervention. Patient is unable to consistently participate with PT with no carryover of therapist instruction. Do not anticipate patient will progress with PT intervention due to baseline cognition issues. No further skilled PT needs identified, PT will sign off at this time.   Donna Bernard, PT, MPT  [facility] 06/13/2021, 10:51 AM

## 2021-06-14 ENCOUNTER — Encounter: Payer: Self-pay | Admitting: Internal Medicine

## 2021-06-14 ENCOUNTER — Inpatient Hospital Stay: Payer: Medicare Other

## 2021-06-14 LAB — CBC
HCT: 33.1 % — ABNORMAL LOW (ref 36.0–46.0)
Hemoglobin: 10.7 g/dL — ABNORMAL LOW (ref 12.0–15.0)
MCH: 37 pg — ABNORMAL HIGH (ref 26.0–34.0)
MCHC: 32.3 g/dL (ref 30.0–36.0)
MCV: 114.5 fL — ABNORMAL HIGH (ref 80.0–100.0)
Platelets: 360 10*3/uL (ref 150–400)
RBC: 2.89 MIL/uL — ABNORMAL LOW (ref 3.87–5.11)
RDW: 17.2 % — ABNORMAL HIGH (ref 11.5–15.5)
WBC: 11.4 10*3/uL — ABNORMAL HIGH (ref 4.0–10.5)
nRBC: 1.8 % — ABNORMAL HIGH (ref 0.0–0.2)

## 2021-06-14 LAB — CK: Total CK: 2696 U/L — ABNORMAL HIGH (ref 38–234)

## 2021-06-14 LAB — PROCALCITONIN: Procalcitonin: 0.14 ng/mL

## 2021-06-14 MED ORDER — IOHEXOL 350 MG/ML SOLN
60.0000 mL | Freq: Once | INTRAVENOUS | Status: AC | PRN
  Administered 2021-06-14: 60 mL via INTRAVENOUS

## 2021-06-14 MED ORDER — METHOCARBAMOL 500 MG PO TABS
500.0000 mg | ORAL_TABLET | Freq: Once | ORAL | Status: AC
  Administered 2021-06-14: 22:00:00 500 mg via ORAL
  Filled 2021-06-14: qty 1

## 2021-06-14 MED ORDER — SODIUM CHLORIDE 0.9 % IV SOLN: INTRAVENOUS | Status: AC

## 2021-06-14 MED ORDER — POLYETHYLENE GLYCOL 3350 17 G PO PACK
17.0000 g | PACK | Freq: Every day | ORAL | Status: DC
  Administered 2021-06-14 – 2021-06-17 (×4): 17 g via ORAL
  Filled 2021-06-14 (×4): qty 1

## 2021-06-14 NOTE — TOC Progression Note (Signed)
Transition of Care [facility]) - Progression Note    Patient Details  Name: Sabrina Johnston MRN: [id number] Date of Birth: 12/12/1955  Transition [facility] Care [facility]) CM/SW Contact  Caryn Section, RN Phone Number: 06/14/2021, 9:10 AM  Clinical Narrative:   Contacted bed offers to advise about sitter, patient getting out of bed, etc, to see if they can still accept patient, awaiting response.  TOC to follow.         Expected Discharge Plan and Services                                                 Social Determinants of Health (SDOH) Interventions    Readmission Risk Interventions Readmission Risk Prevention Plan 06/06/2021  Post Dischage Appt Complete  Medication Screening Complete  Transportation Screening Complete  Some recent data might be hidden

## 2021-06-14 NOTE — Progress Notes (Signed)
Removed gauze dressing to right hand. Petroleum gauze dried to right index finger. Patient would not let me remove gauze or apply additional gauze. Currently resting in bed with sitter at bedside.

## 2021-06-14 NOTE — Consult Note (Signed)
Neurology Consultation Reason for Consult: Altered mental status Referring Physician: Revonda Humphrey, C  CC: Altered mental status  History is obtained from: Chart review  HPI: Sabrina Johnston is a 65 y.o. female with a history of mental retardation seizure disorder who at baseline is able to feed herself and walk and participate in some care, but is relatively noncommunicative.  She was admitted on 8/13 with altered mental status and difficulty walking.  MRI at that time was negative for acute stroke.   She continues to be confused.  Per nursing, she is constantly agitated, not sleeping at all.   ROS: A 14 point ROS was performed and is negative except as noted in the HPI.  Past Medical History:  Diagnosis Date   Menopause    Mental retardation    PMB (postmenopausal bleeding)      Family History  Family history unknown: Yes     Social History:  reports that she has never smoked. She has never used smokeless tobacco. She reports that she does not drink alcohol and does not use drugs.   Exam: Current vital signs: BP (!) 117/56 (BP Location: Right Arm)   Pulse 92   Temp (!) 97.4 F (36.3 C)   Resp 19   Ht 5' (1.524 m)   Wt 61.2 kg   SpO2 95%   BMI 26.37 kg/m  Vital signs in last 24 hours: Temp:  [97.4 F (36.3 C)-97.8 F (36.6 C)] 97.4 F (36.3 C) (08/31 1221) Pulse Rate:  [91-105] 92 (08/31 1221) Resp:  [18-20] 19 (08/31 1221) BP: (94-146)/(56-96) 117/56 (08/31 1221) SpO2:  [94 %-99 %] 95 % (08/31 1221)   Physical Exam  Constitutional: Does not appear emaciated Psych: Slightly agitated Eyes: No scleral injection HENT: No OP obstruction MSK: no joint deformities.  Cardiovascular: Normal rate and regular rhythm.  Respiratory: Effort normal, non-labored breathing GI: Soft.  No distension. There is no tenderness.  Skin: WDI  Neuro: Mental Status: Patient is awake, alert, she continually tries to roll over, trying to get out of bed.  She does not clearly follow  commands or answer questions. Cranial Nerves: II: Blinks to threat bilaterally. Pupils are equal, round, and reactive to light.   III,IV, VI: She tracks across midline in both directions V,VII: Face is grossly symmetric Motor: She moves all extremities relatively symmetrically.  Sensory: She responds to noxious stim bilaterally.  Cerebellar: Does not perform.    I have reviewed labs in epic and the results pertinent to this consultation are: WBC 11.4  I have reviewed the images obtained:CT head - no acute findings  Impression: 65 year old female with history of mental retardation and seizure disorder with persistent encephalopathy after admission for UTI.  I suspect that delirium is playing a large role in her current presentation.  She is on multiple anticholinergics which can contribute to delirium and per nursing is not sleeping at night.    Recommendations: 1) limit anticholinergic medications including hydroxyzine and Benadryl 2) for sedation, would favor neuroleptic use, particularly at night 3) keep windows open during the day, minimize sedation at night. 4) continue addressing any underlying medical issues as you are doing. 5) continue Depakote 500 mg twice daily   Ritta Slot, MD [facility] [phone]  If 7pm- 7am, please page neurology on call as listed in AMION.

## 2021-06-14 NOTE — Progress Notes (Signed)
[facility] PROGRESS NOTE    Sabrina Johnston  ZOX:[id number] DOB: 26-Jan-1956 DOA: 06/01/2021 PCP: Lauro Regulus, MD      Brief Narrative:  Sabrina Johnston is a 65 y.o. F with IDD, seizures and recent E coli bacteremia who presented with falls and agitation.           Assessment & Plan:  Agitation Very unclear the cause of this.  Patient has severe developmental delay at baseline, and so is unclear how far differentiates from her baseline. - Continue Geodon - Avoid anticholinergics - Delirium precautions  -Obtain blood and urine cultures - Obtain CT abdomen and pelvis with contrast -Check procalcitonin -Check CK   Intellectual or developmental delay  Epilepsy -Continue Depakote, duloxetine  Recent bacteremia This is resolved   AKI Cr up to 1.5 today - IV fluids  Rhabdomyolysis This seems to be from agitation and constant thrashing - IV fluids if able         Disposition: Status is: Inpatient  Remains inpatient appropriate because:Unsafe d/c plan  Dispo: The patient is from: Home              Anticipated d/c is to: SNF              Patient currently is not medically stable to d/c.   Difficult to place patient No       Level of care: Med-Surg       MDM: The below labs and imaging reports were reviewed and summarized above.  Medication management as above.    DVT prophylaxis: enoxaparin (LOVENOX) injection 40 mg Start: 06/07/21 2200 Place and maintain sequential compression device Start: 06/07/21 1434  Code Status: FULL Family Communication:            Subjective: No fever.  Still agitated.  No generalized clonic chronic JBR.  No respiratory distress.  Objective: Vitals:   06/13/21 1918 06/14/21 0500 06/14/21 0741 06/14/21 1221  BP: 131/88 (!) 145/77 (!) 146/96 (!) 117/56  Pulse: (!) 105 98 (!) 104 92  Resp: 20 20 20 19   Temp: 97.8 F (36.6 C) 97.7 F (36.5 C) 97.8 F (36.6 C) (!) 97.4 F (36.3  C)  TempSrc: Axillary Axillary Axillary   SpO2: 95% 94% 96% 95%  Weight:      Height:       No intake or output data in the 24 hours ending 06/14/21 1716 Filed Weights   06/01/21 1856  Weight: 61.2 kg    Examination: General appearance:  adult female, awake, thrashing in bed.   HEENT: Conjunctive are normal, lids and lashes normal.  No nasal deformity or discharge.  Oropharynx tacky dry, does not open mouth for full examination Skin: Warm and dry.  no jaundice.  No suspicious rashes or lesions. Cardiac: Tachycardic, agitated, no murmurs, no lower extremity edema. Respiratory: Tachypneic, no rales or wheezes. Abdomen: Abdomen soft.  No grimace to palpation  MSK: No deformities or effusions. Neuro: Awake but agitated, does not follow commands, seems to have high tone in all 4 extremities, makes eye contact but does not follow commands spontaneous verbalizations, just a lot of grunting.   Psych: Unable to assess.    Data Reviewed: I have personally reviewed following labs and imaging studies:  CBC: Recent Labs  Lab 06/08/21 0525 06/12/21 0707 06/14/21 0713  WBC 13.5* 11.3* 11.4*  HGB 11.1* 10.2* 10.7*  HCT 32.6* 30.3* 33.1*  MCV 108.3* 110.2* 114.5*  PLT 485* 406* 360   Basic  Metabolic Panel: Recent Labs  Lab 06/08/21 0525 06/09/21 1258 06/10/21 1944 06/12/21 1121  NA 143 142  --  140  K 4.9 5.2* 5.2* 3.8  CL 103 106  --  102  CO2 29 30  --  27  GLUCOSE 88 113*  --  117*  BUN 11 10  --  18  CREATININE 1.03* 0.89  --  1.49*  CALCIUM 8.4* 8.4*  --  8.4*   GFR: Estimated Creatinine Clearance: 31.6 mL/min (A) (by C-G formula based on SCr of 1.49 mg/dL (H)). Liver Function Tests: No results for input(s): AST, ALT, ALKPHOS, BILITOT, PROT, ALBUMIN in the last 168 hours. No results for input(s): LIPASE, AMYLASE in the last 168 hours. No results for input(s): AMMONIA in the last 168 hours. Coagulation Profile: No results for input(s): INR, PROTIME in the last 168  hours. Cardiac Enzymes: Recent Labs  Lab 06/14/21 0713  CKTOTAL 2,696*   BNP (last 3 results) No results for input(s): PROBNP in the last 8760 hours. HbA1C: No results for input(s): HGBA1C in the last 72 hours. CBG: No results for input(s): GLUCAP in the last 168 hours. Lipid Profile: No results for input(s): CHOL, HDL, LDLCALC, TRIG, CHOLHDL, LDLDIRECT in the last 72 hours. Thyroid Function Tests: No results for input(s): TSH, T4TOTAL, FREET4, T3FREE, THYROIDAB in the last 72 hours. Anemia Panel: No results for input(s): VITAMINB12, FOLATE, FERRITIN, TIBC, IRON, RETICCTPCT in the last 72 hours. Urine analysis:    Component Value Date/Time   COLORURINE YELLOW (A) 06/02/2021 0018   APPEARANCEUR HAZY (A) 06/02/2021 0018   APPEARANCEUR Cloudy (A) 10/26/2016 1137   LABSPEC 1.006 06/02/2021 0018   PHURINE 7.0 06/02/2021 0018   GLUCOSEU NEGATIVE 06/02/2021 0018   HGBUR NEGATIVE 06/02/2021 0018   BILIRUBINUR NEGATIVE 06/02/2021 0018   BILIRUBINUR 1+ 11/01/2017 1630   BILIRUBINUR Negative 10/26/2016 1137   KETONESUR 5 (A) 06/02/2021 0018   PROTEINUR NEGATIVE 06/02/2021 0018   UROBILINOGEN 0.2 11/01/2017 1630   NITRITE NEGATIVE 06/02/2021 0018   LEUKOCYTESUR NEGATIVE 06/02/2021 0018   Sepsis Labs: @LABRCNTIP (procalcitonin:4,lacticacidven:4)  )No results found for this or any previous visit (from the past 240 hour(s)).       Radiology Studies: CT ABDOMEN PELVIS W CONTRAST  Result Date: 06/14/2021 CLINICAL DATA:  Abdominal pain, fever EXAM: CT ABDOMEN AND PELVIS WITH CONTRAST TECHNIQUE: Multidetector CT imaging of the abdomen and pelvis was performed using the standard protocol following bolus administration of intravenous contrast. CONTRAST:  83mL OMNIPAQUE IOHEXOL 350 MG/ML SOLN COMPARISON:  None. FINDINGS: Lower chest: Small left pleural effusion. Hepatobiliary: 8 cm cystic lesion involving the hepatic segment 2, with subcentimeter calcifications at its posterior margin.  Gallbladder is nondilated with no calcified stones evident. There is no biliary ductal dilatation. Pancreas: Unremarkable. No pancreatic ductal dilatation or surrounding inflammatory changes. Spleen: Normal in size without focal abnormality. Adrenals/Urinary Tract: Adrenal glands are unremarkable. Kidneys are normal, without renal calculi, focal lesion, or hydronephrosis. Bladder is unremarkable. Stomach/Bowel: Stomach is incompletely distended, unremarkable. The small bowel is nondistended. Normal appendix. Moderate colonic fecal material without dilatation. Vascular/Lymphatic: Scattered coarse aortic calcifications without aneurysm or stenosis. No abdominal or pelvic adenopathy. Reproductive: Uterus and bilateral adnexa are unremarkable. Other: No ascites.  No free air. Musculoskeletal: There is asymmetric enlargement of left inferior rectus muscle with regional hypoattenuation, with mild regional inflammatory/edematous changes, suggesting subacute or chronic rectus sheath hematoma, less likely mass. No definite fracture or dislocation. Patient motion during the acquisition degrades many of the images however. IMPRESSION: 1.  Asymmetric low-attenuation enlargement of the inferior aspect left rectus muscle suggesting subacute or chronic hematoma, less likely mass. Consider short-term follow-up to confirm appropriate resolution. 2. Small left pleural effusion. 3. 8 cm left hepatic cyst without complicating features. 4.  Aortic Atherosclerosis (ICD10-170.0). Electronically Signed   By: Corlis Leak M.D.   On: 06/14/2021 13:57        Scheduled Meds:  divalproex  500 mg Oral BID   DULoxetine  30 mg Oral Daily   DULoxetine  60 mg Oral Daily   enoxaparin (LOVENOX) injection  40 mg Subcutaneous Q24H   polyethylene glycol  17 g Oral Daily   ziprasidone  20 mg Oral BID WC   Continuous Infusions:  sodium chloride 100 mL/hr at 06/14/21 0916     LOS: 11 days    Time spent: 35 minutes    Alberteen Sam, MD [facility] 06/14/2021, 5:16 PM     Please page though AMION or Epic secure chat:  For [employer], Higher education careers adviser

## 2021-06-15 LAB — CBC
HCT: 29.2 % — ABNORMAL LOW (ref 36.0–46.0)
Hemoglobin: 9.7 g/dL — ABNORMAL LOW (ref 12.0–15.0)
MCH: 37.3 pg — ABNORMAL HIGH (ref 26.0–34.0)
MCHC: 33.2 g/dL (ref 30.0–36.0)
MCV: 112.3 fL — ABNORMAL HIGH (ref 80.0–100.0)
Platelets: 330 K/uL (ref 150–400)
RBC: 2.6 MIL/uL — ABNORMAL LOW (ref 3.87–5.11)
RDW: 17.7 % — ABNORMAL HIGH (ref 11.5–15.5)
WBC: 13.1 K/uL — ABNORMAL HIGH (ref 4.0–10.5)
nRBC: 1.1 % — ABNORMAL HIGH (ref 0.0–0.2)

## 2021-06-15 LAB — COMPREHENSIVE METABOLIC PANEL WITH GFR
ALT: 49 U/L — ABNORMAL HIGH (ref 0–44)
AST: 88 U/L — ABNORMAL HIGH (ref 15–41)
Albumin: 2.1 g/dL — ABNORMAL LOW (ref 3.5–5.0)
Alkaline Phosphatase: 75 U/L (ref 38–126)
Anion gap: 7 (ref 5–15)
BUN: 19 mg/dL (ref 8–23)
CO2: 26 mmol/L (ref 22–32)
Calcium: 7.7 mg/dL — ABNORMAL LOW (ref 8.9–10.3)
Chloride: 111 mmol/L (ref 98–111)
Creatinine, Ser: 1.08 mg/dL — ABNORMAL HIGH (ref 0.44–1.00)
GFR, Estimated: 58 mL/min — ABNORMAL LOW
Glucose, Bld: 93 mg/dL (ref 70–99)
Potassium: 4 mmol/L (ref 3.5–5.1)
Sodium: 144 mmol/L (ref 135–145)
Total Bilirubin: 0.9 mg/dL (ref 0.3–1.2)
Total Protein: 5.2 g/dL — ABNORMAL LOW (ref 6.5–8.1)

## 2021-06-15 LAB — HEPATITIS PANEL, ACUTE
HCV Ab: NONREACTIVE
Hep A IgM: NONREACTIVE
Hep B C IgM: NONREACTIVE
Hepatitis B Surface Ag: NONREACTIVE

## 2021-06-15 LAB — CK: Total CK: 1472 U/L — ABNORMAL HIGH (ref 38–234)

## 2021-06-15 LAB — PROCALCITONIN: Procalcitonin: 0.67 ng/mL

## 2021-06-15 MED ORDER — SODIUM CHLORIDE 0.9 % IV SOLN: INTRAVENOUS | Status: DC

## 2021-06-15 NOTE — Progress Notes (Signed)
[facility] PROGRESS NOTE    SHAMBHAVI SALLEY  ZSW:[id number] DOB: 1956-06-04 DOA: 06/01/2021 PCP: Lauro Regulus, MD      Brief Narrative:  Sabrina Johnston is a 65 y.o. F with IDD, seizures and recent E coli bacteremia who presented with falls and agitation.    Presented with weakness and malodorous urine, found to have E. coli bacteremia/sepsis due to UTI.  She was admitted for ~ 6 days, during that time, was mostly not cooperative with RN, techs, rehab and providers.  Intermittently she would cooperate, but mostly was resistant or self-injurious.  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  There, her behaviors were too difficult to manage, and so facility returned her to the hospital for placement.         Assessment & Plan:  Likely delirium At baseline, patient has limited cognitive and neurobehavioral function.  Per report from legal gaurdian, she can ambulate "some", is non-verbal, dependent but cooperative for all cares.   Since her admission for E coli sepsis, she has remained resistant, intermittently agitated, and mostly not eating.  Procal low, CT imaging unremarkable. Repeated infectious work up here remains normal.  No focal neurological deficits.  Neuro consulted, suspect this is slowly resolving delirium from her prior illness, I fully agree.  - Continue Geodon - Limit Beers meds, limit anticholinergics - Delirium precautions    Recent bacteremia E coli bacteremia is typically an easily treated and transiet infection.  Here, she has had no further fever to suggest recurrent infection.  CT abdomen and pelvis unremarkable for abscess, stone, intraabdominal infection (I blieve the rectus sheath finding is not clinically  significant)     Intellectual or developmental delay  Epilepsy No evidence of seizures.  Neurology consulted, this is clinically stable - Continue Depakote     AKI Cr up to 1.5 on 8/31 due to poor oral intake. Given IV fluids, improved - Cotninue IVF  Rhabdo Transaminitis from muscle This seems to be from agitation and constant thrashing, improved today with fluids - Continue IVF         Disposition: Status is: Inpatient  Remains inpatient appropriate because:Unsafe d/c plan  Dispo: The patient is from: Home              Anticipated d/c is to: SNF              Patient currently is not medically stable to d/c.   Difficult to place patient No       Level of care: Med-Surg       MDM: The below labs and imaging reports were reviewed and summarized above.  Medication management as above.    DVT prophylaxis: enoxaparin (LOVENOX) injection 40 mg Start: 06/07/21 2200 Place and maintain sequential compression device Start: 06/07/21 1434  Code Status: FULL Family Communication:            Subjective: No new fever, no change in mental status, no generalized tonic-clonic jerking, no respiratory distress, respiratory symptoms.  Objective: Vitals:   06/14/21 2331 06/15/21 0455 06/15/21 1057 06/15/21 1457  BP: (!) 110/50 94/77 (!) 86/72 112/76  Pulse: (!) 104 98 84 89  Resp: 18 20 19 20   Temp: 98.9 F (37.2 C) 98.2 F (36.8 C) 97.6 F (36.4 C) 97.8 F (36.6 C)  TempSrc:  Axillary Axillary  Axillary  SpO2:  96% 98% 99%  Weight:      Height:        Intake/Output Summary (Last 24 hours) at 06/15/2021 1756 Last data filed at 06/15/2021 1347 Gross per 24 hour  Intake 2167.13 ml  Output --  Net 2167.13 ml   Filed Weights   06/01/21 1856  Weight: 61.2 kg    Examination: General appearance: Adult female, awake, agitated     HEENT:    Skin:  Cardiac: RRR, no murmurs, no lower extremity edema Respiratory: Normal respiratory rate and rhythm,  lungs clear without rales or wheezes Abdomen: Abdomen soft, no grimace to palpation, no rigidity or guarding MSK:  Neuro: Awake, does not follow commands, tone increased in all 4 extremities, makes eye contact but does not follow commands, no spontaneous verbalizations other than grunting Psych: Agitated    Data Reviewed: I have personally reviewed following labs and imaging studies:  CBC: Recent Labs  Lab 06/12/21 0707 06/14/21 0713 06/15/21 0329  WBC 11.3* 11.4* 13.1*  HGB 10.2* 10.7* 9.7*  HCT 30.3* 33.1* 29.2*  MCV 110.2* 114.5* 112.3*  PLT 406* 360 330   Basic Metabolic Panel: Recent Labs  Lab 06/09/21 1258 06/10/21 1944 06/12/21 1121 06/15/21 0329  NA 142  --  140 144  K 5.2* 5.2* 3.8 4.0  CL 106  --  102 111  CO2 30  --  27 26  GLUCOSE 113*  --  117* 93  BUN 10  --  18 19  CREATININE 0.89  --  1.49* 1.08*  CALCIUM 8.4*  --  8.4* 7.7*   GFR: Estimated Creatinine Clearance: 43.6 mL/min (A) (by C-G formula based on SCr of 1.08 mg/dL (H)). Liver Function Tests: Recent Labs  Lab 06/15/21 0329  AST 88*  ALT 49*  ALKPHOS 75  BILITOT 0.9  PROT 5.2*  ALBUMIN 2.1*   No results for input(s): LIPASE, AMYLASE in the last 168 hours. No results for input(s): AMMONIA in the last 168 hours. Coagulation Profile: No results for input(s): INR, PROTIME in the last 168 hours. Cardiac Enzymes: Recent Labs  Lab 06/14/21 0713 06/15/21 0329  CKTOTAL 2,696* 1,472*   BNP (last 3 results) No results for input(s): PROBNP in the last 8760 hours. HbA1C: No results for input(s): HGBA1C in the last 72 hours. CBG: No results for input(s): GLUCAP in the last 168 hours. Lipid Profile: No results for input(s): CHOL, HDL, LDLCALC, TRIG, CHOLHDL, LDLDIRECT in the last 72 hours. Thyroid Function Tests: No results for input(s): TSH, T4TOTAL, FREET4, T3FREE, THYROIDAB in the last 72 hours. Anemia Panel: No results for input(s): VITAMINB12, FOLATE, FERRITIN, TIBC, IRON, RETICCTPCT  in the last 72 hours. Urine analysis:    Component Value Date/Time   COLORURINE YELLOW (A) 06/02/2021 0018   APPEARANCEUR HAZY (A) 06/02/2021 0018   APPEARANCEUR Cloudy (A) 10/26/2016 1137   LABSPEC 1.006 06/02/2021 0018   PHURINE 7.0 06/02/2021 0018   GLUCOSEU NEGATIVE 06/02/2021 0018   HGBUR NEGATIVE 06/02/2021 0018   BILIRUBINUR NEGATIVE 06/02/2021 0018   BILIRUBINUR 1+ 11/01/2017 1630   BILIRUBINUR Negative 10/26/2016 1137   KETONESUR 5 (A) 06/02/2021 0018   PROTEINUR NEGATIVE 06/02/2021 0018   UROBILINOGEN 0.2 11/01/2017 1630   NITRITE NEGATIVE 06/02/2021 0018   LEUKOCYTESUR NEGATIVE 06/02/2021 0018   Sepsis Labs: @LABRCNTIP (procalcitonin:4,lacticacidven:4)  ) Recent Results (from the past 240 hour(s))  CULTURE, BLOOD (ROUTINE X 2) w Reflex to ID Panel     Status: None (Preliminary result)   Collection Time:  06/14/21  8:46 AM   Specimen: BLOOD  Result Value Ref Range Status   Specimen Description BLOOD BLOOD LEFT HAND  Final   Special Requests   Final    BOTTLES DRAWN AEROBIC AND ANAEROBIC Blood Culture results may not be optimal due to an excessive volume of blood received in culture bottles   Culture   Final    NO GROWTH < 24 HOURS Performed at [facility], [address], [place], [place] [address]    Report Status PENDING  Incomplete  CULTURE, BLOOD (ROUTINE X 2) w Reflex to ID Panel     Status: None (Preliminary result)   Collection Time: 06/14/21  8:46 AM   Specimen: BLOOD  Result Value Ref Range Status   Specimen Description BLOOD BLOOD RIGHT HAND  Final   Special Requests   Final    BOTTLES DRAWN AEROBIC AND ANAEROBIC Blood Culture adequate volume   Culture   Final    NO GROWTH < 24 HOURS Performed at [facility], [address] [place], [place] [address]    Report Status PENDING  Incomplete         Radiology Studies: CT ABDOMEN PELVIS W CONTRAST  Result Date: 06/14/2021 CLINICAL DATA:  Abdominal pain, fever EXAM: CT  ABDOMEN AND PELVIS WITH CONTRAST TECHNIQUE: Multidetector CT imaging of the abdomen and pelvis was performed using the standard protocol following bolus administration of intravenous contrast. CONTRAST:  47mL OMNIPAQUE IOHEXOL 350 MG/ML SOLN COMPARISON:  None. FINDINGS: Lower chest: Small left pleural effusion. Hepatobiliary: 8 cm cystic lesion involving the hepatic segment 2, with subcentimeter calcifications at its posterior margin. Gallbladder is nondilated with no calcified stones evident. There is no biliary ductal dilatation. Pancreas: Unremarkable. No pancreatic ductal dilatation or surrounding inflammatory changes. Spleen: Normal in size without focal abnormality. Adrenals/Urinary Tract: Adrenal glands are unremarkable. Kidneys are normal, without renal calculi, focal lesion, or hydronephrosis. Bladder is unremarkable. Stomach/Bowel: Stomach is incompletely distended, unremarkable. The small bowel is nondistended. Normal appendix. Moderate colonic fecal material without dilatation. Vascular/Lymphatic: Scattered coarse aortic calcifications without aneurysm or stenosis. No abdominal or pelvic adenopathy. Reproductive: Uterus and bilateral adnexa are unremarkable. Other: No ascites.  No free air. Musculoskeletal: There is asymmetric enlargement of left inferior rectus muscle with regional hypoattenuation, with mild regional inflammatory/edematous changes, suggesting subacute or chronic rectus sheath hematoma, less likely mass. No definite fracture or dislocation. Patient motion during the acquisition degrades many of the images however. IMPRESSION: 1. Asymmetric low-attenuation enlargement of the inferior aspect left rectus muscle suggesting subacute or chronic hematoma, less likely mass. Consider short-term follow-up to confirm appropriate resolution. 2. Small left pleural effusion. 3. 8 cm left hepatic cyst without complicating features. 4.  Aortic Atherosclerosis (ICD10-170.0). Electronically Signed   By: Corlis Leak M.D.   On: 06/14/2021 13:57        Scheduled Meds:  divalproex  500 mg Oral BID   enoxaparin (LOVENOX) injection  40 mg Subcutaneous Q24H   polyethylene glycol  17 g Oral Daily   ziprasidone  20 mg Oral BID WC   Continuous Infusions:     LOS: 12 days    Time spent: 25 minutes    Alberteen Sam, MD [facility] 06/15/2021, 5:56 PM     Please page though AMION or Epic secure chat:  For [employer], Higher education careers adviser

## 2021-06-15 NOTE — Progress Notes (Signed)
No significant changes. She still has psychomotor agitation. I suspect that this is a multifactorial delirium in someone with an already significantly impaired baseline.   With treatment of her underlying medical issues, and restoration of a day/night cycle, I Would expect slow gradual improvement.   I recommend continuing to limit anticholinergic medications and would favor using scheduled neuroleptics at night(consider seroquel).   I doubt further neurodiagnostic testing would be particularly helpful at the current time. Please call with further questions or concerns.   Ritta Slot, MD [facility] [phone]  If 7pm- 7am, please page neurology on call as listed in AMION.

## 2021-06-15 NOTE — TOC Progression Note (Signed)
Transition of Care [facility]) - Progression Note    Patient Details  Name: Sabrina Johnston MRN: [id number] Date of Birth: [birth year]  [facility] CM/SW Contact  Caryn Section, RN Phone Number: 06/15/2021, 11:10 AM  Clinical Narrative:  Coordinator from [place] Must will conduct an interview with patients sister and see patient bedside.  Coordinator aware that patient is mute.  Bed offers rescinded due to sitter and behavior.  Will continue to search for beds.  TOC to follow         Expected Discharge Plan and Services                                                 Social Determinants of Health (SDOH) Interventions    Readmission Risk Interventions Readmission Risk Prevention Plan 06/06/2021  Post Dischage Appt Complete  Medication Screening Complete  Transportation Screening Complete  Some recent data might be hidden

## 2021-06-15 NOTE — Progress Notes (Signed)
Pt refused dressing on her finger

## 2021-06-16 LAB — BASIC METABOLIC PANEL
Anion gap: 7 (ref 5–15)
BUN: 19 mg/dL (ref 8–23)
CO2: 24 mmol/L (ref 22–32)
Calcium: 7.8 mg/dL — ABNORMAL LOW (ref 8.9–10.3)
Chloride: 112 mmol/L — ABNORMAL HIGH (ref 98–111)
Creatinine, Ser: 0.95 mg/dL (ref 0.44–1.00)
GFR, Estimated: >60 mL/min (ref >60)
Glucose, Bld: 88 mg/dL (ref 70–99)
Potassium: 3.9 mmol/L (ref 3.5–5.1)
Sodium: 143 mmol/L (ref 135–145)

## 2021-06-16 LAB — CK: Total CK: 559 U/L — ABNORMAL HIGH (ref 38–234)

## 2021-06-16 LAB — PROCALCITONIN: Procalcitonin: 0.26 ng/mL

## 2021-06-16 MED ORDER — BISACODYL 10 MG RE SUPP
10.0000 mg | Freq: Once | RECTAL | Status: AC
  Administered 2021-06-16: 14:00:00 10 mg via RECTAL
  Filled 2021-06-16: qty 1

## 2021-06-16 MED ORDER — FLEET ENEMA 7-19 GM/118ML RE ENEM: 1.0000 | ENEMA | Freq: Once | RECTAL | Status: DC

## 2021-06-16 MED ORDER — FLEET ENEMA 7-19 GM/118ML RE ENEM
1.0000 | ENEMA | Freq: Once | RECTAL | Status: AC
  Administered 2021-06-17: 01:00:00 1 via RECTAL

## 2021-06-16 MED ORDER — SENNOSIDES-DOCUSATE SODIUM 8.6-50 MG PO TABS
1.0000 | ORAL_TABLET | Freq: Two times a day (BID) | ORAL | Status: DC | PRN
  Administered 2021-06-16: 18:00:00 1 via ORAL
  Filled 2021-06-16: qty 1

## 2021-06-16 MED ORDER — LACTATED RINGERS IV SOLN: INTRAVENOUS | Status: DC

## 2021-06-16 MED ORDER — LACTATED RINGERS IV BOLUS
500.0000 mL | Freq: Once | INTRAVENOUS | Status: AC
  Administered 2021-06-16: 06:00:00 500 mL via INTRAVENOUS

## 2021-06-16 MED ORDER — ENSURE ENLIVE PO LIQD
237.0000 mL | Freq: Two times a day (BID) | ORAL | Status: DC
  Administered 2021-06-17 – 2021-07-11 (×46): 237 mL via ORAL

## 2021-06-16 NOTE — Progress Notes (Signed)
Refused dressing by pt on her finger.

## 2021-06-16 NOTE — Progress Notes (Signed)
Initial Nutrition Assessment  DOCUMENTATION CODES:  Not applicable  INTERVENTION:  Continue current diet as ordered, nursing staff to assist with meals Ensure Enlive po BID, each supplement provides 350 kcal and 20 grams of protein Calorie count x 48 hours to ensure adequate intake  NUTRITION DIAGNOSIS:  Inadequate oral intake related to  (altered mental status) as evidenced by  (routinely eating <75% of meals).  GOAL:  Patient will meet greater than or equal to 90% of their needs  MONITOR:  PO intake, Supplement acceptance  REASON FOR ASSESSMENT:  Consult Calorie Count  ASSESSMENT:  65 y.o. female presented to the ED with AMS and falls after being discharged back to her group home 4 hours prior to return. PMH of mental retardation, epilepsy, and chronic constipation. Pt nonverbal at baseline. Previous admission for sepsis due to UTI.  Pt laying in bed at the time of visit, curled up in blankets, sitter at bedside. Pt did not interact or speak during interview/assessment. Sitter reported she has not been with her on prior days, but intake has been poor today. Noted untouched lunch tray at bedside. Pt drinks from a sippy cup, sitter states pt has been drinking well throughout the day, thinks pt would do well with a nutrition supplement.  Discussed pt in rounds, MD requests calorie count be initiated. Placed envelope on door and discussed with RN. No in-person RD will be available to calculate this weekend, but will assess total intake on Monday. Discussed supplements with RN as well.   Obtained new bed weight while in room, stable weight and no muscle or fat deficits seen on exam. Will continue to monitor.   Average Meal Intake: 8/18-9/2: 54% intake x 18 recorded meals (0-100%)  Nutritionally Relevant Medications: Scheduled Meds:  polyethylene glycol  17 g Oral Daily   Continuous Infusions:  lactated ringers 125 mL/hr at 06/16/21 1022   PRN Meds: loperamide, ondansetron,  senna-docusate, ziprasidone  Labs Reviewed  NUTRITION - FOCUSED PHYSICAL EXAM: Flowsheet Row Most Recent Value  Orbital Region No depletion  Upper Arm Region No depletion  Thoracic and Lumbar Region No depletion  Buccal Region No depletion  Temple Region No depletion  Clavicle Bone Region No depletion  Clavicle and Acromion Bone Region No depletion  Scapular Bone Region No depletion  Dorsal Hand No depletion  Patellar Region No depletion  Anterior Thigh Region No depletion  Posterior Calf Region No depletion  Edema (RD Assessment) None  Hair Reviewed  Eyes Unable to assess  Mouth Unable to assess  Skin Reviewed  Nails Reviewed   Diet Order:   Diet Order             DIET DYS 3 Room service appropriate? Yes; Fluid consistency: Thin  Diet effective now                   EDUCATION NEEDS:  No education needs have been identified at this time  Skin:  Skin Assessment: Reviewed RN Assessment  Last BM:  8/26 per RN documentation  Height:  Ht Readings from Last 1 Encounters:  06/01/21 5' (1.524 m)    Weight:  Wt Readings from Last 1 Encounters:  06/16/21 63.6 kg    Ideal Body Weight:  45.5 kg  BMI:  Body mass index is 27.38 kg/m.  Estimated Nutritional Needs:  Kcal:  1400-1600 kcal/d Protein:  70-80 g/d Fluid:  1.5-1.8 L/d   Greig Castilla, RD, LDN Clinical Dietitian Pager on Amion

## 2021-06-16 NOTE — Progress Notes (Signed)
[facility] PROGRESS NOTE    Sabrina Johnston  ZOX:[id number] DOB: [birth year] DOA: 06/01/2021 PCP: Sabrina Regulus, MD      Brief Narrative:  Sabrina Johnston is a 65 y.o. F with IDD, seizures and recent E coli bacteremia who presented with falls and agitation.    Presented with weakness and malodorous urine, found to have E. coli bacteremia/sepsis due to UTI.  She was admitted for ~ 6 days, during that time, was mostly not cooperative with RN, techs, rehab and providers.  Intermittently she would cooperate, but mostly was resistant or self-injurious.  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  There, her behaviors were too difficult to manage, and so facility returned her to the hospital for placement.         Assessment & Plan:  Possible delirium vs baseline cognitive impairment See summary 9/1 No change today - Continue Geodon - Limit Beers meds, limit anticholinergics - Delirium precautions    Recent bacteremia E coli bacteremia is typically an easily treated and transiet infection.  Here, she has had no further fever to suggest recurrent infection.  CT abdomen and pelvis unremarkable for abscess, stone, intraabdominal infection (I blieve the rectus sheath finding is not clinically significant)     Intellectual or developmental delay  Epilepsy No seizures here.  Neurology consulted, feel her epilepsy is well controlled. - Continue Depakote     AKI Cr up to 1.5 on 8/31 due to poor oral intake. Resolved with IV fliuds.   Oral intake here low. - Continue IV fluids - Calorie count and strict I/os  Rhabdo Transaminitis from muscle This seems to be from agitation and constant thrashing, Improved - Continue IVF          Disposition: Status is: Inpatient  Remains inpatient appropriate because:Unsafe d/c plan  Dispo: The patient is from: Home              Anticipated d/c is to: SNF              Patient currently is not medically stable to d/c.   Difficult to place patient No       Level of care: Med-Surg       MDM: The below labs and imaging reports were reviewed and summarized above.  Medication management as above.    DVT prophylaxis: enoxaparin (LOVENOX) injection 40 mg Start: 06/07/21 2200 Place and maintain sequential compression device Start: 06/07/21 1434  Code Status: FULL Family Communication: sister Sabrina Johnston by phone           Subjective: No new changes.  No seizures, fever, respiratory symptoms, vomiting, diarrhea.   No urinary retention.  She is somewhat constipated  Objective: Vitals:   06/16/21 0559 06/16/21 0700 06/16/21 1100 06/16/21 1507  BP: (!) 115/50 (!) 119/106 (!) 111/46   Pulse:  76 81   Resp:  19 20   Temp:  98.2 F (36.8 C) 98.4 F (36.9 C)   TempSrc:  Axillary Axillary   SpO2:  94% 96%   Weight:    63.6 kg  Height:        Intake/Output Summary (Last 24 hours) at 06/16/2021 1619 Last data filed at 06/16/2021 1515 Gross per 24 hour  Intake 1063.84 ml  Output 180 ml  Net 883.84 ml   [employer]  06/01/21 1856 06/16/21 1507  Weight: 61.2 kg 63.6 kg    Examination: General appearance: Adult female, lying in bed, sleepy     HEENT:    Skin:  Cardiac: RRR, no murmurs, no lower extremity Respiratory: Normal respiratory rate and rhythm, lungs clear without rales or wheezes Abdomen: Abdomen soft, no grimace to palpation, no rigidity or guarding MSK:  Neuro: Sleeping, earlier today she had more arousable but was not alert to me, had increased tone in all 4 extremities, seem to be agitated and restless Psych: Nonverbal    Data Reviewed: I have personally reviewed following labs and imaging studies:  CBC: Recent Labs  Lab  06/18/21 0707 06/14/21 0713 06/15/21 0329  WBC 11.3* 11.4* 13.1*  HGB 10.2* 10.7* 9.7*  HCT 30.3* 33.1* 29.2*  MCV 110.2* 114.5* 112.3*  PLT 406* 360 330   Basic Metabolic Panel: Recent Labs  Lab 06/10/21 1944 18-Jun-2021 1121 06/15/21 0329 06/16/21 0803  NA  --  140 144 143  K 5.2* 3.8 4.0 3.9  CL  --  102 111 112*  CO2  --  27 26 24   GLUCOSE  --  117* 93 88  BUN  --  18 19 19   CREATININE  --  1.49* 1.08* 0.95  CALCIUM  --  8.4* 7.7* 7.8*   GFR: Estimated Creatinine Clearance: 50.4 mL/min (by C-G formula based on SCr of 0.95 mg/dL). Liver Function Tests: Recent Labs  Lab 06/15/21 0329  AST 88*  ALT 49*  ALKPHOS 75  BILITOT 0.9  PROT 5.2*  ALBUMIN 2.1*   No results for input(s): LIPASE, AMYLASE in the last 168 hours. No results for input(s): AMMONIA in the last 168 hours. Coagulation Profile: No results for input(s): INR, PROTIME in the last 168 hours. Cardiac Enzymes: Recent Labs  Lab 06/14/21 0713 06/15/21 0329 06/16/21 0803  CKTOTAL 2,696* 1,472* 559*   BNP (last 3 results) No results for input(s): PROBNP in the last 8760 hours. HbA1C: No results for input(s): HGBA1C in the last 72 hours. CBG: No results for input(s): GLUCAP in the last 168 hours. Lipid Profile: No results for input(s): CHOL, HDL, LDLCALC, TRIG, CHOLHDL, LDLDIRECT in the last 72 hours. Thyroid Function Tests: No results for input(s): TSH, T4TOTAL, FREET4, T3FREE, THYROIDAB in the last 72 hours. Anemia Panel: No results for input(s): VITAMINB12, FOLATE, FERRITIN, TIBC, IRON, RETICCTPCT in the last 72 hours. Urine analysis:    Component Value Date/Time   COLORURINE YELLOW (A) 06/02/2021 0018   APPEARANCEUR HAZY (A) 06/02/2021 0018   APPEARANCEUR Cloudy (A) 10/26/2016 1137   LABSPEC 1.006 06/02/2021 0018   PHURINE 7.0 06/02/2021 0018   GLUCOSEU NEGATIVE 06/02/2021 0018   HGBUR NEGATIVE 06/02/2021 0018   BILIRUBINUR NEGATIVE 06/02/2021 0018   BILIRUBINUR 1+ 11/01/2017 1630    BILIRUBINUR Negative 10/26/2016 1137   KETONESUR 5 (A) 06/02/2021 0018   PROTEINUR NEGATIVE 06/02/2021 0018   UROBILINOGEN 0.2 11/01/2017 1630   NITRITE NEGATIVE 06/02/2021 0018   LEUKOCYTESUR NEGATIVE 06/02/2021 0018   Sepsis Labs: @LABRCNTIP (procalcitonin:4,lacticacidven:4)  ) Recent Results (from the past 240 hour(s))  CULTURE, BLOOD (ROUTINE X 2) w Reflex to ID Panel     Status: None (Preliminary result)   Collection Time: 06/14/21  8:46 AM   Specimen: BLOOD  Result Value Ref Range Status   Specimen Description BLOOD BLOOD LEFT HAND  Final   Special Requests   Final    BOTTLES DRAWN AEROBIC AND ANAEROBIC Blood Culture results may not be optimal due to an excessive  volume of blood received in culture bottles   Culture   Final    NO GROWTH 2 DAYS Performed at [facility], [address] [place], [place] [address]    Report Status PENDING  Incomplete  CULTURE, BLOOD (ROUTINE X 2) w Reflex to ID Panel     Status: None (Preliminary result)   Collection Time: 06/14/21  8:46 AM   Specimen: BLOOD  Result Value Ref Range Status   Specimen Description BLOOD BLOOD RIGHT HAND  Final   Special Requests   Final    BOTTLES DRAWN AEROBIC AND ANAEROBIC Blood Culture adequate volume   Culture   Final    NO GROWTH 2 DAYS Performed at [facility], [address] [place], [place] [address]    Report Status PENDING  Incomplete         Radiology Studies: No results found.      Scheduled Meds:  divalproex  500 mg Oral BID   enoxaparin (LOVENOX) injection  40 mg Subcutaneous Q24H   [START ON 06/17/2021] feeding supplement  237 mL Oral BID BM   polyethylene glycol  17 g Oral Daily   ziprasidone  20 mg Oral BID WC   Continuous Infusions:  lactated ringers 125 mL/hr at 06/16/21 1515      LOS: 13 days    Time spent: 25 minutes    Alberteen Sam, MD [facility] 06/16/2021, 4:19 PM     Please page though AMION or Epic secure chat:  For  [employer], Higher education careers adviser

## 2021-06-16 NOTE — TOC Progression Note (Signed)
Transition of Care [facility]) - Progression Note    Patient Details  Name: ZANIAH TITTERINGTON MRN: [id number] Date of Birth: 1956-06-16  Transition of Care [facility]) CM/SW Contact  Caryn Section, RN Phone Number: 06/16/2021, 3:19 PM  Clinical Narrative:   RNCM left a message with facility to inquire if they can take patient back.  Awaiting response.           Expected Discharge Plan and Services                                                 Social Determinants of Health (SDOH) Interventions    Readmission Risk Interventions Readmission Risk Prevention Plan 06/06/2021  Post Dischage Appt Complete  Medication Screening Complete  Transportation Screening Complete  Some recent data might be hidden

## 2021-06-17 LAB — BASIC METABOLIC PANEL
Anion gap: 7 (ref 5–15)
BUN: 14 mg/dL (ref 8–23)
CO2: 24 mmol/L (ref 22–32)
Calcium: 8.3 mg/dL — ABNORMAL LOW (ref 8.9–10.3)
Chloride: 115 mmol/L — ABNORMAL HIGH (ref 98–111)
Creatinine, Ser: 0.8 mg/dL (ref 0.44–1.00)
GFR, Estimated: >60 mL/min (ref >60)
Glucose, Bld: 88 mg/dL (ref 70–99)
Potassium: 4.1 mmol/L (ref 3.5–5.1)
Sodium: 146 mmol/L — ABNORMAL HIGH (ref 135–145)

## 2021-06-17 LAB — URINE CULTURE: Culture: <10000 — AB

## 2021-06-17 MED ORDER — SENNOSIDES-DOCUSATE SODIUM 8.6-50 MG PO TABS
2.0000 | ORAL_TABLET | Freq: Two times a day (BID) | ORAL | Status: DC
  Administered 2021-06-17 – 2021-07-07 (×33): 2 via ORAL
  Administered 2021-07-08: 21:00:00 1 via ORAL
  Administered 2021-07-08 – 2021-07-12 (×7): 2 via ORAL
  Administered 2021-07-12: 22:00:00 1 via ORAL
  Administered 2021-07-13 – 2021-07-31 (×37): 2 via ORAL
  Filled 2021-06-17 (×78): qty 2

## 2021-06-17 NOTE — Progress Notes (Signed)
[facility] PROGRESS NOTE    Sabrina Johnston  GQQ:[id number] DOB: 12/17/1955 DOA: 06/01/2021 PCP: Sabrina Regulus, MD      Brief Narrative:  Sabrina Johnston is a 65 y.o. F with IDD, seizures and recent E coli bacteremia who presented with falls and agitation.    Presented with weakness and malodorous urine, found to have E. coli bacteremia/sepsis due to UTI.  She was admitted for ~ 6 days, during that time, was mostly not cooperative with RN, techs, rehab and providers.  Intermittently she would cooperate, but mostly was resistant or self-injurious.  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  There, her behaviors were too difficult to manage, and so facility returned her to the hospital for placement.         Assessment & Plan:  Possible delirium vs baseline cognitive impairment See summary 9/1 No change today - Continue Geodon - Limit Beers meds, limit anticholinergics - Delirium precautions   Intellectual or developmental delay  Epilepsy No seizures - Continue Depakote     AKI Cr up to 1.5 on 8/31 due to poor oral intake. Resolved with IV fliuds.   - Stop fluids - Calorie count and strict I/os     Rhabdo Transaminitis from muscle Resolved   Chronic constipation Enema yesterday. Miralax not working. - Restart home Senna     Disposition: Status is: Inpatient  Remains inpatient appropriate because:Unsafe d/c plan  Dispo: The patient is from: Home              Anticipated d/c is to: SNF              Patient currently is not medically stable to d/c.   Difficult to place patient No       Level of care: Med-Surg       MDM: The below labs and imaging reports were reviewed and summarized above.   Medication management as above.    DVT prophylaxis: enoxaparin (LOVENOX) injection 40 mg Start: 06/07/21 2200 Place and maintain sequential compression device Start: 06/07/21 1434  Code Status: FULL Family Communication:             Subjective: No change, no fever, seizures, vomiting, respiratory symptoms, diarrhea, urinary retention.  Able to have a bowel movement yesterday after enema.         Objective: Vitals:   06/16/21 2019 06/17/21 0032 06/17/21 0402 06/17/21 1241  BP: (!) 126/104 (!) 139/115 (!) 134/112 (!) 153/76  Pulse: 77 (!) 103 89 94  Resp:  20 18   Temp:  (!) 97.5 F (36.4 C) (!) 96.2 F (35.7 C)   TempSrc:  Axillary Axillary   SpO2:  97% 100% 99%  Weight:      Height:        Intake/Output Summary (Last 24 hours) at 06/17/2021 1256 Last data filed at 06/17/2021 1223 Gross per 24 hour  Intake 3315.37 ml  Output 10 ml  Net 3305.37 ml   [employer]   06/01/21 1856 06/16/21 1507  Weight: 61.2 kg 63.6 kg    Examination: General appearance: Adult female, lying in bed, holding a stuffed animal, does not make eye contact    HEENT:    Skin:  Cardiac: RRR, no murmurs, no lower extremity edema  Respiratory: Normal respiratory rate and rhythm, lungs clear without rales  or wheezes Abdomen: Abdomen soft, no grimace to palpation, no rigidity or guarding MSK:  Neuro: Restless, increased tone in all 4 extremities, somewhat agitated Psych: Nonverbal    Data Reviewed: I have personally reviewed following labs and imaging studies:  CBC: Recent Labs  Lab 06/12/21 0707 06/14/21 0713 06/15/21 0329  WBC 11.3* 11.4* 13.1*  HGB 10.2* 10.7* 9.7*  HCT 30.3* 33.1* 29.2*  MCV 110.2* 114.5* 112.3*  PLT 406* 360 330   Basic Metabolic Panel: Recent Labs  Lab 06/10/21 1944 06/12/21 1121 06/15/21 0329 06/16/21 0803 06/17/21 0612  NA  --  140 144 143 146*  K 5.2* 3.8 4.0 3.9 4.1  CL  --  102 111 112* 115*  CO2  --  27 26 24 24   GLUCOSE  --  117* 93  88 88  BUN  --  18 19 19 14   CREATININE  --  1.49* 1.08* 0.95 0.80  CALCIUM  --  8.4* 7.7* 7.8* 8.3*   GFR: Estimated Creatinine Clearance: 59.9 mL/min (by C-G formula based on SCr of 0.8 mg/dL). Liver Function Tests: Recent Labs  Lab 06/15/21 0329  AST 88*  ALT 49*  ALKPHOS 75  BILITOT 0.9  PROT 5.2*  ALBUMIN 2.1*   No results for input(s): LIPASE, AMYLASE in the last 168 hours. No results for input(s): AMMONIA in the last 168 hours. Coagulation Profile: No results for input(s): INR, PROTIME in the last 168 hours. Cardiac Enzymes: Recent Labs  Lab 06/14/21 0713 06/15/21 0329 06/16/21 0803  CKTOTAL 2,696* 1,472* 559*   BNP (last 3 results) No results for input(s): PROBNP in the last 8760 hours. HbA1C: No results for input(s): HGBA1C in the last 72 hours. CBG: No results for input(s): GLUCAP in the last 168 hours. Lipid Profile: No results for input(s): CHOL, HDL, LDLCALC, TRIG, CHOLHDL, LDLDIRECT in the last 72 hours. Thyroid Function Tests: No results for input(s): TSH, T4TOTAL, FREET4, T3FREE, THYROIDAB in the last 72 hours. Anemia Panel: No results for input(s): VITAMINB12, FOLATE, FERRITIN, TIBC, IRON, RETICCTPCT in the last 72 hours. Urine analysis:    Component Value Date/Time   COLORURINE YELLOW (A) 06/02/2021 0018   APPEARANCEUR HAZY (A) 06/02/2021 0018   APPEARANCEUR Cloudy (A) 10/26/2016 1137   LABSPEC 1.006 06/02/2021 0018   PHURINE 7.0 06/02/2021 0018   GLUCOSEU NEGATIVE 06/02/2021 0018   HGBUR NEGATIVE 06/02/2021 0018   BILIRUBINUR NEGATIVE 06/02/2021 0018   BILIRUBINUR 1+ 11/01/2017 1630   BILIRUBINUR Negative 10/26/2016 1137   KETONESUR 5 (A) 06/02/2021 0018   PROTEINUR NEGATIVE 06/02/2021 0018   UROBILINOGEN 0.2 11/01/2017 1630   NITRITE NEGATIVE 06/02/2021 0018   LEUKOCYTESUR NEGATIVE 06/02/2021 0018   Sepsis Labs: @LABRCNTIP (procalcitonin:4,lacticacidven:4)  ) Recent Results (from the past 240 hour(s))  CULTURE, BLOOD (ROUTINE X 2) w  Reflex to ID Panel     Status: None (Preliminary result)   Collection Time: 06/14/21  8:46 AM   Specimen: BLOOD  Result Value Ref Range Status   Specimen Description BLOOD BLOOD LEFT HAND  Final   Special Requests   Final    BOTTLES DRAWN AEROBIC AND ANAEROBIC Blood Culture results may not be optimal due to an excessive volume of blood received in culture bottles   Culture   Final    NO GROWTH 3 DAYS Performed at [facility], [address] [place], [facility] [address]    Report Status PENDING  Incomplete  CULTURE, BLOOD (ROUTINE X 2) w Reflex to ID Panel     Status:  None (Preliminary result)   Collection Time: 06/14/21  8:46 AM   Specimen: BLOOD  Result Value Ref Range Status   Specimen Description BLOOD BLOOD RIGHT HAND  Final   Special Requests   Final    BOTTLES DRAWN AEROBIC AND ANAEROBIC Blood Culture adequate volume   Culture   Final    NO GROWTH 3 DAYS Performed at [facility], [address] [place], [place] [address]    Report Status PENDING  Incomplete         Radiology Studies: No results found.      Scheduled Meds:  divalproex  500 mg Oral BID   enoxaparin (LOVENOX) injection  40 mg Subcutaneous Q24H   feeding supplement  237 mL Oral BID BM   polyethylene glycol  17 g Oral Daily   ziprasidone  20 mg Oral BID WC   Continuous Infusions:  lactated ringers 125 mL/hr at 06/17/21 1223      LOS: 14 days    Time spent: 25 minutes    Alberteen Sam, MD [facility] 06/17/2021, 12:56 PM     Please page though AMION or Epic secure chat:  For [employer], Higher education careers adviser

## 2021-06-17 NOTE — TOC Progression Note (Addendum)
Transition of Care [facility]) - Progression Note    Patient Details  Name: Sabrina Johnston MRN: [id number] Date of Birth: 02/14/1956  [facility]) CM/SW Contact  Ashley Royalty Lutricia Feil, RN Phone Number:[phone] 06/17/2021, 3:44 PM  Clinical Narrative:    Spoke with sister [name]) with bed offers for possible SNF placement. Sister inquiring on information concerning the facilities. RN provider [email or web address] website for details on the facility.In addition if she would like to call the facility directly with any inquires. Sister reviewed and decided to go with [facility]. RN called several number for [facility] however all unsuccessful.  Rosey Bath [phone] Unable to leave a message (mailbox full) Raphael Gibney [phone] Left Message [phone] no answer and unable to leave a message  Will remain available for discharge needs. TOC will continue to reach out to accept the bed offer with [facility].       Expected Discharge Plan and Services                                                 Social Determinants of Health (SDOH) Interventions    Readmission Risk Interventions Readmission Risk Prevention Plan 06/06/2021  Post Dischage Appt Complete  Medication Screening Complete  Transportation Screening Complete  Some recent data might be hidden

## 2021-06-17 NOTE — Progress Notes (Signed)
Patient ate 100% of dinner.  She swallowed without difficulty, mush potatoes, meatloaf and broccoli with gravy.

## 2021-06-18 LAB — BASIC METABOLIC PANEL
Anion gap: 4 — ABNORMAL LOW (ref 5–15)
BUN: 12 mg/dL (ref 8–23)
CO2: 28 mmol/L (ref 22–32)
Calcium: 7.7 mg/dL — ABNORMAL LOW (ref 8.9–10.3)
Chloride: 110 mmol/L (ref 98–111)
Creatinine, Ser: 0.76 mg/dL (ref 0.44–1.00)
GFR, Estimated: >60 mL/min (ref >60)
Glucose, Bld: 91 mg/dL (ref 70–99)
Potassium: 3.4 mmol/L — ABNORMAL LOW (ref 3.5–5.1)
Sodium: 142 mmol/L (ref 135–145)

## 2021-06-18 LAB — CK: Total CK: 152 U/L (ref 38–234)

## 2021-06-18 NOTE — Progress Notes (Signed)
[facility] PROGRESS NOTE    Sabrina Johnston  PTW:[id number] DOB: 1956-02-02 DOA: 06/01/2021 PCP: Lauro Regulus, MD      Brief Narrative:  Sabrina Johnston is a 65 y.o. F with IDD, seizures and recent E coli bacteremia who presented with falls and agitation.    Presented with weakness and malodorous urine, found to have E. coli bacteremia/sepsis due to UTI.  She was admitted for ~ 6 days, during that time, was mostly not cooperative with RN, techs, rehab and providers.  Intermittently she would cooperate, but mostly was resistant or self-injurious.  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  There, her behaviors were too difficult to manage, and so facility returned her to the hospital for placement.         Assessment & Plan:  Possible delirium vs baseline cognitive impairment See summary 9/1 No change today - Continue Geodon - Limit Beers meds, limit anticholinergics - Delirium precautions   Intellectual or developmental delay  Epilepsy No seizures - Continue Depakote     AKI Cr up to 1.5 on 8/31 due to poor oral intake. Resolved with IV fliuds.   - Stop fluids - Calorie count and strict I/os     Rhabdo Transaminitis from muscle Resolved   Chronic constipation Enema yesterday. Miralax not working. - Restart home Senna     Disposition: Status is: Inpatient  Remains inpatient appropriate because:Unsafe d/c plan  Dispo: The patient is from: Home              Anticipated d/c is to: SNF              Patient currently is not medically stable to d/c.   Difficult to place patient No       Level of care: Med-Surg       MDM: The below labs and imaging reports were reviewed and summarized above.   Medication management as above.    DVT prophylaxis: enoxaparin (LOVENOX) injection 40 mg Start: 06/07/21 2200 Place and maintain sequential compression device Start: 06/07/21 1434  Code Status: FULL Family Communication:             Subjective: No change, no fever, seizures, vomiting, respiratory symptoms, diarrhea, urinary retention.  Able to have a bowel movement yesterday after enema.         Objective: Vitals:   06/18/21 0832 06/18/21 0837 06/18/21 1100 06/18/21 1146  BP: (!) 145/94   126/83  Pulse: 96   94  Resp: 20   18  Temp: 97.9 F (36.6 C)   (!) 97.2 F (36.2 C)  TempSrc:    Axillary  SpO2: (!) 80% 97% 98% 97%  Weight:      Height:        Intake/Output Summary (Last 24 hours) at 06/18/2021 1214 Last data filed at 06/18/2021 1039 Gross per 24 hour  Intake 2462.62 ml  Output --  Net 2462.62 ml   Filed Weights   06/01/21 1856 06/16/21 1507  Weight: 61.2 kg 63.6 kg    Examination: General appearance: Adult female, lying in bed, holding a stuffed animal, does not make eye contact    HEENT:    Skin:  Cardiac: RRR, no murmurs, no lower extremity edema  Respiratory: Normal respiratory rate and rhythm, lungs clear without rales or wheezes Abdomen: Abdomen soft,  no grimace to palpation, no rigidity or guarding MSK:  Neuro: Restless, increased tone in all 4 extremities, somewhat agitated Psych: Nonverbal    Data Reviewed: I have personally reviewed following labs and imaging studies:  CBC: Recent Labs  Lab 06/12/21 0707 06/14/21 0713 06/15/21 0329  WBC 11.3* 11.4* 13.1*  HGB 10.2* 10.7* 9.7*  HCT 30.3* 33.1* 29.2*  MCV 110.2* 114.5* 112.3*  PLT 406* 360 330   Basic Metabolic Panel: Recent Labs  Lab 06/12/21 1121 06/15/21 0329 06/16/21 0803 06/17/21 0612 06/18/21 0608  NA 140 144 143 146* 142  K 3.8 4.0 3.9 4.1 3.4*  CL 102 111 112* 115* 110  CO2 27 26 24 24 28   GLUCOSE 117* 93 88 88 91  BUN 18 19 19 14 12   CREATININE 1.49* 1.08*  0.95 0.80 0.76  CALCIUM 8.4* 7.7* 7.8* 8.3* 7.7*   GFR: Estimated Creatinine Clearance: 59.9 mL/min (by C-G formula based on SCr of 0.76 mg/dL). Liver Function Tests: Recent Labs  Lab 06/15/21 0329  AST 88*  ALT 49*  ALKPHOS 75  BILITOT 0.9  PROT 5.2*  ALBUMIN 2.1*   No results for input(s): LIPASE, AMYLASE in the last 168 hours. No results for input(s): AMMONIA in the last 168 hours. Coagulation Profile: No results for input(s): INR, PROTIME in the last 168 hours. Cardiac Enzymes: Recent Labs  Lab 06/14/21 0713 06/15/21 0329 06/16/21 0803 06/18/21 0608  CKTOTAL 2,696* 1,472* 559* 152   BNP (last 3 results) No results for input(s): PROBNP in the last 8760 hours. HbA1C: No results for input(s): HGBA1C in the last 72 hours. CBG: No results for input(s): GLUCAP in the last 168 hours. Lipid Profile: No results for input(s): CHOL, HDL, LDLCALC, TRIG, CHOLHDL, LDLDIRECT in the last 72 hours. Thyroid Function Tests: No results for input(s): TSH, T4TOTAL, FREET4, T3FREE, THYROIDAB in the last 72 hours. Anemia Panel: No results for input(s): VITAMINB12, FOLATE, FERRITIN, TIBC, IRON, RETICCTPCT in the last 72 hours. Urine analysis:    Component Value Date/Time   COLORURINE YELLOW (A) 06/02/2021 0018   APPEARANCEUR HAZY (A) 06/02/2021 0018   APPEARANCEUR Cloudy (A) 10/26/2016 1137   LABSPEC 1.006 06/02/2021 0018   PHURINE 7.0 06/02/2021 0018   GLUCOSEU NEGATIVE 06/02/2021 0018   HGBUR NEGATIVE 06/02/2021 0018   BILIRUBINUR NEGATIVE 06/02/2021 0018   BILIRUBINUR 1+ 11/01/2017 1630   BILIRUBINUR Negative 10/26/2016 1137   KETONESUR 5 (A) 06/02/2021 0018   PROTEINUR NEGATIVE 06/02/2021 0018   UROBILINOGEN 0.2 11/01/2017 1630   NITRITE NEGATIVE 06/02/2021 0018   LEUKOCYTESUR NEGATIVE 06/02/2021 0018   Sepsis Labs: @LABRCNTIP (procalcitonin:4,lacticacidven:4)  ) Recent Results (from the past 240 hour(s))  CULTURE, BLOOD (ROUTINE X 2) w Reflex to ID Panel     Status:  None (Preliminary result)   Collection Time: 06/14/21  8:46 AM   Specimen: BLOOD  Result Value Ref Range Status   Specimen Description BLOOD BLOOD LEFT HAND  Final   Special Requests   Final    BOTTLES DRAWN AEROBIC AND ANAEROBIC Blood Culture results may not be optimal due to an excessive volume of blood received in culture bottles   Culture   Final    NO GROWTH 3 DAYS Performed at [facility], [address] [place], [facility] [address]    Report Status PENDING  Incomplete  CULTURE, BLOOD (ROUTINE X 2) w Reflex to ID Panel     Status: None (Preliminary result)   Collection Time: 06/14/21  8:46 AM   Specimen: BLOOD  Result Value Ref Range Status   Specimen Description BLOOD BLOOD RIGHT HAND  Final   Special Requests   Final    BOTTLES DRAWN AEROBIC AND ANAEROBIC Blood Culture adequate volume   Culture   Final    NO GROWTH 3 DAYS Performed at [facility], [address], [place], [place] [address]    Report Status PENDING  Incomplete  Urine Culture     Status: Abnormal   Collection Time: 06/14/21  6:19 PM   Specimen: Urine, Catheterized  Result Value Ref Range Status   Specimen Description   Final    URINE, CATHETERIZED Performed at [facility], [address], [place], [place] [address]    Special Requests   Final    NONE Performed at [facility], [address], [place], [place] [address]    Culture (A)  Final    <10,000 COLONIES/mL INSIGNIFICANT GROWTH Performed at [facility], [address] [place], [place] [address]    Report Status 06/17/2021 FINAL  Final         Radiology Studies: No results found.      Scheduled Meds:  divalproex  500 mg Oral BID   enoxaparin (LOVENOX) injection  40 mg Subcutaneous Q24H   feeding supplement  237 mL Oral BID BM   senna-docusate  2 tablet Oral BID   ziprasidone  20 mg Oral BID WC   Continuous Infusions:      LOS: 15 days    Time spent: 25  minutes    Alberteen Sam, MD [facility] 06/18/2021, 12:14 PM     Please page though AMION or Epic secure chat:  For [employer], Higher education careers adviser

## 2021-06-19 LAB — CULTURE, BLOOD (ROUTINE X 2)
Culture: NO GROWTH
Culture: NO GROWTH
Special Requests: ADEQUATE

## 2021-06-19 NOTE — Progress Notes (Signed)
Calorie Count Note  48 hour calorie count ordered.  Diet: DYS 3, thin liquids Supplements: Ensure Enlive BID  Lunch 9/3: 0% intake of meal Dinner 9/3: 100% intake of meal, 591 kcal, 20g of protein Breakfast 9/4: 450kcal, 6g of protein   Lunch 9/4: 85% intake of meal, 585 kcal, 17g of protein  Supplements: 2 ensure enlives recorded as consumed each day which adds an additional 700kcal, 40g of protein per day  Estimated Nutritional Needs:  Kcal:  1400-1600 kcal/d Protein:  70-80 g/d Fluid:  1.5-1.8 L/d  Total intake Day 1: 1741 kcal (>100% of minimum estimated needs)  66 protein (94% of minimum estimated needs)  Total Intake Day 2 (two meals not recorded: 1285 kcal (92% of minimum estimated needs)  57 protein (81% of minimum estimated needs)  Reviewed meal tickets from 9/3-9/5. Not all tickets present in envelope, but pt appears to have good intake of most meals. Ensure was added 9/3 with good acceptance. Pt appears to be meeting or exceeding >90% of estimated needs. No deficits detected on physical exam on 9/3. No concersnw with nutrition intake at this time.   NUTRITION DIAGNOSIS:  Inadequate oral intake related to  (altered mental status) as evidenced by  (routinely eating <75% of meals).   GOAL:  Patient will meet greater than or equal to 90% of their needs  INTERVENTION:  Continue current diet as ordered, nursing staff to assist with meals Ensure Enlive po BID, each supplement provides 350 kcal and 20 grams of protein  Greig Castilla, RD, LDN Clinical Dietitian Pager on Amion

## 2021-06-19 NOTE — Progress Notes (Signed)
[facility] PROGRESS NOTE    Sabrina Johnston  FUX:[id number] DOB: May 16, 1956 DOA: 06/01/2021 PCP: Lauro Regulus, MD      Brief Narrative:  Sabrina Johnston is a 65 y.o. F with IDD, seizures and recent E coli bacteremia who presented with falls and agitation.    Presented with weakness and malodorous urine, found to have E. coli bacteremia/sepsis due to UTI.  She was admitted for ~ 6 days, during that time, was mostly not cooperative with RN, techs, rehab and providers.  Intermittently she would cooperate, but mostly was resistant or self-injurious.  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  There, her behaviors were too difficult to manage, and so facility returned her to the hospital for placement.         Assessment & Plan:  Delirium superimposed on very advanced cognitive impairment at baseline See summary 9/1 No change - Continue Geodon - Limit Beers meds, limit anticholinergics - Delirium precautions -Follow-up calorie count  Intellectual or developmental delay  Epilepsy No seizures overnight - Continue Depakote     AKI Cr up to 1.5 on 8/31 due to poor oral intake. Resolved with IV fliuds.      Anemia, macrocytic Unclear cause. B12 normal.  TSH slightly high but T3, free T4 normal. - Outpatient CBC in 1 month  Rhabdo Transaminitis from muscle This was due to "thrashing in bed" resolved   Chronic constipation Required enema the other day - Continue home senna     Disposition: Status is: Inpatient  Remains inpatient appropriate because:Unsafe d/c plan  Dispo: The patient is from: Home              Anticipated d/c is to: SNF              Patient currently is medically stable to d/c.   Difficult to place  patient No   evel of care: Med-Surg  The patient was admitted for agitation, Ureaphil no neurological nor metabolic cause of her change in behavior.  We suspect that the delirium during her recent bacteremia is caused worsening of her already extremely limited cognitive function, and behaviors visible now or simply result of her developmental delay, worsening cognitive function, and that she now needs skilled nursing level of care.  Placement pending.     MDM: The below labs and imaging reports were reviewed and summarized above.  Medication management as above.    DVT prophylaxis: enoxaparin (LOVENOX) injection 40 mg Start: 06/07/21 2200 Place and maintain sequential compression device Start: 06/07/21 1434  Code Status: FULL Family Communication:             Subjective: No fever, vomiting, respiratory symptoms, seizures.  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  No nasal deformity, discharge, epistaxis. Skin: No suspicious rashes or lesions.  The burn on her right finger appears to have had a little reaction to Vaseline gauze, but does not have surrounding redness, swelling to suggest cellulitis. Cardiac: RRR, no murmurs, no lower extremity  edema Respiratory: Tory rate and rhythm, lungs clear without rales or wheezes Abdomen: Voluntary guarding, no grimace to palpation, no rigidity, no rebound, no distention, no ascites MSK:  Neuro: Awake, restless, increased tone in all 4 extremities, but mostly just resisting movement, agitated, no spontaneous verbalization     Data Reviewed: I have personally reviewed following labs and imaging studies:  CBC: Recent Labs  Lab 06/14/21 0713 06/15/21 0329  WBC 11.4* 13.1*  HGB 10.7* 9.7*  HCT 33.1* 29.2*  MCV 114.5* 112.3*  PLT 360 330   Basic Metabolic Panel: Recent Labs  Lab 06/12/21 1121 06/15/21 0329 06/16/21 0803 06/17/21 0612 06/18/21 0608  NA 140 144 143 146* 142  K 3.8 4.0 3.9 4.1 3.4*  CL 102 111 112* 115* 110  CO2 27 26 24 24 28   GLUCOSE 117* 93 88 88 91  BUN 18 19 19 14 12   CREATININE 1.49* 1.08* 0.95 0.80 0.76  CALCIUM 8.4* 7.7* 7.8* 8.3* 7.7*   GFR: Estimated Creatinine Clearance: 59.9 mL/min (by C-G formula based on SCr of 0.76 mg/dL). Liver Function Tests: Recent Labs  Lab 06/15/21 0329  AST 88*  ALT 49*  ALKPHOS 75  BILITOT 0.9  PROT 5.2*  ALBUMIN 2.1*   No results for input(s): LIPASE, AMYLASE in the last 168 hours. No results for input(s): AMMONIA in the last 168 hours. Coagulation Profile: No results for input(s): INR, PROTIME in the last 168 hours. Cardiac Enzymes: Recent Labs  Lab 06/14/21 0713 06/15/21 0329 06/16/21 0803 06/18/21 0608  CKTOTAL 2,696* 1,472* 559* 152   BNP (last 3 results) No results for input(s): PROBNP in the last 8760 hours. HbA1C: No results for input(s): HGBA1C in the last 72 hours. CBG: No results for input(s): GLUCAP in the last 168 hours. Lipid Profile: No results for input(s): CHOL, HDL, LDLCALC, TRIG, CHOLHDL, LDLDIRECT in the last 72 hours. Thyroid Function Tests: No results for input(s): TSH, T4TOTAL, FREET4, T3FREE, THYROIDAB in the last 72 hours. Anemia Panel: No results for input(s):  VITAMINB12, FOLATE, FERRITIN, TIBC, IRON, RETICCTPCT in the last 72 hours. Urine analysis:    Component Value Date/Time   COLORURINE YELLOW (A) 06/02/2021 0018   APPEARANCEUR HAZY (A) 06/02/2021 0018   APPEARANCEUR Cloudy (A) 10/26/2016 1137   LABSPEC 1.006 06/02/2021 0018   PHURINE 7.0 06/02/2021 0018   GLUCOSEU NEGATIVE 06/02/2021 0018   HGBUR NEGATIVE 06/02/2021 0018   BILIRUBINUR NEGATIVE 06/02/2021 0018   BILIRUBINUR 1+ 11/01/2017 1630   BILIRUBINUR Negative 10/26/2016 1137   KETONESUR 5 (A) 06/02/2021 0018   PROTEINUR NEGATIVE 06/02/2021 0018   UROBILINOGEN 0.2 11/01/2017 1630   NITRITE NEGATIVE 06/02/2021 0018   LEUKOCYTESUR NEGATIVE 06/02/2021 0018   Sepsis Labs: @LABRCNTIP (procalcitonin:4,lacticacidven:4)  ) Recent Results (from the past 240 hour(s))  CULTURE, BLOOD (ROUTINE X 2) w Reflex to ID Panel     Status: None   Collection Time: 06/14/21  8:46 AM   Specimen: BLOOD  Result Value Ref Range Status   Specimen Description BLOOD BLOOD LEFT HAND  Final   Special Requests   Final    BOTTLES DRAWN AEROBIC AND ANAEROBIC Blood Culture results may not be optimal due to an excessive volume of blood received in culture bottles   Culture   Final    NO GROWTH 5 DAYS Performed at [facility], [address] [place], [place] [address]    Report Status 06/19/2021 FINAL  Final  CULTURE, BLOOD (ROUTINE X 2) w Reflex to ID Panel     Status: None   Collection Time: 06/14/21  8:46 AM   Specimen: BLOOD  Result Value Ref Range Status   Specimen Description BLOOD BLOOD RIGHT HAND  Final   Special Requests   Final    BOTTLES DRAWN AEROBIC AND ANAEROBIC Blood Culture adequate volume   Culture   Final    NO GROWTH 5 DAYS Performed at [facility], [address] [place], [place] [address]    Report Status 06/19/2021 FINAL  Final  Urine Culture     Status: Abnormal   Collection Time: 06/14/21  6:19 PM   Specimen: Urine, Catheterized  Result Value Ref Range  Status   Specimen Description   Final    URINE, CATHETERIZED Performed at [facility], [address], [place], [place] [address]    Special Requests   Final    NONE Performed at [facility], [address], [place], [place] [address]    Culture (A)  Final    <10,000 COLONIES/mL INSIGNIFICANT GROWTH Performed at [facility], [address], [place], [place] [address]    Report Status 06/17/2021 FINAL  Final         Radiology Studies: No results found.      Scheduled Meds:  divalproex  500 mg Oral BID   enoxaparin (LOVENOX) injection  40 mg Subcutaneous Q24H   feeding supplement  237 mL Oral BID BM   senna-docusate  2 tablet Oral BID   ziprasidone  20 mg Oral BID WC   Continuous Infusions:      LOS: 16 days    Time spent: 25 minutes    Alberteen Sam, MD [facility] 06/19/2021, 9:32 AM     Please page though AMION or Epic secure chat:  For [employer], Higher education careers adviser

## 2021-06-20 LAB — CBC
HCT: 34.5 % — ABNORMAL LOW (ref 36.0–46.0)
Hemoglobin: 11.9 g/dL — ABNORMAL LOW (ref 12.0–15.0)
MCH: 39.1 pg — ABNORMAL HIGH (ref 26.0–34.0)
MCHC: 34.5 g/dL (ref 30.0–36.0)
MCV: 113.5 fL — ABNORMAL HIGH (ref 80.0–100.0)
Platelets: 366 10*3/uL (ref 150–400)
RBC: 3.04 MIL/uL — ABNORMAL LOW (ref 3.87–5.11)
RDW: 18.6 % — ABNORMAL HIGH (ref 11.5–15.5)
WBC: 9.4 10*3/uL (ref 4.0–10.5)
nRBC: 0.7 % — ABNORMAL HIGH (ref 0.0–0.2)

## 2021-06-20 NOTE — TOC Progression Note (Addendum)
Transition of Care [facility]) - Progression Note    Patient Details  Name: MYNDI WAMBLE MRN: [id number] Date of Birth: [birth year]  [facility] CM/SW Contact  Caryn Section, RN Phone Number: 06/20/2021, 2:36 PM  Clinical Narrative:   [facility] a [facility].  They continue to recommend SNF rehab for patient.[facility] accepted patient, sister in agreement.    As per Runell Gess, the facility did not accept patient, they registered this on the portal in error.  The facility cannot accept the patient.              Expected Discharge Plan and Services                                                 Social Determinants of Health (SDOH) Interventions    Readmission Risk Interventions Readmission Risk Prevention Plan 06/06/2021  Post Dischage Appt Complete  Medication Screening Complete  Transportation Screening Complete  Some recent data might be hidden

## 2021-06-20 NOTE — Progress Notes (Signed)
[facility] PROGRESS NOTE    Sabrina Johnston  SAY:[id number] DOB: 05/27/1956 DOA: 06/01/2021 PCP: Lauro Regulus, MD      Brief Narrative:  Sabrina Johnston is a 65 y.o. F with IDD, seizures and recent E coli bacteremia who presented with falls and agitation.    Presented with weakness and malodorous urine, found to have E. coli bacteremia/sepsis due to UTI.  She was admitted for ~ 6 days, during that time, was mostly not cooperative with RN, techs, rehab and providers.  Intermittently she would cooperate, but mostly was resistant or self-injurious.  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  There, her behaviors were too difficult to manage, and so facility returned her to the hospital for placement.  Here, she has been stable since returning.  TOC are unable to find appropriate placement.        Assessment & Plan:  Resolving delirium superimposed on advanced cognitive impairment at baseline Intellectual or developmental delay At baseline, patient has limited cognitive and neurobehavioral function.  Per report from legal gaurdian, she can ambulate "some", is non-verbal, dependent but cooperative for all cares.    Since her admission for E coli sepsis, she has been resistant to cares often in the hospital, seems unpredictable.    Workup for medical causes of this perceived change in behavior have been extensive.  Procal low, CT imaging unremarkable. Repeated infectious work up here remains normal and she remains afebrile.  No focal neurological deficits.  Neurology consulted, suspect this is slowly resolving delirium from her prior illness or a new worsened cognitive baseline.  In point of fact, it is unclear if the patient is different from  her baseline or not, as certain behaviors described by her group home are not the norm in the acute hospital setting (e.g. it is reported that she sometimes rolls around on the ground at her facility, which isn't tolerated in the hospital).  Here, her oral intake has been good.  Even completed a calorie count, and she self fed impressively well.  She sleeps and lays in bed most of the day, which is consistent with how her behaviors were described at her facility.    Most rehab notes seem to describe that she does not understand to comply with rehab directions, and does not carry over instruction from session to session, and so they signed off, however, in the only notes I can see where activity was attempted, the patient required max cuing but minimal physical assistance ("ambulates 20' in room with min HHA" and "...she moves fairly well. Min assist to exit L side of bed. She stood one x EOB however quickly attempted to sit on floor ...")  Stable.  No change.   Neurology and I believe her current behaviors are reflective of cognitive impairment, exacerbated by the severely restrictive and unfamiliar hospital environment.  I do not believe medical therapy will ameliorate this.  - Limit Beers criteria meds - Avoid anticholinergics - Continue Geodon - TOC consult for safe placement       Epilepsy No seizures since admission - Continue Depakote     AKI Cr up to 1.5 on 8/31 due to poor oral intake. Resolved with IV fliuds.      Anemia, macrocytic Unclear cause. B12 normal.  TSH slightly high but T3, free T4 normal. -  Outpatient CBC in 1 month  Rhabdo Transaminitis from muscle This was due to "thrashing in bed" resolved   Chronic constipation - Continue home senna  Finger burn or abrasion From the time of last discharge to the time of readmission, the patient seems to have developed a new lesion on her right index finger.  This appears burn-like or contact dermatitis like.  It is  resolving by itself, no cellulitis.          Disposition: Status is: Inpatient  Remains inpatient appropriate because:Unsafe d/c plan  Dispo: The patient is from: Home              Anticipated d/c is to: SNF              Patient currently is medically stable to d/c.   Difficult to place patient No   evel of care: Med-Surg  The patient was admitted Sepsis last month, treated and discharged.  Her facility sent her back to the hospital for perceived inappropriate level of care (they perceived that she required rehab care).    Here, an extensive work up has shown no neurological nor metabolic cause of her change in behavior, and careful observation actually fails to prove that there IS a change in her behavior.  We suspect that the delirium during her recent bacteremia is caused worsening of her already extremely limited cognitive function, and behaviors visible now or simply result of her developmental delay, worsening cognitive function, and that she now needs skilled nursing level of care possibly permanently.  Placement pending.          MDM: The below labs and imaging reports were reviewed and summarized above.  Medication management as above.    DVT prophylaxis: enoxaparin (LOVENOX) injection 40 mg Start: 06/07/21 2200 Place and maintain sequential compression device Start: 06/07/21 1434  Code Status: FULL Family Communication:  Sister and POA by phone           Subjective: No change in behavior.  Oral intake is good.  No fever, respiratory symptoms, seizures, vomiting.  She is nonverbal.        Objective: Vitals:   06/20/21 0433 06/20/21 0838 06/20/21 1231 06/20/21 1521  BP:  (!) 122/104 (!) 110/93 99/69  Pulse: 86 85 97 89  Resp: 18 17 17    Temp: 97.6 F (36.4 C) 97.7 F (36.5 C) 97.7 F (36.5 C) 98.1 F (36.7 C)  TempSrc: Axillary     SpO2: 100%  90% 100%  Weight:      Height:        Intake/Output Summary (Last 24 hours) at 06/20/2021  1612 Last data filed at 06/20/2021 0950 Gross per 24 hour  Intake 20 ml  Output --  Net 20 ml   Filed Weights   06/01/21 1856 06/16/21 1507  Weight: 61.2 kg 63.6 kg    Examination: General appearance: Adult female, congenital developmental delay, lying in bed, does not respond to verbal stimuli HEENT:    Skin: Finger doing well. Cardiac: RRR, no murmurs, no lower extremity edema Respiratory: Normal respiratory rate and rhythm, lungs clear without rales or wheezes Abdomen:   MSK:  Neuro: Awake, increased tone in all 4 extremities, mostly just resisting movement, no spontaneous verbalization Psych: Nonverbal     Data Reviewed: I have personally reviewed following labs and imaging studies:  CBC: Recent Labs  Lab 06/14/21 0713 06/15/21 0329 06/20/21 0555  WBC 11.4* 13.1* 9.4  HGB 10.7* 9.7* 11.9*  HCT 33.1* 29.2*  34.5*  MCV 114.5* 112.3* 113.5*  PLT 360 330 366   Basic Metabolic Panel: Recent Labs  Lab 06/15/21 0329 06/16/21 0803 06/17/21 0612 06/18/21 0608  NA 144 143 146* 142  K 4.0 3.9 4.1 3.4*  CL 111 112* 115* 110  CO2 26 24 24 28   GLUCOSE 93 88 88 91  BUN 19 19 14 12   CREATININE 1.08* 0.95 0.80 0.76  CALCIUM 7.7* 7.8* 8.3* 7.7*   GFR: Estimated Creatinine Clearance: 59.9 mL/min (by C-G formula based on SCr of 0.76 mg/dL). Liver Function Tests: Recent Labs  Lab 06/15/21 0329  AST 88*  ALT 49*  ALKPHOS 75  BILITOT 0.9  PROT 5.2*  ALBUMIN 2.1*   No results for input(s): LIPASE, AMYLASE in the last 168 hours. No results for input(s): AMMONIA in the last 168 hours. Coagulation Profile: No results for input(s): INR, PROTIME in the last 168 hours. Cardiac Enzymes: Recent Labs  Lab 06/14/21 0713 06/15/21 0329 06/16/21 0803 06/18/21 0608  CKTOTAL 2,696* 1,472* 559* 152   BNP (last 3 results) No results for input(s): PROBNP in the last 8760 hours. HbA1C: No results for input(s): HGBA1C in the last 72 hours. CBG: No results for input(s):  GLUCAP in the last 168 hours. Lipid Profile: No results for input(s): CHOL, HDL, LDLCALC, TRIG, CHOLHDL, LDLDIRECT in the last 72 hours. Thyroid Function Tests: No results for input(s): TSH, T4TOTAL, FREET4, T3FREE, THYROIDAB in the last 72 hours. Anemia Panel: No results for input(s): VITAMINB12, FOLATE, FERRITIN, TIBC, IRON, RETICCTPCT in the last 72 hours. Urine analysis:    Component Value Date/Time   COLORURINE YELLOW (A) 06/02/2021 0018   APPEARANCEUR HAZY (A) 06/02/2021 0018   APPEARANCEUR Cloudy (A) 10/26/2016 1137   LABSPEC 1.006 06/02/2021 0018   PHURINE 7.0 06/02/2021 0018   GLUCOSEU NEGATIVE 06/02/2021 0018   HGBUR NEGATIVE 06/02/2021 0018   BILIRUBINUR NEGATIVE 06/02/2021 0018   BILIRUBINUR 1+ 11/01/2017 1630   BILIRUBINUR Negative 10/26/2016 1137   KETONESUR 5 (A) 06/02/2021 0018   PROTEINUR NEGATIVE 06/02/2021 0018   UROBILINOGEN 0.2 11/01/2017 1630   NITRITE NEGATIVE 06/02/2021 0018   LEUKOCYTESUR NEGATIVE 06/02/2021 0018   Sepsis Labs: @LABRCNTIP (procalcitonin:4,lacticacidven:4)  ) Recent Results (from the past 240 hour(s))  CULTURE, BLOOD (ROUTINE X 2) w Reflex to ID Panel     Status: None   Collection Time: 06/14/21  8:46 AM   Specimen: BLOOD  Result Value Ref Range Status   Specimen Description BLOOD BLOOD LEFT HAND  Final   Special Requests   Final    BOTTLES DRAWN AEROBIC AND ANAEROBIC Blood Culture results may not be optimal due to an excessive volume of blood received in culture bottles   Culture   Final    NO GROWTH 5 DAYS Performed at [facility], [address] [place], [place] [address]    Report Status 06/19/2021 FINAL  Final  CULTURE, BLOOD (ROUTINE X 2) w Reflex to ID Panel     Status: None   Collection Time: 06/14/21  8:46 AM   Specimen: BLOOD  Result Value Ref Range Status   Specimen Description BLOOD BLOOD RIGHT HAND  Final   Special Requests   Final    BOTTLES DRAWN AEROBIC AND ANAEROBIC Blood Culture adequate volume    Culture   Final    NO GROWTH 5 DAYS Performed at [facility], [address] [place], [place] [address]    Report Status 06/19/2021 FINAL  Final  Urine Culture  Status: Abnormal   Collection Time: 06/14/21  6:19 PM   Specimen: Urine, Catheterized  Result Value Ref Range Status   Specimen Description   Final    URINE, CATHETERIZED Performed at [facility], [address], [place], [place] [address]    Special Requests   Final    NONE Performed at [facility], [address] [place], [place] [address]    Culture (A)  Final    <10,000 COLONIES/mL INSIGNIFICANT GROWTH Performed at [facility], [address], [place], [place] [address]    Report Status 06/17/2021 FINAL  Final         Radiology Studies: No results found.      Scheduled Meds:  divalproex  500 mg Oral BID   enoxaparin (LOVENOX) injection  40 mg Subcutaneous Q24H   feeding supplement  237 mL Oral BID BM   senna-docusate  2 tablet Oral BID   ziprasidone  20 mg Oral BID WC   Continuous Infusions:      LOS: 17 days    Time spent: 15 minutes    Alberteen Sam, MD [facility] 06/20/2021, 4:12 PM     Please page though AMION or Epic secure chat:  For [employer], Higher education careers adviser

## 2021-06-21 NOTE — Progress Notes (Signed)
PROGRESS NOTE    Sabrina Johnston  SNK:[id number] DOB: 10/04/1956 DOA: 06/01/2021 PCP: Lauro Regulus, MD   Chief Complain: Fall, agitation  Brief Narrative:  Patient is a 65 year old female with intellectual delay/disability, seizures, recent E. coli bacteremia who presented from group home with falls, agitation.  She was recently admitted here and was found to have E. coli UTI/bacteremia, treated with IV antibiotics and was discharged to group home.  Group home returned the patient here because she was too difficult to manage.  Since then she is waiting for placement.  TOC following.  Long length of stay.  Assessment & Plan:   Active Problems:   Generalized epilepsy (HCC)   Severe intellectual disability   Bacteremia due to Gram-negative bacteria   Abnormal TSH   Pressure ulcer with abrasion, blister, partial thickness skin loss involving epidermis and/or dermis (HCC)   Advanced cognitive impairment at baseline/intellectual/developmental delay: At baseline, she has limited cognitive and neurobehavioral function.  As per her caretaker, she was able to ambulate, dependent but cooperative for cares.  She is nonverbal at baseline.  After she was discharged from here after being treated for E. coli bacteremia, she has been resistant to cares, agitated, noncompliant.  Extensive work-up was done given her change in the behavior.  Neurology was also consulted.  Patient has declined cognitively from her baseline.  She could also have suffered from hospital-acquired delirium.  no further work-up planned.  She is on Geodon. Continue delirium precautions. TOC is following for placement. If her behavioral status continues to decline or if she continues to be severely agitated, we will consider consulting psychiatry.  History of epilepsy: No seizures since admission.  On Depakote  AKI: Resolved with IV fluids  Macrocytic anemia: Vitamin B12 normal.  TSH slightly high but T3 and T4 normal.   Continue to monitor.  Hemoglobin stable  Rhabdomyolysis: Resolved  Constipation: Continue bowel regimen  Right index finger skin lesion: Improving .  No finding of cellulitis.  No indication for antibiotic therapy    Nutrition Problem: Inadequate oral intake Etiology:  (altered mental status)      DVT prophylaxis:Lovenox Code Status: Full Family Communication: None at the bedside Status is: Inpatient  Remains inpatient appropriate because:Unsafe d/c plan  Dispo: The patient is from: Group home              Anticipated d/c is to: Group home              Patient currently is medically stable to d/c.   Difficult to place patient Yes      Consultants: Neurology  Procedures:None  Antimicrobials:  Anti-infectives (From admission, onward)    Start     Dose/Rate Route Frequency Ordered Stop   06/04/21 1000  cephALEXin (KEFLEX) capsule 500 mg        500 mg Oral 4 times daily 06/04/21 0811 06/08/21 2329   06/02/21 1000  cefTRIAXone (ROCEPHIN) 2 g in sodium chloride 0.9 % 100 mL IVPB  Status:  Discontinued        2 g 200 mL/hr over 30 Minutes Intravenous Every 24 hours 06/02/21 0950 06/04/21 0810   06/02/21 0345  cefTRIAXone (ROCEPHIN) 1 g in sodium chloride 0.9 % 100 mL IVPB        1 g 200 mL/hr over 30 Minutes Intravenous  Once 06/02/21 0330 06/02/21 0452   06/01/21 2200  cefTRIAXone (ROCEPHIN) 1 g in sodium chloride 0.9 % 100 mL IVPB  1 g 200 mL/hr over 30 Minutes Intravenous  Once 06/01/21 2150 06/02/21 0039       Subjective:  Patient seen and examined at the bedside this morning.  She was tossing and turning on the bed.  Nonverbal, does not obey command.  Sitter at the bedside.  Not in distress   Objective: Vitals:   06/20/21 2336 06/21/21 0000 06/21/21 0500 06/21/21 1025  BP:  118/88 125/64 (!) 125/107  Pulse: 87  (!) 101 (!) 102  Resp: 18  20 20   Temp: 97.9 F (36.6 C)  97.9 F (36.6 C) (!) 97.4 F (36.3 C)  TempSrc: Axillary  Axillary Axillary   SpO2: 96%  99% 99%  Weight:      Height:       No intake or output data in the 24 hours ending 06/21/21 1302 Filed Weights   06/01/21 1856 06/16/21 1507  Weight: 61.2 kg 63.6 kg    Examination:  General exam: Overall comfortable, mildly agitated, nonverbal, does not obey command HEENT: PERRL Respiratory system:  no wheezes or crackles  Cardiovascular system: S1 & S2 heard, RRR.  Gastrointestinal system: Abdomen is nondistended, soft and nontender. Central nervous system: Awake but not oriented Extremities: No edema, no clubbing ,no cyanosis Skin: No rashes, no ulcers,no icterus       Data Reviewed: I have personally reviewed following labs and imaging studies  CBC: Recent Labs  Lab 06/15/21 0329 06/20/21 0555  WBC 13.1* 9.4  HGB 9.7* 11.9*  HCT 29.2* 34.5*  MCV 112.3* 113.5*  PLT 330 366   Basic Metabolic Panel: Recent Labs  Lab 06/15/21 0329 06/16/21 0803 06/17/21 0612 06/18/21 0608  NA 144 143 146* 142  K 4.0 3.9 4.1 3.4*  CL 111 112* 115* 110  CO2 26 24 24 28   GLUCOSE 93 88 88 91  BUN 19 19 14 12   CREATININE 1.08* 0.95 0.80 0.76  CALCIUM 7.7* 7.8* 8.3* 7.7*   GFR: Estimated Creatinine Clearance: 59.9 mL/min (by C-G formula based on SCr of 0.76 mg/dL). Liver Function Tests: Recent Labs  Lab 06/15/21 0329  AST 88*  ALT 49*  ALKPHOS 75  BILITOT 0.9  PROT 5.2*  ALBUMIN 2.1*   No results for input(s): LIPASE, AMYLASE in the last 168 hours. No results for input(s): AMMONIA in the last 168 hours. Coagulation Profile: No results for input(s): INR, PROTIME in the last 168 hours. Cardiac Enzymes: Recent Labs  Lab 06/15/21 0329 06/16/21 0803 06/18/21 0608  CKTOTAL 1,472* 559* 152   BNP (last 3 results) No results for input(s): PROBNP in the last 8760 hours. HbA1C: No results for input(s): HGBA1C in the last 72 hours. CBG: No results for input(s): GLUCAP in the last 168 hours. Lipid Profile: No results for input(s): CHOL, HDL, LDLCALC,  TRIG, CHOLHDL, LDLDIRECT in the last 72 hours. Thyroid Function Tests: No results for input(s): TSH, T4TOTAL, FREET4, T3FREE, THYROIDAB in the last 72 hours. Anemia Panel: No results for input(s): VITAMINB12, FOLATE, FERRITIN, TIBC, IRON, RETICCTPCT in the last 72 hours. Sepsis Labs: Recent Labs  Lab 06/15/21 0329 06/16/21 0803  PROCALCITON 0.67 0.26    Recent Results (from the past 240 hour(s))  CULTURE, BLOOD (ROUTINE X 2) w Reflex to ID Panel     Status: None   Collection Time: 06/14/21  8:46 AM   Specimen: BLOOD  Result Value Ref Range Status   Specimen Description BLOOD BLOOD LEFT HAND  Final   Special Requests   Final    BOTTLES  DRAWN AEROBIC AND ANAEROBIC Blood Culture results may not be optimal due to an excessive volume of blood received in culture bottles   Culture   Final    NO GROWTH 5 DAYS Performed at [facility], [address] [place], [place] [address]    Report Status 06/19/2021 FINAL  Final  CULTURE, BLOOD (ROUTINE X 2) w Reflex to ID Panel     Status: None   Collection Time: 06/14/21  8:46 AM   Specimen: BLOOD  Result Value Ref Range Status   Specimen Description BLOOD BLOOD RIGHT HAND  Final   Special Requests   Final    BOTTLES DRAWN AEROBIC AND ANAEROBIC Blood Culture adequate volume   Culture   Final    NO GROWTH 5 DAYS Performed at [facility], [address] [place], [place] [address]    Report Status 06/19/2021 FINAL  Final  Urine Culture     Status: Abnormal   Collection Time: 06/14/21  6:19 PM   Specimen: Urine, Catheterized  Result Value Ref Range Status   Specimen Description   Final    URINE, CATHETERIZED Performed at [facility], [address], [place], [place] [address]    Special Requests   Final    NONE Performed at [facility], [address], [place], [place] [address]    Culture (A)  Final    <10,000 COLONIES/mL INSIGNIFICANT GROWTH Performed at [facility], [address], [place], [place] [address]    Report Status 06/17/2021 FINAL  Final         Radiology Studies: No results found.      Scheduled Meds:  divalproex  500 mg Oral BID   enoxaparin (LOVENOX) injection  40 mg Subcutaneous Q24H   feeding supplement  237 mL Oral BID BM   senna-docusate  2 tablet Oral BID   ziprasidone  20 mg Oral BID WC   Continuous Infusions:   LOS: 18 days    Time spent: 25 mins.More than 50% of that time was spent in counseling and/or coordination of care.      Burnadette Pop, MD [facility] P9/04/2021, 1:02 PM

## 2021-06-22 NOTE — Progress Notes (Signed)
Nutrition Follow-up  DOCUMENTATION CODES:  Not applicable  INTERVENTION:  Continue current diet as ordered, nursing staff to assist with meals Ensure Enlive po BID, each supplement provides 350 kcal and 20 grams of protein  NUTRITION DIAGNOSIS:  Inadequate oral intake related to  (altered mental status) as evidenced by  (routinely eating <75% of meals).  GOAL:  Patient will meet greater than or equal to 90% of their needs  MONITOR:  PO intake, Supplement acceptance  REASON FOR ASSESSMENT:  Consult Calorie Count  ASSESSMENT:  65 y.o. female presented to the ED with AMS and falls after being discharged back to her group home 4 hours prior to return. PMH of mental retardation, epilepsy, and chronic constipation. Pt nonverbal at baseline. Previous admission for sepsis due to UTI.  Pt resting in bed at the time of visit, non interactive and nonverbal. Sitter at bedside states pt ate well at breakfast, lunch has not been delivered yet. Endorses that pt does like the ensure and has been drinking them from her sippy cup. Will continue current interventions.  Discussed in rounds, case management searching for discharge options.   Average Meal Intake: 8/18-9/2: 54% intake x 18 recorded meals (0-100%) 9/3-9/8: 46% intake x 10 recorded meals (0-100%)  Nutritionally Relevant Medications: Scheduled Meds:  feeding supplement  237 mL Oral BID BM   senna-docusate  2 tablet Oral BID   PRN Meds: ondansetron  Labs Reviewed  NUTRITION - FOCUSED PHYSICAL EXAM: Flowsheet Row Most Recent Value  Orbital Region No depletion  Upper Arm Region No depletion  Thoracic and Lumbar Region No depletion  Buccal Region No depletion  Temple Region No depletion  Clavicle Bone Region No depletion  Clavicle and Acromion Bone Region No depletion  Scapular Bone Region No depletion  Dorsal Hand No depletion  Patellar Region No depletion  Anterior Thigh Region No depletion  Posterior Calf Region No  depletion  Edema (RD Assessment) None  Hair Reviewed  Eyes Unable to assess  Mouth Unable to assess  Skin Reviewed  Nails Reviewed   Diet Order:   Diet Order             DIET DYS 3 Room service appropriate? Yes; Fluid consistency: Thin  Diet effective now                   EDUCATION NEEDS:  No education needs have been identified at this time  Skin:  Skin Assessment: Reviewed RN Assessment  Last BM:  9/5 - type 2  Height:  Ht Readings from Last 1 Encounters:  06/01/21 5' (1.524 m)   Weight:  Wt Readings from Last 1 Encounters:  06/16/21 63.6 kg   Ideal Body Weight:  45.5 kg  BMI:  Body mass index is 27.38 kg/m.  Estimated Nutritional Needs:  Kcal:  1400-1600 kcal/d Protein:  70-80 g/d Fluid:  1.5-1.8 L/d   Greig Castilla, RD, LDN Clinical Dietitian Pager on Amion

## 2021-06-22 NOTE — Progress Notes (Signed)
PROGRESS NOTE    Sabrina Johnston  SNK:[id number] DOB: 10/04/1956 DOA: 06/01/2021 PCP: Lauro Regulus, MD   Chief Complain: Fall, agitation  Brief Narrative:  Patient is a 65 year old female with intellectual delay/disability, seizures, recent E. coli bacteremia who presented from group home with falls, agitation.  She was recently admitted here and was found to have E. coli UTI/bacteremia, treated with IV antibiotics and was discharged to group home.  Group home returned the patient here because she was too difficult to manage.  Since then she is waiting for placement.  TOC following.  Long length of stay.  Assessment & Plan:   Active Problems:   Generalized epilepsy (HCC)   Severe intellectual disability   Bacteremia due to Gram-negative bacteria   Abnormal TSH   Pressure ulcer with abrasion, blister, partial thickness skin loss involving epidermis and/or dermis (HCC)   Advanced cognitive impairment at baseline/intellectual/developmental delay: At baseline, she has limited cognitive and neurobehavioral function.  As per her caretaker, she was able to ambulate, dependent but cooperative for cares.  She is nonverbal at baseline.  After she was discharged from here after being treated for E. coli bacteremia, she has been resistant to cares, agitated, noncompliant.  Extensive work-up was done given her change in the behavior.  Neurology was also consulted.  Patient has declined cognitively from her baseline.  She could also have suffered from hospital-acquired delirium.  no further work-up planned.  She is on Geodon. Continue delirium precautions. TOC is following for placement. If her behavioral status continues to decline or if she continues to be severely agitated, we will consider consulting psychiatry.  History of epilepsy: No seizures since admission.  On Depakote  AKI: Resolved with IV fluids  Macrocytic anemia: Vitamin B12 normal.  TSH slightly high but T3 and T4 normal.   Continue to monitor.  Hemoglobin stable  Rhabdomyolysis: Resolved  Constipation: Continue bowel regimen  Right index finger skin lesion: Improving .  No finding of cellulitis.  No indication for antibiotic therapy    Nutrition Problem: Inadequate oral intake Etiology:  (altered mental status)      DVT prophylaxis:Lovenox Code Status: Full Family Communication: None at the bedside Status is: Inpatient  Remains inpatient appropriate because:Unsafe d/c plan  Dispo: The patient is from: Group home              Anticipated d/c is to: Group home              Patient currently is medically stable to d/c.   Difficult to place patient Yes      Consultants: Neurology  Procedures:None  Antimicrobials:  Anti-infectives (From admission, onward)    Start     Dose/Rate Route Frequency Ordered Stop   06/04/21 1000  cephALEXin (KEFLEX) capsule 500 mg        500 mg Oral 4 times daily 06/04/21 0811 06/08/21 2329   06/02/21 1000  cefTRIAXone (ROCEPHIN) 2 g in sodium chloride 0.9 % 100 mL IVPB  Status:  Discontinued        2 g 200 mL/hr over 30 Minutes Intravenous Every 24 hours 06/02/21 0950 06/04/21 0810   06/02/21 0345  cefTRIAXone (ROCEPHIN) 1 g in sodium chloride 0.9 % 100 mL IVPB        1 g 200 mL/hr over 30 Minutes Intravenous  Once 06/02/21 0330 06/02/21 0452   06/01/21 2200  cefTRIAXone (ROCEPHIN) 1 g in sodium chloride 0.9 % 100 mL IVPB  1 g 200 mL/hr over 30 Minutes Intravenous  Once 06/01/21 2150 06/02/21 0039       Subjective:  Patient seen and examined the bedside this morning.  Same like  yesterday.  Tossing and turning on the bed.  Not in distress.  Not agitated.  Sitter at the bedside   Objective: Vitals:   06/22/21 0518 06/22/21 0525 06/22/21 0540 06/22/21 0731  BP:  (!) 192/130 (!) 128/50 (!) 135/92  Pulse: (!) 106 (!) 166 (!) 53 86  Resp: 18   17  Temp: 98.4 F (36.9 C) 98.4 F (36.9 C)  97.7 F (36.5 C)  TempSrc: Axillary   Axillary  SpO2:  100% 97%    Weight:      Height:        Intake/Output Summary (Last 24 hours) at 06/22/2021 0807 Last data filed at 06/22/2021 7353 Gross per 24 hour  Intake 0 ml  Output 1 ml  Net -1 ml   Filed Weights   06/01/21 1856 06/16/21 1507  Weight: 61.2 kg 63.6 kg    Examination:  General exam: Overall comfortable, mildly agitated, nonverbal, does not obey command Central nervous system: Awake but not oriented Extremities: No edema, no clubbing ,no cyanosis Skin: No rashes, no ulcers,no icterus       Data Reviewed: I have personally reviewed following labs and imaging studies  CBC: Recent Labs  Lab 06/20/21 0555  WBC 9.4  HGB 11.9*  HCT 34.5*  MCV 113.5*  PLT 366   Basic Metabolic Panel: Recent Labs  Lab 06/16/21 0803 06/17/21 0612 06/18/21 0608  NA 143 146* 142  K 3.9 4.1 3.4*  CL 112* 115* 110  CO2 24 24 28   GLUCOSE 88 88 91  BUN 19 14 12   CREATININE 0.95 0.80 0.76  CALCIUM 7.8* 8.3* 7.7*   GFR: Estimated Creatinine Clearance: 59.9 mL/min (by C-G formula based on SCr of 0.76 mg/dL). Liver Function Tests: No results for input(s): AST, ALT, ALKPHOS, BILITOT, PROT, ALBUMIN in the last 168 hours.  No results for input(s): LIPASE, AMYLASE in the last 168 hours. No results for input(s): AMMONIA in the last 168 hours. Coagulation Profile: No results for input(s): INR, PROTIME in the last 168 hours. Cardiac Enzymes: Recent Labs  Lab 06/16/21 0803 06/18/21 0608  CKTOTAL 559* 152   BNP (last 3 results) No results for input(s): PROBNP in the last 8760 hours. HbA1C: No results for input(s): HGBA1C in the last 72 hours. CBG: No results for input(s): GLUCAP in the last 168 hours. Lipid Profile: No results for input(s): CHOL, HDL, LDLCALC, TRIG, CHOLHDL, LDLDIRECT in the last 72 hours. Thyroid Function Tests: No results for input(s): TSH, T4TOTAL, FREET4, T3FREE, THYROIDAB in the last 72 hours. Anemia Panel: No results for input(s): VITAMINB12, FOLATE,  FERRITIN, TIBC, IRON, RETICCTPCT in the last 72 hours. Sepsis Labs: Recent Labs  Lab 06/16/21 0803  PROCALCITON 0.26    Recent Results (from the past 240 hour(s))  CULTURE, BLOOD (ROUTINE X 2) w Reflex to ID Panel     Status: None   Collection Time: 06/14/21  8:46 AM   Specimen: BLOOD  Result Value Ref Range Status   Specimen Description BLOOD BLOOD LEFT HAND  Final   Special Requests   Final    BOTTLES DRAWN AEROBIC AND ANAEROBIC Blood Culture results may not be optimal due to an excessive volume of blood received in culture bottles   Culture   Final    NO GROWTH 5 DAYS Performed at  [facility], [address] [place], [place] [address]    Report Status 06/19/2021 FINAL  Final  CULTURE, BLOOD (ROUTINE X 2) w Reflex to ID Panel     Status: None   Collection Time: 06/14/21  8:46 AM   Specimen: BLOOD  Result Value Ref Range Status   Specimen Description BLOOD BLOOD RIGHT HAND  Final   Special Requests   Final    BOTTLES DRAWN AEROBIC AND ANAEROBIC Blood Culture adequate volume   Culture   Final    NO GROWTH 5 DAYS Performed at [facility], [address] [place], [place] [address]    Report Status 06/19/2021 FINAL  Final  Urine Culture     Status: Abnormal   Collection Time: 06/14/21  6:19 PM   Specimen: Urine, Catheterized  Result Value Ref Range Status   Specimen Description   Final    URINE, CATHETERIZED Performed at [facility], [address] [place], [place] [address]    Special Requests   Final    NONE Performed at [facility], [address] [place], [place] [address]    Culture (A)  Final    <10,000 COLONIES/mL INSIGNIFICANT GROWTH Performed at [facility], [address] [place], [place] [address]    Report Status 06/17/2021 FINAL  Final         Radiology Studies: No results found.      Scheduled Meds:  divalproex  500 mg Oral BID   enoxaparin (LOVENOX) injection  40 mg Subcutaneous Q24H    feeding supplement  237 mL Oral BID BM   senna-docusate  2 tablet Oral BID   ziprasidone  20 mg Oral BID WC   Continuous Infusions:   LOS: 19 days    Time spent: 15 mins.More than 50% of that time was spent in counseling and/or coordination of care.      Burnadette Pop, MD [facility] P9/05/2021, 8:07 AM

## 2021-06-23 MED ORDER — HYDROCORTISONE 1 % EX CREA
1.0000 "application " | TOPICAL_CREAM | Freq: Three times a day (TID) | CUTANEOUS | Status: DC
  Administered 2021-06-23 – 2021-07-06 (×38): 1 via TOPICAL
  Filled 2021-06-23: qty 28

## 2021-06-23 NOTE — Progress Notes (Signed)
PROGRESS NOTE    Sabrina Johnston  TKW:[id number] DOB: 05-Jul-1956 DOA: 06/01/2021 PCP: Lauro Regulus, MD   Chief Complain: Fall, agitation  Brief Narrative:  Patient is a 65 year old female with intellectual delay/disability, seizures, recent E. coli bacteremia who presented from group home with falls, agitation.  She was recently admitted here and was found to have E. coli UTI/bacteremia, treated with IV antibiotics and was discharged to group home.  Group home returned the patient here because she was too difficult to manage.  Since then she is waiting for placement.  TOC following.  Long length of stay.  Assessment & Plan:   Active Problems:   Generalized epilepsy (HCC)   Severe intellectual disability   Bacteremia due to Gram-negative bacteria   Abnormal TSH   Pressure ulcer with abrasion, blister, partial thickness skin loss involving epidermis and/or dermis (HCC)   Advanced cognitive impairment at baseline/intellectual/developmental delay: At baseline, she has limited cognitive and neurobehavioral function.  As per her caretaker, she was able to ambulate, dependent but cooperative for cares.  She is nonverbal at baseline.  After she was discharged from here after being treated for E. coli bacteremia, she has been resistant to cares, agitated, noncompliant.  Extensive work-up was done given her change in the behavior.  Neurology was also consulted.  Patient has declined cognitively from her baseline.  She could also have suffered from hospital-acquired delirium.  no further work-up planned.  She is on Geodon. Continue delirium precautions. TOC is following for placement. If her behavioral status continues to decline or if she continues to be severely agitated, we will consider consulting psychiatry.  History of epilepsy: No seizures since admission.  On Depakote  AKI: Resolved with IV fluids  Macrocytic anemia: Vitamin B12 normal.  TSH slightly high but T3 and T4 normal.   Continue to monitor.  Hemoglobin stable  Rhabdomyolysis: Resolved  Constipation: Continue bowel regimen  Right index finger skin lesion: Improving .  No finding of cellulitis.  No indication for antibiotic therapy  Generalized macular rash: Unclear etiology.  She was seen scratching his skin.  Continue hydrocortisone as needed    Nutrition Problem: Inadequate oral intake Etiology:  (altered mental status)      DVT prophylaxis:Lovenox Code Status: Full Family Communication: None at the bedside Status is: Inpatient  Remains inpatient appropriate because:Unsafe d/c plan  Dispo: The patient is from: Group home              Anticipated d/c is to: Group home              Patient currently is medically stable to d/c.   Difficult to place patient Yes      Consultants: Neurology  Procedures:None  Antimicrobials:  Anti-infectives (From admission, onward)    Start     Dose/Rate Route Frequency Ordered Stop   06/04/21 1000  cephALEXin (KEFLEX) capsule 500 mg        500 mg Oral 4 times daily 06/04/21 0811 06/08/21 2329   06/02/21 1000  cefTRIAXone (ROCEPHIN) 2 g in sodium chloride 0.9 % 100 mL IVPB  Status:  Discontinued        2 g 200 mL/hr over 30 Minutes Intravenous Every 24 hours 06/02/21 0950 06/04/21 0810   06/02/21 0345  cefTRIAXone (ROCEPHIN) 1 g in sodium chloride 0.9 % 100 mL IVPB        1 g 200 mL/hr over 30 Minutes Intravenous  Once 06/02/21 0330 06/02/21 0452   06/01/21 2200  cefTRIAXone (ROCEPHIN) 1 g in sodium chloride 0.9 % 100 mL IVPB        1 g 200 mL/hr over 30 Minutes Intravenous  Once 06/01/21 2150 06/02/21 0039       Subjective:  Patient seen and examined at the bedside this morning.  Hemodynamically stable.  She was lying on the bed, sleeping.  Sitter at the bedside.   Objective: Vitals:   06/22/21 1108 06/22/21 1558 06/22/21 1921 06/23/21 0600  BP: (!) 128/22 122/77 105/65 (!) 110/56  Pulse: 95 100 95 68  Resp: 17 17 18 18   Temp: 97.7 F  (36.5 C) 97.7 F (36.5 C) 97.7 F (36.5 C) (!) 97.5 F (36.4 C)  TempSrc:    Axillary  SpO2: (!) 80% 100% 99% 98%  Weight:      Height:        Intake/Output Summary (Last 24 hours) at 06/23/2021 0752 Last data filed at 06/22/2021 2000 Gross per 24 hour  Intake 240 ml  Output 1 ml  Net 239 ml   Filed Weights   06/01/21 1856 06/16/21 1507  Weight: 61.2 kg 63.6 kg    Examination:  General exam: Comfortable, sleeping on the bed Extremities: No edema, no clubbing ,no cyanosis Skin: Generalized macular rash    Data Reviewed: I have personally reviewed following labs and imaging studies  CBC: Recent Labs  Lab 06/20/21 0555  WBC 9.4  HGB 11.9*  HCT 34.5*  MCV 113.5*  PLT 366   Basic Metabolic Panel: Recent Labs  Lab 06/16/21 0803 06/17/21 0612 06/18/21 0608  NA 143 146* 142  K 3.9 4.1 3.4*  CL 112* 115* 110  CO2 24 24 28   GLUCOSE 88 88 91  BUN 19 14 12   CREATININE 0.95 0.80 0.76  CALCIUM 7.8* 8.3* 7.7*   GFR: Estimated Creatinine Clearance: 59.9 mL/min (by C-G formula based on SCr of 0.76 mg/dL). Liver Function Tests: No results for input(s): AST, ALT, ALKPHOS, BILITOT, PROT, ALBUMIN in the last 168 hours.  No results for input(s): LIPASE, AMYLASE in the last 168 hours. No results for input(s): AMMONIA in the last 168 hours. Coagulation Profile: No results for input(s): INR, PROTIME in the last 168 hours. Cardiac Enzymes: Recent Labs  Lab 06/16/21 0803 06/18/21 0608  CKTOTAL 559* 152   BNP (last 3 results) No results for input(s): PROBNP in the last 8760 hours. HbA1C: No results for input(s): HGBA1C in the last 72 hours. CBG: No results for input(s): GLUCAP in the last 168 hours. Lipid Profile: No results for input(s): CHOL, HDL, LDLCALC, TRIG, CHOLHDL, LDLDIRECT in the last 72 hours. Thyroid Function Tests: No results for input(s): TSH, T4TOTAL, FREET4, T3FREE, THYROIDAB in the last 72 hours. Anemia Panel: No results for input(s): VITAMINB12,  FOLATE, FERRITIN, TIBC, IRON, RETICCTPCT in the last 72 hours. Sepsis Labs: Recent Labs  Lab 06/16/21 0803  PROCALCITON 0.26    Recent Results (from the past 240 hour(s))  CULTURE, BLOOD (ROUTINE X 2) w Reflex to ID Panel     Status: None   Collection Time: 06/14/21  8:46 AM   Specimen: BLOOD  Result Value Ref Range Status   Specimen Description BLOOD BLOOD LEFT HAND  Final   Special Requests   Final    BOTTLES DRAWN AEROBIC AND ANAEROBIC Blood Culture results may not be optimal due to an excessive volume of blood received in culture bottles   Culture   Final    NO GROWTH 5 DAYS Performed at [facility],  [address], [place], [place] [address]    Report Status 06/19/2021 FINAL  Final  CULTURE, BLOOD (ROUTINE X 2) w Reflex to ID Panel     Status: None   Collection Time: 06/14/21  8:46 AM   Specimen: BLOOD  Result Value Ref Range Status   Specimen Description BLOOD BLOOD RIGHT HAND  Final   Special Requests   Final    BOTTLES DRAWN AEROBIC AND ANAEROBIC Blood Culture adequate volume   Culture   Final    NO GROWTH 5 DAYS Performed at [facility], [address], [place], [place] [address]    Report Status 06/19/2021 FINAL  Final  Urine Culture     Status: Abnormal   Collection Time: 06/14/21  6:19 PM   Specimen: Urine, Catheterized  Result Value Ref Range Status   Specimen Description   Final    URINE, CATHETERIZED Performed at [facility], [address] [place], [place] [address]    Special Requests   Final    NONE Performed at [facility], [address] [place], [place] [address]    Culture (A)  Final    <10,000 COLONIES/mL INSIGNIFICANT GROWTH Performed at [facility], [address] [place], [place] [address]    Report Status 06/17/2021 FINAL  Final         Radiology Studies: No results found.      Scheduled Meds:  divalproex  500 mg Oral BID   enoxaparin (LOVENOX) injection  40 mg  Subcutaneous Q24H   feeding supplement  237 mL Oral BID BM   senna-docusate  2 tablet Oral BID   ziprasidone  20 mg Oral BID WC   Continuous Infusions:   LOS: 20 days    Time spent: 15 mins.More than 50% of that time was spent in counseling and/or coordination of care.      Burnadette Pop, MD [facility] P9/06/2021, 7:52 AM

## 2021-06-23 NOTE — TOC Progression Note (Signed)
Transition of Care [facility]) - Progression Note    Patient Details  Name: [name] MRN: [id number] Date of Birth: 03-Feb-1956  [facility] CM/SW Contact  Allayne Butcher, RN Phone Number: 06/23/2021, 1:56 PM  Clinical Narrative:    RNCM spoke with Efraim Kaufmann the RN at [facility].  Melissa reports that they do not feel that they can provide adequate care for the patient at the facility any longer.  Patient has been declining since the summer and her behavior has become different, they were taking her to her primary care and her neurologist to try and determine a cause for her change. RNCM did explain that patient is not a candidate for rehab due to her inability to work with PT.  [facility] team will look into [facility] in [place].  Melissa believes that the patient came from there when she was placed at [facility].           Expected Discharge Plan and Services                                                 Social Determinants of Health (SDOH) Interventions    Readmission Risk Interventions Readmission Risk Prevention Plan 06/06/2021  Post Dischage Appt Complete  Medication Screening Complete  Transportation Screening Complete  Some recent data might be hidden

## 2021-06-23 NOTE — TOC Progression Note (Signed)
Transition of Care [facility]) - Progression Note    Patient Details  Name: Sabrina Johnston MRN: [id number] Date of Birth: [birth year]  Transition of Care [facility]) CM/SW Contact  Allayne Butcher, RN Phone Number: 06/23/2021, 2:16 PM  Clinical Narrative:    RNCM has reached out to [facility] for assistance with placement and making a referral to [facility].          Expected Discharge Plan and Services                                                 Social Determinants of Health (SDOH) Interventions    Readmission Risk Interventions Readmission Risk Prevention Plan 06/06/2021  Post Dischage Appt Complete  Medication Screening Complete  Transportation Screening Complete  Some recent data might be hidden

## 2021-06-24 MED ORDER — POLYETHYLENE GLYCOL 3350 17 G PO PACK: 17.0000 g | PACK | Freq: Every day | ORAL | Status: DC

## 2021-06-24 NOTE — Plan of Care (Signed)
  Problem: Pain Managment: Goal: General experience of comfort will improve Outcome: Progressing   Problem: Education: Goal: Knowledge of General Education information will improve Description: Including pain rating scale, medication(s)/side effects and non-pharmacologic comfort measures Outcome: Not Progressing   Problem: Coping: Goal: Level of anxiety will decrease Outcome: Not Progressing   Problem: Safety: Goal: Ability to remain free from injury will improve Outcome: Not Progressing

## 2021-06-24 NOTE — Progress Notes (Signed)
PROGRESS NOTE    Sabrina Johnston  EQA:[id number] DOB: December 30, 1955 DOA: 06/01/2021 PCP: Lauro Regulus, MD   Chief Complain: Fall, agitation  Brief Narrative:  Patient is a 65 year old female with intellectual delay/disability, seizures, recent E. coli bacteremia who presented from group home with falls, agitation.  She was recently admitted here and was found to have E. coli UTI/bacteremia, treated with IV antibiotics and was discharged to group home.  Group home returned the patient here because she was too difficult to manage.  Since then she is waiting for placement.  TOC following.  Long length of stay.  Assessment & Plan:   Active Problems:   Generalized epilepsy (HCC)   Severe intellectual disability   Bacteremia due to Gram-negative bacteria   Abnormal TSH   Pressure ulcer with abrasion, blister, partial thickness skin loss involving epidermis and/or dermis (HCC)   Advanced cognitive impairment at baseline/intellectual/developmental delay: At baseline, she has limited cognitive and neurobehavioral function.  As per her caretaker, she was able to ambulate, dependent but cooperative for cares.  She is nonverbal at baseline.  After she was discharged from here after being treated for E. coli bacteremia, she has been resistant to cares, agitated, noncompliant.  Extensive work-up was done given her change in the behavior.  Neurology was also consulted.  Patient has declined cognitively from her baseline.  She could also have suffered from hospital-acquired delirium.  no further work-up planned.  She is on Geodon. Continue delirium precautions. TOC is following for placement. If her behavioral status continues to decline or if she continues to be severely agitated, we will consider consulting psychiatry.  History of epilepsy: No seizures since admission.  On Depakote  AKI: Resolved with IV fluids  Macrocytic anemia: Vitamin B12 normal.  TSH slightly high but T3 and T4 normal.   Continue to monitor.  Hemoglobin stable  Rhabdomyolysis: Resolved  Constipation: Continue bowel regimen  Right index finger skin lesion: Improved .  No finding of cellulitis.  No indication for antibiotic therapy  Generalized macular rash: Unclear etiology.  She was seen scratching his skin.  Continue hydrocortisone as needed.Much improved now    Nutrition Problem: Inadequate oral intake Etiology:  (altered mental status)      DVT prophylaxis:Lovenox Code Status: Full Family Communication: None at the bedside Status is: Inpatient  Remains inpatient appropriate because:Unsafe d/c plan  Dispo: The patient is from: Group home              Anticipated d/c is to: Group home              Patient currently is medically stable to d/c.   Difficult to place patient Yes      Consultants: Neurology  Procedures:None  Antimicrobials:  Anti-infectives (From admission, onward)    Start     Dose/Rate Route Frequency Ordered Stop   06/04/21 1000  cephALEXin (KEFLEX) capsule 500 mg        500 mg Oral 4 times daily 06/04/21 0811 06/08/21 2329   06/02/21 1000  cefTRIAXone (ROCEPHIN) 2 g in sodium chloride 0.9 % 100 mL IVPB  Status:  Discontinued        2 g 200 mL/hr over 30 Minutes Intravenous Every 24 hours 06/02/21 0950 06/04/21 0810   06/02/21 0345  cefTRIAXone (ROCEPHIN) 1 g in sodium chloride 0.9 % 100 mL IVPB        1 g 200 mL/hr over 30 Minutes Intravenous  Once 06/02/21 0330 06/02/21 0452   06/01/21  2200  cefTRIAXone (ROCEPHIN) 1 g in sodium chloride 0.9 % 100 mL IVPB        1 g 200 mL/hr over 30 Minutes Intravenous  Once 06/01/21 2150 06/02/21 0039       Subjective:  Patient seen and examined the bedside this morning.  Hemodynamically stable.  Lying in bed.  She looks more comfortable today.  Not agitated.   Objective: Vitals:   06/23/21 1655 06/23/21 1942 06/24/21 0654 06/24/21 0724  BP: 121/70 (!) 122/51 117/82 (!) 154/90  Pulse: 82 93 74 83  Resp: 16 19 18 16    Temp: 97.8 F (36.6 C) 97.7 F (36.5 C) 98.3 F (36.8 C)   TempSrc:  Axillary    SpO2: 97% 99% 99% 93%  Weight:      Height:        Intake/Output Summary (Last 24 hours) at 06/24/2021 0746 Last data filed at 06/23/2021 2200 Gross per 24 hour  Intake 120 ml  Output --  Net 120 ml   Filed Weights   06/01/21 1856 06/16/21 1507  Weight: 61.2 kg 63.6 kg    Examination:  General exam: Calm, comfortable, lying on bed   Data Reviewed: I have personally reviewed following labs and imaging studies  CBC: Recent Labs  Lab 06/20/21 0555  WBC 9.4  HGB 11.9*  HCT 34.5*  MCV 113.5*  PLT 366   Basic Metabolic Panel: Recent Labs  Lab 06/18/21 0608  NA 142  K 3.4*  CL 110  CO2 28  GLUCOSE 91  BUN 12  CREATININE 0.76  CALCIUM 7.7*   GFR: Estimated Creatinine Clearance: 59.9 mL/min (by C-G formula based on SCr of 0.76 mg/dL). Liver Function Tests: No results for input(s): AST, ALT, ALKPHOS, BILITOT, PROT, ALBUMIN in the last 168 hours.  No results for input(s): LIPASE, AMYLASE in the last 168 hours. No results for input(s): AMMONIA in the last 168 hours. Coagulation Profile: No results for input(s): INR, PROTIME in the last 168 hours. Cardiac Enzymes: Recent Labs  Lab 06/18/21 0608  CKTOTAL 152   BNP (last 3 results) No results for input(s): PROBNP in the last 8760 hours. HbA1C: No results for input(s): HGBA1C in the last 72 hours. CBG: No results for input(s): GLUCAP in the last 168 hours. Lipid Profile: No results for input(s): CHOL, HDL, LDLCALC, TRIG, CHOLHDL, LDLDIRECT in the last 72 hours. Thyroid Function Tests: No results for input(s): TSH, T4TOTAL, FREET4, T3FREE, THYROIDAB in the last 72 hours. Anemia Panel: No results for input(s): VITAMINB12, FOLATE, FERRITIN, TIBC, IRON, RETICCTPCT in the last 72 hours. Sepsis Labs: No results for input(s): PROCALCITON, LATICACIDVEN in the last 168 hours.   Recent Results (from the past 240 hour(s))  CULTURE,  BLOOD (ROUTINE X 2) w Reflex to ID Panel     Status: None   Collection Time: 06/14/21  8:46 AM   Specimen: BLOOD  Result Value Ref Range Status   Specimen Description BLOOD BLOOD LEFT HAND  Final   Special Requests   Final    BOTTLES DRAWN AEROBIC AND ANAEROBIC Blood Culture results may not be optimal due to an excessive volume of blood received in culture bottles   Culture   Final    NO GROWTH 5 DAYS Performed at [facility], [address], [place], [place]    Report Status 06/19/2021 FINAL  Final  CULTURE, BLOOD (ROUTINE X 2) w Reflex to ID Panel     Status: None   Collection Time: 06/14/21  8:46 AM   Specimen: BLOOD  Result Value Ref Range Status   Specimen Description BLOOD BLOOD RIGHT HAND  Final   Special Requests   Final    BOTTLES DRAWN AEROBIC AND ANAEROBIC Blood Culture adequate volume   Culture   Final    NO GROWTH 5 DAYS Performed at [facility], [address] [place], [place] [address]    Report Status 06/19/2021 FINAL  Final  Urine Culture     Status: Abnormal   Collection Time: 06/14/21  6:19 PM   Specimen: Urine, Catheterized  Result Value Ref Range Status   Specimen Description   Final    URINE, CATHETERIZED Performed at [facility] [address], [place], [place] [address]    Special Requests   Final    NONE Performed at [facility], [address], [place], [place] [address]    Culture (A)  Final    <10,000 COLONIES/mL INSIGNIFICANT GROWTH Performed at [facility], [address], [place], [place] [address]    Report Status 06/17/2021 FINAL  Final         Radiology Studies: No results found.      Scheduled Meds:  divalproex  500 mg Oral BID   enoxaparin (LOVENOX) injection  40 mg Subcutaneous Q24H   feeding supplement  237 mL Oral BID BM   hydrocortisone cream  1 application Topical TID   senna-docusate  2 tablet Oral BID   ziprasidone  20 mg Oral BID WC   Continuous  Infusions:   LOS: 21 days    Time spent: 15 mins.More than 50% of that time was spent in counseling and/or coordination of care.      Burnadette Pop, MD [facility] P9/07/2021, 7:46 AM

## 2021-06-24 NOTE — TOC Progression Note (Signed)
Transition of Care [facility]) - Progression Note    Patient Details  Name: Sabrina Johnston MRN: [id number] Date of Birth: 1956-03-05  Transition of [facility]) CM/SW Contact  Ashley Royalty Lutricia Feil, RN Phone Number:[phone] 06/24/2021, 8:41 AM  Clinical Narrative:    [facility] RN attempted outreach call to [facility] [phone] however office closed and unable to leave a message.  TOC will continue to attempts during office hours.        Expected Discharge Plan and Services                                                 Social Determinants of Health (SDOH) Interventions    Readmission Risk Interventions Readmission Risk Prevention Plan 06/06/2021  Post Dischage Appt Complete  Medication Screening Complete  Transportation Screening Complete  Some recent data might be hidden

## 2021-06-25 NOTE — Progress Notes (Signed)
PROGRESS NOTE    Sabrina Johnston  EQA:[id number] DOB: December 30, 1955 DOA: 06/01/2021 PCP: Lauro Regulus, MD   Chief Complain: Fall, agitation  Brief Narrative:  Patient is a 65 year old female with intellectual delay/disability, seizures, recent E. coli bacteremia who presented from group home with falls, agitation.  She was recently admitted here and was found to have E. coli UTI/bacteremia, treated with IV antibiotics and was discharged to group home.  Group home returned the patient here because she was too difficult to manage.  Since then she is waiting for placement.  TOC following.  Long length of stay.  Assessment & Plan:   Active Problems:   Generalized epilepsy (HCC)   Severe intellectual disability   Bacteremia due to Gram-negative bacteria   Abnormal TSH   Pressure ulcer with abrasion, blister, partial thickness skin loss involving epidermis and/or dermis (HCC)   Advanced cognitive impairment at baseline/intellectual/developmental delay: At baseline, she has limited cognitive and neurobehavioral function.  As per her caretaker, she was able to ambulate, dependent but cooperative for cares.  She is nonverbal at baseline.  After she was discharged from here after being treated for E. coli bacteremia, she has been resistant to cares, agitated, noncompliant.  Extensive work-up was done given her change in the behavior.  Neurology was also consulted.  Patient has declined cognitively from her baseline.  She could also have suffered from hospital-acquired delirium.  no further work-up planned.  She is on Geodon. Continue delirium precautions. TOC is following for placement. If her behavioral status continues to decline or if she continues to be severely agitated, we will consider consulting psychiatry.  History of epilepsy: No seizures since admission.  On Depakote  AKI: Resolved with IV fluids  Macrocytic anemia: Vitamin B12 normal.  TSH slightly high but T3 and T4 normal.   Continue to monitor.  Hemoglobin stable  Rhabdomyolysis: Resolved  Constipation: Continue bowel regimen  Right index finger skin lesion: Improved .  No finding of cellulitis.  No indication for antibiotic therapy  Generalized macular rash: Unclear etiology.  She was seen scratching his skin.  Continue hydrocortisone as needed.Much improved now    Nutrition Problem: Inadequate oral intake Etiology:  (altered mental status)      DVT prophylaxis:Lovenox Code Status: Full Family Communication: None at the bedside Status is: Inpatient  Remains inpatient appropriate because:Unsafe d/c plan  Dispo: The patient is from: Group home              Anticipated d/c is to: Group home              Patient currently is medically stable to d/c.   Difficult to place patient Yes      Consultants: Neurology  Procedures:None  Antimicrobials:  Anti-infectives (From admission, onward)    Start     Dose/Rate Route Frequency Ordered Stop   06/04/21 1000  cephALEXin (KEFLEX) capsule 500 mg        500 mg Oral 4 times daily 06/04/21 0811 06/08/21 2329   06/02/21 1000  cefTRIAXone (ROCEPHIN) 2 g in sodium chloride 0.9 % 100 mL IVPB  Status:  Discontinued        2 g 200 mL/hr over 30 Minutes Intravenous Every 24 hours 06/02/21 0950 06/04/21 0810   06/02/21 0345  cefTRIAXone (ROCEPHIN) 1 g in sodium chloride 0.9 % 100 mL IVPB        1 g 200 mL/hr over 30 Minutes Intravenous  Once 06/02/21 0330 06/02/21 0452   06/01/21  2200  cefTRIAXone (ROCEPHIN) 1 g in sodium chloride 0.9 % 100 mL IVPB        1 g 200 mL/hr over 30 Minutes Intravenous  Once 06/01/21 2150 06/02/21 0039       Subjective:  Patient seen and examined at the bedside this morning.  Hemodynamically stable.  Lying on the bed as before.  Sitter at bedside   Objective: Vitals:   06/24/21 1531 06/24/21 1915 06/24/21 2010 06/25/21 0624  BP: (!) 122/107 120/69  127/80  Pulse: 86 98 94 84  Resp: 14 15  18   Temp:  97.6 F (36.4  C)  98.7 F (37.1 C)  TempSrc:      SpO2: 99% (!) 59% 100%   Weight:      Height:        Intake/Output Summary (Last 24 hours) at 06/25/2021 0741 Last data filed at 06/24/2021 1852 Gross per 24 hour  Intake 720 ml  Output --  Net 720 ml   Filed Weights   06/01/21 1856 06/16/21 1507  Weight: 61.2 kg 63.6 kg    Examination:  General exam: Comfortable, lying on bed   Data Reviewed: I have personally reviewed following labs and imaging studies  CBC: Recent Labs  Lab 06/20/21 0555  WBC 9.4  HGB 11.9*  HCT 34.5*  MCV 113.5*  PLT 366   Basic Metabolic Panel: No results for input(s): NA, K, CL, CO2, GLUCOSE, BUN, CREATININE, CALCIUM, MG, PHOS in the last 168 hours.  GFR: Estimated Creatinine Clearance: 59.9 mL/min (by C-G formula based on SCr of 0.76 mg/dL). Liver Function Tests: No results for input(s): AST, ALT, ALKPHOS, BILITOT, PROT, ALBUMIN in the last 168 hours.  No results for input(s): LIPASE, AMYLASE in the last 168 hours. No results for input(s): AMMONIA in the last 168 hours. Coagulation Profile: No results for input(s): INR, PROTIME in the last 168 hours. Cardiac Enzymes: No results for input(s): CKTOTAL, CKMB, CKMBINDEX, TROPONINI in the last 168 hours.  BNP (last 3 results) No results for input(s): PROBNP in the last 8760 hours. HbA1C: No results for input(s): HGBA1C in the last 72 hours. CBG: No results for input(s): GLUCAP in the last 168 hours. Lipid Profile: No results for input(s): CHOL, HDL, LDLCALC, TRIG, CHOLHDL, LDLDIRECT in the last 72 hours. Thyroid Function Tests: No results for input(s): TSH, T4TOTAL, FREET4, T3FREE, THYROIDAB in the last 72 hours. Anemia Panel: No results for input(s): VITAMINB12, FOLATE, FERRITIN, TIBC, IRON, RETICCTPCT in the last 72 hours. Sepsis Labs: No results for input(s): PROCALCITON, LATICACIDVEN in the last 168 hours.   No results found for this or any previous visit (from the past 240 hour(s)).         Radiology Studies: No results found.      Scheduled Meds:  divalproex  500 mg Oral BID   enoxaparin (LOVENOX) injection  40 mg Subcutaneous Q24H   feeding supplement  237 mL Oral BID BM   hydrocortisone cream  1 application Topical TID   senna-docusate  2 tablet Oral BID   ziprasidone  20 mg Oral BID WC   Continuous Infusions:   LOS: 22 days    Time spent: 15 mins.More than 50% of that time was spent in counseling and/or coordination of care.      08/20/21, MD [facility] P9/08/2021, 7:41 AM

## 2021-06-26 DIAGNOSIS — F72 Severe intellectual disabilities: Secondary | ICD-10-CM

## 2021-06-26 MED ORDER — DIAZEPAM 5 MG/ML IJ SOLN
2.5000 mg | Freq: Four times a day (QID) | INTRAMUSCULAR | Status: DC | PRN
  Administered 2021-07-01 – 2021-07-15 (×19): 2.5 mg via INTRAVENOUS
  Filled 2021-06-26 (×19): qty 2

## 2021-06-26 MED ORDER — HYDRALAZINE HCL 20 MG/ML IJ SOLN: 10.0000 mg | Freq: Four times a day (QID) | INTRAMUSCULAR | Status: DC | PRN

## 2021-06-26 NOTE — Consult Note (Signed)
[facility] Face-to-Face Psychiatry Consult   Reason for Consult: Consult for 65 year old woman with severe intellectual disability because of agitation Referring Physician:  Adhikari Patient Identification: Sabrina Johnston MRN:  [id number] Principal Diagnosis: Severe intellectual disability Diagnosis:  Principal Problem:   Severe intellectual disability Active Problems:   Generalized epilepsy (HCC)   Bacteremia due to Gram-negative bacteria   Abnormal TSH   Pressure ulcer with abrasion, blister, partial thickness skin loss involving epidermis and/or dermis (HCC)   Total Time spent with patient: 1 hour  Subjective:   Sabrina Johnston is a 65 y.o. female patient admitted with patient is nonverbal.  HPI: 65 year old woman with severe intellectual disability who has been in the hospital for an extended stay for metabolic encephalopathy and sepsis.  Consult was placed today reporting that she continues to have agitation.  Patient seen.  Spoke with the sitter at bedside.  Sitter has known the patient for a while and says that she does not see that she is much different than usual although when she awakes she may be trying to climb out of bed a little more frequently.  Patient is nonverbal at baseline unable to communicate.  Even when awake unable to respond in any kind of meaningful way.  Currently still receiving very low-dose Geodon no other active psychiatric medicine  Past Psychiatric History: Past history of longstanding lifelong presumably severe intellectual disability.  Risk to Self:   Risk to Others:   Prior Inpatient Therapy:   Prior Outpatient Therapy:    Past Medical History:  Past Medical History:  Diagnosis Date   Menopause    Mental retardation    PMB (postmenopausal bleeding)     Past Surgical History:  Procedure Laterality Date   HYSTEROSCOPY WITH D & C  2013   b9 endometrium wiht polyps   Family History:  Family History  Family history unknown: Yes   Family Psychiatric   History: None reported Social History:  Social History   Substance and Sexual Activity  Alcohol Use No     Social History   Substance and Sexual Activity  Drug Use No    Social History   Socioeconomic History   Marital status: Single    Spouse name: Not on file   Number of children: Not on file   Years of education: Not on file   Highest education level: Not on file  Occupational History   Not on file  Tobacco Use   Smoking status: Never   Smokeless tobacco: Never  Vaping Use   Vaping Use: Never used  Substance and Sexual Activity   Alcohol use: No   Drug use: No   Sexual activity: Never  Other Topics Concern   Not on file  Social History Narrative   Not on file   Social Determinants of Health   Financial Resource Strain: Not on file  Food Insecurity: Not on file  Transportation Needs: Not on file  Physical Activity: Not on file  Stress: Not on file  Social Connections: Not on file   Additional Social History:    Allergies:  No Known Allergies  Labs: No results found for this or any previous visit (from the past 48 hour(s)).  Current Facility-Administered Medications  Medication Dose Route Frequency Provider Last Rate Last Admin   acetaminophen (TYLENOL) tablet 650 mg  650 mg Oral Q6H PRN Agbata, Tochukwu, MD   650 mg at 06/24/21 0146   Or   acetaminophen (TYLENOL) suppository 650 mg  650 mg Rectal  Q6H PRN Lucile Shutters, MD   650 mg at 06/02/21 1527   diazepam (VALIUM) injection 2.5 mg  2.5 mg Intravenous Q6H PRN Camelle Henkels T, MD       divalproex (DEPAKOTE) DR tablet 500 mg  500 mg Oral BID Lewie Chamber, MD   500 mg at 06/26/21 0854   enoxaparin (LOVENOX) injection 40 mg  40 mg Subcutaneous Q24H Charise Killian, MD   40 mg at 06/25/21 2148   feeding supplement (ENSURE ENLIVE / ENSURE PLUS) liquid 237 mL  237 mL Oral BID BM Danford, Earl Lites, MD   237 mL at 06/26/21 1259   hydrALAZINE (APRESOLINE) injection 10 mg  10 mg Intravenous Q6H PRN  Burnadette Pop, MD       hydrocortisone cream 1 % 1 application  1 application Topical TID Burnadette Pop, MD   1 application at 06/26/21 1622   ondansetron (ZOFRAN) tablet 4 mg  4 mg Oral Q6H PRN Agbata, Tochukwu, MD       Or   ondansetron (ZOFRAN) injection 4 mg  4 mg Intravenous Q6H PRN Agbata, Tochukwu, MD       senna-docusate (Senokot-S) tablet 2 tablet  2 tablet Oral BID Alberteen Sam, MD   2 tablet at 06/26/21 1259   ziprasidone (GEODON) capsule 20 mg  20 mg Oral BID WC Charise Killian, MD   20 mg at 06/26/21 1621   ziprasidone (GEODON) injection 10 mg  10 mg Intramuscular Q6H PRN Mansy, Jan A, MD   10 mg at 06/26/21 0615    Musculoskeletal: Strength & Muscle Tone: within normal limits Gait & Station: unable to stand Patient leans: N/A            Psychiatric Specialty Exam:  Presentation  General Appearance:  No data recorded Eye Contact: No data recorded Speech: No data recorded Speech Volume: No data recorded Handedness: No data recorded  Mood and Affect  Mood: No data recorded Affect: No data recorded  Thought Process  Thought Processes: No data recorded Descriptions of Associations:No data recorded Orientation:No data recorded Thought Content:No data recorded History of Schizophrenia/Schizoaffective disorder:No data recorded Duration of Psychotic Symptoms:No data recorded Hallucinations:No data recorded Ideas of Reference:No data recorded Suicidal Thoughts:No data recorded Homicidal Thoughts:No data recorded  Sensorium  Memory: No data recorded Judgment: No data recorded Insight: No data recorded  Executive Functions  Concentration: No data recorded Attention Span: No data recorded Recall: No data recorded Fund of Knowledge: No data recorded Language: No data recorded  Psychomotor Activity  Psychomotor Activity: No data recorded  Assets  Assets: No data recorded  Sleep  Sleep: No data  recorded  Physical Exam: Physical Exam Vitals and nursing note reviewed.  Constitutional:      Appearance: Normal appearance.  HENT:     Head: Normocephalic and atraumatic.     Mouth/Throat:     Pharynx: Oropharynx is clear.  Eyes:     Pupils: Pupils are equal, round, and reactive to light.  Cardiovascular:     Rate and Rhythm: Normal rate and regular rhythm.  Pulmonary:     Effort: Pulmonary effort is normal.     Breath sounds: Normal breath sounds.  Abdominal:     General: Abdomen is flat.     Palpations: Abdomen is soft.  Musculoskeletal:        General: Normal range of motion.  Skin:    General: Skin is warm and dry.  Neurological:     General: No focal deficit  present.     Mental Status: She is alert. Mental status is at baseline.  Psychiatric:        Attention and Perception: She is inattentive.        Speech: She is noncommunicative.   Review of Systems  Unable to perform ROS: Patient nonverbal  Blood pressure 108/61, pulse 78, temperature 97.6 F (36.4 C), temperature source Axillary, resp. rate 18, height 5' (1.524 m), weight 63.6 kg, SpO2 100 %. Body mass index is 27.38 kg/m.  Treatment Plan Summary: Plan patient with chronic severe intellectual disability.  Intermittent agitation.  Presumably acute medical problems under control.  Patient not really responsive to any kind of verbal or psychological intervention.  Continue current low-dose Geodon.  Order placed for IV Valium 2.5 mg as needed for agitation.  Disposition:  see above  Mordecai Rasmussen, MD 06/26/2021 4:48 PM

## 2021-06-26 NOTE — Progress Notes (Signed)
PROGRESS NOTE    Sabrina Johnston  URK:[id number] DOB: 1956-10-02 DOA: 06/01/2021 PCP: Lauro Regulus, MD   Chief Complain: Fall, agitation  Brief Narrative:  Patient is a 65 year old female with intellectual delay/disability, seizures, recent E. coli bacteremia who presented from group home with falls, agitation.  She was recently admitted here and was found to have E. coli UTI/bacteremia, treated with IV antibiotics and was discharged to group home.  Group home returned the patient here because she was too difficult to manage.  Since then she is waiting for placement.  TOC following.  Long length of stay.  We also consulted psychiatry today for medication adjustment, she is  still agitated on current Geodon dose  Assessment & Plan:   Active Problems:   Generalized epilepsy (HCC)   Severe intellectual disability   Bacteremia due to Gram-negative bacteria   Abnormal TSH   Pressure ulcer with abrasion, blister, partial thickness skin loss involving epidermis and/or dermis (HCC)   Advanced cognitive impairment at baseline/intellectual/developmental delay: At baseline, she has limited cognitive and neurobehavioral function.  As per her caretaker, she was able to ambulate, dependent but cooperative for cares.  She is nonverbal at baseline.  After she was discharged from here after being treated for E. coli bacteremia, she has been resistant to cares, agitated, noncompliant.  Extensive work-up was done given her change in the behavior.  Neurology was also consulted.  Patient has declined cognitively from her baseline. No further work-up planned.   Continue delirium precautions. TOC is following for placement. Her behavioral status continues to decline and  she continues to be  agitated on current medication,  consulting psychiatry.  History of epilepsy: No seizures since admission.  On Depakote  AKI: Resolved with IV fluids  Macrocytic anemia: Vitamin B12 normal.  TSH slightly high  but T3 and T4 normal.  Continue to monitor.  Hemoglobin stable  Rhabdomyolysis: Resolved  Constipation: Continue bowel regimen  Right index finger skin lesion: Improved .  No finding of cellulitis.  No indication for antibiotic therapy  Generalized macular rash: Unclear etiology.  She was seen scratching his skin.  Continue hydrocortisone as needed.Much improved now    Nutrition Problem: Inadequate oral intake Etiology:  (altered mental status)      DVT prophylaxis:Lovenox Code Status: Full Family Communication: None at the bedside Status is: Inpatient  Remains inpatient appropriate because:Unsafe d/c plan  Dispo: The patient is from: Group home              Anticipated d/c is to: Group home              Patient currently is medically stable to d/c.   Difficult to place patient Yes      Consultants: Neurology  Procedures:None  Antimicrobials:  Anti-infectives (From admission, onward)    Start     Dose/Rate Route Frequency Ordered Stop   06/04/21 1000  cephALEXin (KEFLEX) capsule 500 mg        500 mg Oral 4 times daily 06/04/21 0811 06/08/21 2329   06/02/21 1000  cefTRIAXone (ROCEPHIN) 2 g in sodium chloride 0.9 % 100 mL IVPB  Status:  Discontinued        2 g 200 mL/hr over 30 Minutes Intravenous Every 24 hours 06/02/21 0950 06/04/21 0810   06/02/21 0345  cefTRIAXone (ROCEPHIN) 1 g in sodium chloride 0.9 % 100 mL IVPB        1 g 200 mL/hr over 30 Minutes Intravenous  Once 06/02/21 0330  06/02/21 0452   06/01/21 2200  cefTRIAXone (ROCEPHIN) 1 g in sodium chloride 0.9 % 100 mL IVPB        1 g 200 mL/hr over 30 Minutes Intravenous  Once 06/01/21 2150 06/02/21 0039       Subjective:  Patient seen and examined at the bedside this morning.  Sitter at the bedside.  She was on bed same  like yesterday.  Not severely agitated but was moving her extremities.  Nonverbal   Objective: Vitals:   06/25/21 1153 06/26/21 0813 06/26/21 0833 06/26/21 1000  BP: (!) 145/110  (!) 170/82  (!) 162/92  Pulse: 96 92    Resp: 18 20    Temp: 97.8 F (36.6 C)  98.2 F (36.8 C)   TempSrc:   Axillary   SpO2: 99% 94%    Weight:      Height:        Intake/Output Summary (Last 24 hours) at 06/26/2021 1147 Last data filed at 06/25/2021 1406 Gross per 24 hour  Intake 240 ml  Output --  Net 240 ml   Filed Weights   06/01/21 1856 06/16/21 1507  Weight: 61.2 kg 63.6 kg    Examination:  General exam: Overall comfortable, lying on bed, not severely agitated but moving on bed   Data Reviewed: I have personally reviewed following labs and imaging studies  CBC: Recent Labs  Lab 06/20/21 0555  WBC 9.4  HGB 11.9*  HCT 34.5*  MCV 113.5*  PLT 366   Basic Metabolic Panel: No results for input(s): NA, K, CL, CO2, GLUCOSE, BUN, CREATININE, CALCIUM, MG, PHOS in the last 168 hours.  GFR: Estimated Creatinine Clearance: 59.9 mL/min (by C-G formula based on SCr of 0.76 mg/dL). Liver Function Tests: No results for input(s): AST, ALT, ALKPHOS, BILITOT, PROT, ALBUMIN in the last 168 hours.  No results for input(s): LIPASE, AMYLASE in the last 168 hours. No results for input(s): AMMONIA in the last 168 hours. Coagulation Profile: No results for input(s): INR, PROTIME in the last 168 hours. Cardiac Enzymes: No results for input(s): CKTOTAL, CKMB, CKMBINDEX, TROPONINI in the last 168 hours.  BNP (last 3 results) No results for input(s): PROBNP in the last 8760 hours. HbA1C: No results for input(s): HGBA1C in the last 72 hours. CBG: No results for input(s): GLUCAP in the last 168 hours. Lipid Profile: No results for input(s): CHOL, HDL, LDLCALC, TRIG, CHOLHDL, LDLDIRECT in the last 72 hours. Thyroid Function Tests: No results for input(s): TSH, T4TOTAL, FREET4, T3FREE, THYROIDAB in the last 72 hours. Anemia Panel: No results for input(s): VITAMINB12, FOLATE, FERRITIN, TIBC, IRON, RETICCTPCT in the last 72 hours. Sepsis Labs: No results for input(s):  PROCALCITON, LATICACIDVEN in the last 168 hours.   No results found for this or any previous visit (from the past 240 hour(s)).        Radiology Studies: No results found.      Scheduled Meds:  divalproex  500 mg Oral BID   enoxaparin (LOVENOX) injection  40 mg Subcutaneous Q24H   feeding supplement  237 mL Oral BID BM   hydrocortisone cream  1 application Topical TID   senna-docusate  2 tablet Oral BID   ziprasidone  20 mg Oral BID WC   Continuous Infusions:   LOS: 23 days    Time spent: 15 mins.More than 50% of that time was spent in counseling and/or coordination of care.      Burnadette Pop, MD [facility] P9/09/2021, 11:47 AM

## 2021-06-26 NOTE — TOC Progression Note (Signed)
Transition of Care [facility]) - Progression Note    Patient Details  Name: Sabrina Johnston MRN: [id number] Date of Birth: 05/25/1956  Transition of Care [facility]) CM/SW Contact  Allayne Butcher, RN Phone Number: 06/26/2021, 1:19 PM  Clinical Narrative:    RNCM spoke with [facility] worker on Friday and they were going to try and assign patient to someone that can assist with placement.  Patient does not have a current care coordinator assigned with [facility].          Expected Discharge Plan and Services                                                 Social Determinants of Health (SDOH) Interventions    Readmission Risk Interventions Readmission Risk Prevention Plan 06/06/2021  Post Dischage Appt Complete  Medication Screening Complete  Transportation Screening Complete  Some recent data might be hidden

## 2021-06-27 ENCOUNTER — Encounter (INDEPENDENT_AMBULATORY_CARE_PROVIDER_SITE_OTHER): Payer: Medicare Other | Admitting: Obstetrics and Gynecology

## 2021-06-27 DIAGNOSIS — Z1231 Encounter for screening mammogram for malignant neoplasm of breast: Secondary | ICD-10-CM

## 2021-06-27 DIAGNOSIS — Z01419 Encounter for gynecological examination (general) (routine) without abnormal findings: Secondary | ICD-10-CM

## 2021-06-27 DIAGNOSIS — Z124 Encounter for screening for malignant neoplasm of cervix: Secondary | ICD-10-CM

## 2021-06-27 NOTE — Progress Notes (Signed)
PROGRESS NOTE    [name]  RXV:[id number] DOB: 08-12-1956 DOA: 06/01/2021 PCP: Lauro Regulus, MD   Chief Complain: Fall, agitation  Brief Narrative:  Patient is a 65 year old female with intellectual delay/disability, seizures, recent E. coli bacteremia who presented from group home with falls, agitation.  She was recently admitted here and was found to have E. coli UTI/bacteremia, treated with IV antibiotics and was discharged to group home.  Group home returned the patient here because she was too difficult to manage.  Since then she is waiting for placement.  TOC following.  Long length of stay.  We also consulted psychiatry for medication adjustment.  Assessment & Plan:   Principal Problem:   Severe intellectual disability Active Problems:   Generalized epilepsy (HCC)   Bacteremia due to Gram-negative bacteria   Abnormal TSH   Pressure ulcer with abrasion, blister, partial thickness skin loss involving epidermis and/or dermis (HCC)   Advanced cognitive impairment at baseline/intellectual/developmental delay: At baseline, she has limited cognitive and neurobehavioral function.  As per her caretaker, she was able to ambulate, dependent but cooperative for cares.  She is nonverbal at baseline.  After she was discharged from here after being treated for E. coli bacteremia, she has been resistant to cares, agitated, noncompliant.  Extensive work-up was done given her change in the behavior.  Neurology was also consulted.  Patient has declined cognitively from her baseline. No further work-up planned.   TOC is following for placement. Her behavioral status continues to decline and  she continues to be intermittently   agitated on current medication.  Psychiatry consulted.  Currently on IV Valium 2.5 mg as needed for agitation  History of epilepsy: No seizures since admission.  On Depakote  AKI: Resolved with IV fluids  Macrocytic anemia: Vitamin B12 normal.  TSH slightly  high but T3 and T4 normal.  Continue to monitor.  Hemoglobin stable  Rhabdomyolysis: Resolved  Constipation: Continue bowel regimen  Right index finger skin lesion: Improved .  No finding of cellulitis.  No indication for antibiotic therapy  Generalized macular rash: Unclear etiology.  She was seen scratching his skin.  Continue hydrocortisone as needed.Much improved now    Nutrition Problem: Inadequate oral intake Etiology:  (altered mental status)      DVT prophylaxis:Lovenox Code Status: Full Family Communication: None at the bedside Status is: Inpatient  Remains inpatient appropriate because:Unsafe d/c plan  Dispo: The patient is from: Group home              Anticipated d/c is to: Group home              Patient currently is medically stable to d/c.   Difficult to place patient Yes      Consultants: Neurology  Procedures:None  Antimicrobials:  Anti-infectives (From admission, onward)    Start     Dose/Rate Route Frequency Ordered Stop   06/04/21 1000  cephALEXin (KEFLEX) capsule 500 mg        500 mg Oral 4 times daily 06/04/21 0811 06/08/21 2329   06/02/21 1000  cefTRIAXone (ROCEPHIN) 2 g in sodium chloride 0.9 % 100 mL IVPB  Status:  Discontinued        2 g 200 mL/hr over 30 Minutes Intravenous Every 24 hours 06/02/21 0950 06/04/21 0810   06/02/21 0345  cefTRIAXone (ROCEPHIN) 1 g in sodium chloride 0.9 % 100 mL IVPB        1 g 200 mL/hr over 30 Minutes Intravenous  Once  06/02/21 0330 06/02/21 0452   06/01/21 2200  cefTRIAXone (ROCEPHIN) 1 g in sodium chloride 0.9 % 100 mL IVPB        1 g 200 mL/hr over 30 Minutes Intravenous  Once 06/01/21 2150 06/02/21 0039       Subjective:  Patient seen and examined at the bedside this morning.  Same like yesterday, lying on the bed, mildly agitated.  Sitter at the bedside.  Appears comfortable   Objective: Vitals:   06/26/21 1000 06/26/21 1200 06/26/21 2026 06/27/21 0400  BP: (!) 162/92 108/61 (!) 144/122 (!)  160/80  Pulse:  78 87   Resp:  18 20   Temp:  97.6 F (36.4 C)    TempSrc:  Axillary    SpO2:  100% 97%   Weight:      Height:       No intake or output data in the 24 hours ending 06/27/21 0735  Filed Weights   06/01/21 1856 06/16/21 1507  Weight: 61.2 kg 63.6 kg    Examination:  General exam: Overall comfortable, moving on the bed/mildly agaitated, nonverbal  Data Reviewed: I have personally reviewed following labs and imaging studies  CBC: No results for input(s): WBC, NEUTROABS, HGB, HCT, MCV, PLT in the last 168 hours.  Basic Metabolic Panel: No results for input(s): NA, K, CL, CO2, GLUCOSE, BUN, CREATININE, CALCIUM, MG, PHOS in the last 168 hours.  GFR: Estimated Creatinine Clearance: 59.9 mL/min (by C-G formula based on SCr of 0.76 mg/dL). Liver Function Tests: No results for input(s): AST, ALT, ALKPHOS, BILITOT, PROT, ALBUMIN in the last 168 hours.  No results for input(s): LIPASE, AMYLASE in the last 168 hours. No results for input(s): AMMONIA in the last 168 hours. Coagulation Profile: No results for input(s): INR, PROTIME in the last 168 hours. Cardiac Enzymes: No results for input(s): CKTOTAL, CKMB, CKMBINDEX, TROPONINI in the last 168 hours.  BNP (last 3 results) No results for input(s): PROBNP in the last 8760 hours. HbA1C: No results for input(s): HGBA1C in the last 72 hours. CBG: No results for input(s): GLUCAP in the last 168 hours. Lipid Profile: No results for input(s): CHOL, HDL, LDLCALC, TRIG, CHOLHDL, LDLDIRECT in the last 72 hours. Thyroid Function Tests: No results for input(s): TSH, T4TOTAL, FREET4, T3FREE, THYROIDAB in the last 72 hours. Anemia Panel: No results for input(s): VITAMINB12, FOLATE, FERRITIN, TIBC, IRON, RETICCTPCT in the last 72 hours. Sepsis Labs: No results for input(s): PROCALCITON, LATICACIDVEN in the last 168 hours.   No results found for this or any previous visit (from the past 240 hour(s)).         Radiology Studies: No results found.      Scheduled Meds:  divalproex  500 mg Oral BID   enoxaparin (LOVENOX) injection  40 mg Subcutaneous Q24H   feeding supplement  237 mL Oral BID BM   hydrocortisone cream  1 application Topical TID   senna-docusate  2 tablet Oral BID   ziprasidone  20 mg Oral BID WC   Continuous Infusions:   LOS: 24 days    Time spent: 15 mins.More than 50% of that time was spent in counseling and/or coordination of care.      Burnadette Pop, MD [facility] P9/13/2022, 7:35 AM

## 2021-06-28 LAB — MAGNESIUM: Magnesium: 2.5 mg/dL — ABNORMAL HIGH (ref 1.7–2.4)

## 2021-06-28 LAB — PHOSPHORUS: Phosphorus: 4.8 mg/dL — ABNORMAL HIGH (ref 2.5–4.6)

## 2021-06-28 MED ORDER — POTASSIUM CHLORIDE CRYS ER 20 MEQ PO TBCR
40.0000 meq | EXTENDED_RELEASE_TABLET | Freq: Once | ORAL | Status: AC
  Administered 2021-06-28: 40 meq via ORAL
  Filled 2021-06-28: qty 2

## 2021-06-28 NOTE — Progress Notes (Signed)
PROGRESS NOTE    Sabrina Johnston  RXV:[id number] DOB: 08-12-1956 DOA: 06/01/2021 PCP: Lauro Regulus, MD   Chief Complain: Fall, agitation  Brief Narrative:  Patient is a 65 year old female with intellectual delay/disability, seizures, recent E. coli bacteremia who presented from group home with falls, agitation.  She was recently admitted here and was found to have E. coli UTI/bacteremia, treated with IV antibiotics and was discharged to group home.  Group home returned the patient here because she was too difficult to manage.  Since then she is waiting for placement.  TOC following.  Long length of stay.  We also consulted psychiatry for medication adjustment.  Assessment & Plan:   Principal Problem:   Severe intellectual disability Active Problems:   Generalized epilepsy (HCC)   Bacteremia due to Gram-negative bacteria   Abnormal TSH   Pressure ulcer with abrasion, blister, partial thickness skin loss involving epidermis and/or dermis (HCC)   Advanced cognitive impairment at baseline/intellectual/developmental delay: At baseline, she has limited cognitive and neurobehavioral function.  As per her caretaker, she was able to ambulate, dependent but cooperative for cares.  She is nonverbal at baseline.  After she was discharged from here after being treated for E. coli bacteremia, she has been resistant to cares, agitated, noncompliant.  Extensive work-up was done given her change in the behavior.  Neurology was also consulted.  Patient has declined cognitively from her baseline. No further work-up planned.   TOC is following for placement. Her behavioral status continues to decline and  she continues to be intermittently   agitated on current medication.  Psychiatry consulted.  Currently on IV Valium 2.5 mg as needed for agitation  History of epilepsy: No seizures since admission.  On Depakote  AKI: Resolved with IV fluids  Macrocytic anemia: Vitamin B12 normal.  TSH slightly  high but T3 and T4 normal.  Continue to monitor.  Hemoglobin stable  Rhabdomyolysis: Resolved  Constipation: Continue bowel regimen  Right index finger skin lesion: Improved .  No finding of cellulitis.  No indication for antibiotic therapy  Generalized macular rash: Unclear etiology.  She was seen scratching his skin.  Continue hydrocortisone as needed.Much improved now    Nutrition Problem: Inadequate oral intake Etiology:  (altered mental status)      DVT prophylaxis:Lovenox Code Status: Full Family Communication: None at the bedside Status is: Inpatient  Remains inpatient appropriate because:Unsafe d/c plan  Dispo: The patient is from: Group home              Anticipated d/c is to: Group home              Patient currently is medically stable to d/c.   Difficult to place patient Yes      Consultants: Neurology  Procedures:None  Antimicrobials:  Anti-infectives (From admission, onward)    Start     Dose/Rate Route Frequency Ordered Stop   06/04/21 1000  cephALEXin (KEFLEX) capsule 500 mg        500 mg Oral 4 times daily 06/04/21 0811 06/08/21 2329   06/02/21 1000  cefTRIAXone (ROCEPHIN) 2 g in sodium chloride 0.9 % 100 mL IVPB  Status:  Discontinued        2 g 200 mL/hr over 30 Minutes Intravenous Every 24 hours 06/02/21 0950 06/04/21 0810   06/02/21 0345  cefTRIAXone (ROCEPHIN) 1 g in sodium chloride 0.9 % 100 mL IVPB        1 g 200 mL/hr over 30 Minutes Intravenous  Once  06/02/21 0330 06/02/21 0452   06/01/21 2200  cefTRIAXone (ROCEPHIN) 1 g in sodium chloride 0.9 % 100 mL IVPB        1 g 200 mL/hr over 30 Minutes Intravenous  Once 06/01/21 2150 06/02/21 0039       Subjective:  Patient seen and examined at the bedside this morning.  lying on the bed, mildly agitated.  Sitter at the bedside.  Appears comfortable   Objective: Vitals:   06/27/21 1924 06/28/21 0342 06/28/21 0716 06/28/21 1157  BP: 136/85 114/61 121/87 132/86  Pulse: 92 83 86 (!) 102   Resp: 20 20 17 17   Temp: 97.8 F (36.6 C) 98.6 F (37 C) 97.7 F (36.5 C) 97.8 F (36.6 C)  TempSrc: Oral Oral Axillary   SpO2: 99% 98% 98% (!) 81%  Weight:      Height:        Intake/Output Summary (Last 24 hours) at 06/28/2021 1446 Last data filed at 06/27/2021 1700 Gross per 24 hour  Intake 360 ml  Output --  Net 360 ml    Filed Weights   06/01/21 1856 06/16/21 1507  Weight: 61.2 kg 63.6 kg    Examination:  General exam: Overall comfortable, moving on the bed/mildly agaitated, nonverbal  Data Reviewed: I have personally reviewed following labs and imaging studies  CBC: No results for input(s): WBC, NEUTROABS, HGB, HCT, MCV, PLT in the last 168 hours.  Basic Metabolic Panel: Recent Labs  Lab 06/28/21 0921  MG 2.5*  PHOS 4.8*    GFR: Estimated Creatinine Clearance: 59.9 mL/min (by C-G formula based on SCr of 0.76 mg/dL). Liver Function Tests: No results for input(s): AST, ALT, ALKPHOS, BILITOT, PROT, ALBUMIN in the last 168 hours.  No results for input(s): LIPASE, AMYLASE in the last 168 hours. No results for input(s): AMMONIA in the last 168 hours. Coagulation Profile: No results for input(s): INR, PROTIME in the last 168 hours. Cardiac Enzymes: No results for input(s): CKTOTAL, CKMB, CKMBINDEX, TROPONINI in the last 168 hours.  BNP (last 3 results) No results for input(s): PROBNP in the last 8760 hours. HbA1C: No results for input(s): HGBA1C in the last 72 hours. CBG: No results for input(s): GLUCAP in the last 168 hours. Lipid Profile: No results for input(s): CHOL, HDL, LDLCALC, TRIG, CHOLHDL, LDLDIRECT in the last 72 hours. Thyroid Function Tests: No results for input(s): TSH, T4TOTAL, FREET4, T3FREE, THYROIDAB in the last 72 hours. Anemia Panel: No results for input(s): VITAMINB12, FOLATE, FERRITIN, TIBC, IRON, RETICCTPCT in the last 72 hours. Sepsis Labs: No results for input(s): PROCALCITON, LATICACIDVEN in the last 168 hours.   No  results found for this or any previous visit (from the past 240 hour(s)).        Radiology Studies: No results found.      Scheduled Meds:  divalproex  500 mg Oral BID   enoxaparin (LOVENOX) injection  40 mg Subcutaneous Q24H   feeding supplement  237 mL Oral BID BM   hydrocortisone cream  1 application Topical TID   senna-docusate  2 tablet Oral BID   ziprasidone  20 mg Oral BID WC   Continuous Infusions:   LOS: 25 days    Time spent: 15 mins.More than 50% of that time was spent in counseling and/or coordination of care.      06/30/21, MD [facility] P9/14/2022, 2:46 PM

## 2021-06-28 NOTE — TOC Progression Note (Signed)
Transition of Care [facility]) - Progression Note    Patient Details  Name: Sabrina Johnston MRN: [id number] Date of Birth: [birth year]  [facility] CM/SW Contact  Allayne Butcher, RN Phone Number: 06/28/2021, 2:21 PM  Clinical Narrative:    Joanne Chars with [facility] will assist with care coordination while patient is in the hospital.  She will assist with placement.  Patient will need specialized care.         Expected Discharge Plan and Services                                                 Social Determinants of Health (SDOH) Interventions    Readmission Risk Interventions Readmission Risk Prevention Plan 06/06/2021  Post Dischage Appt Complete  Medication Screening Complete  Transportation Screening Complete  Some recent data might be hidden

## 2021-06-29 LAB — BASIC METABOLIC PANEL
Anion gap: 8 (ref 5–15)
BUN: 11 mg/dL (ref 8–23)
CO2: 25 mmol/L (ref 22–32)
Calcium: 8.6 mg/dL — ABNORMAL LOW (ref 8.9–10.3)
Chloride: 108 mmol/L (ref 98–111)
Creatinine, Ser: 0.76 mg/dL (ref 0.44–1.00)
GFR, Estimated: >60 mL/min (ref >60)
Glucose, Bld: 96 mg/dL (ref 70–99)
Potassium: 4.3 mmol/L (ref 3.5–5.1)
Sodium: 141 mmol/L (ref 135–145)

## 2021-06-29 LAB — CBC
HCT: 38 % (ref 36.0–46.0)
Hemoglobin: 12.4 g/dL (ref 12.0–15.0)
MCH: 37.2 pg — ABNORMAL HIGH (ref 26.0–34.0)
MCHC: 32.6 g/dL (ref 30.0–36.0)
MCV: 114.1 fL — ABNORMAL HIGH (ref 80.0–100.0)
Platelets: 356 10*3/uL (ref 150–400)
RBC: 3.33 MIL/uL — ABNORMAL LOW (ref 3.87–5.11)
RDW: 17.5 % — ABNORMAL HIGH (ref 11.5–15.5)
WBC: 11.9 10*3/uL — ABNORMAL HIGH (ref 4.0–10.5)
nRBC: 0 % (ref 0.0–0.2)

## 2021-06-29 LAB — MAGNESIUM: Magnesium: 2.4 mg/dL (ref 1.7–2.4)

## 2021-06-29 LAB — AMMONIA: Ammonia: 36 umol/L — ABNORMAL HIGH (ref 9–35)

## 2021-06-29 LAB — PHOSPHORUS: Phosphorus: 4.7 mg/dL — ABNORMAL HIGH (ref 2.5–4.6)

## 2021-06-29 NOTE — Plan of Care (Signed)

## 2021-06-29 NOTE — Progress Notes (Signed)
Nutrition Follow-up  DOCUMENTATION CODES:  Not applicable  INTERVENTION:  Continue current diet as ordered, nursing staff to assist with meals Ensure Enlive po BID, each supplement provides 350 kcal and 20 grams of protein Request new measured weight  NUTRITION DIAGNOSIS:  Inadequate oral intake related to  (altered mental status) as evidenced by  (routinely eating <75% of meals).  GOAL:  Patient will meet greater than or equal to 90% of their needs  MONITOR:  PO intake, Supplement acceptance  REASON FOR ASSESSMENT:  Consult Calorie Count  ASSESSMENT:  65 y.o. female presented to the ED with AMS and falls after being discharged back to her group home 4 hours prior to return. PMH of mental retardation, epilepsy, and chronic constipation. Pt nonverbal at baseline. Previous admission for sepsis due to UTI.  Pt remains medically stable at this time, intake improved from last week. No new weight x 2 weeks, will request new to assess for weight changes.  Discussed in rounds, case management searching for discharge options.   Average Meal Intake: 8/18-9/2: 54% intake x 18 recorded meals (0-100%) 9/3-9/8: 46% intake x 10 recorded meals (0-100%) 9/9-9/15: 81% intake x 10 recorded meals (0-100%)  Nutritionally Relevant Medications: Scheduled Meds:  feeding supplement  237 mL Oral BID BM   senna-docusate  2 tablet Oral BID   Continuous Infusions: PRN Meds: ondansetron  Labs Reviewed  NUTRITION - FOCUSED PHYSICAL EXAM: Flowsheet Row Most Recent Value  Orbital Region No depletion  Upper Arm Region No depletion  Thoracic and Lumbar Region No depletion  Buccal Region No depletion  Temple Region No depletion  Clavicle Bone Region No depletion  Clavicle and Acromion Bone Region No depletion  Scapular Bone Region No depletion  Dorsal Hand No depletion  Patellar Region No depletion  Anterior Thigh Region No depletion  Posterior Calf Region No depletion  Edema (RD Assessment)  None  Hair Reviewed  Eyes Unable to assess  Mouth Unable to assess  Skin Reviewed  Nails Reviewed   Diet Order:   Diet Order             DIET DYS 3 Room service appropriate? Yes; Fluid consistency: Thin  Diet effective now                   EDUCATION NEEDS:  No education needs have been identified at this time  Skin:  Skin Assessment: Reviewed RN Assessment  Last BM:  9/15 - type 4  Height:  Ht Readings from Last 1 Encounters:  06/01/21 5' (1.524 m)   Weight:  Wt Readings from Last 1 Encounters:  06/16/21 63.6 kg   Ideal Body Weight:  45.5 kg  BMI:  Body mass index is 27.38 kg/m.  Estimated Nutritional Needs:  Kcal:  1400-1600 kcal/d Protein:  70-80 g/d Fluid:  1.5-1.8 L/d   Greig Castilla, RD, LDN Clinical Dietitian Pager on Amion

## 2021-06-29 NOTE — Progress Notes (Signed)
PROGRESS NOTE    Sabrina Johnston  RXV:[id number] DOB: 08-12-1956 DOA: 06/01/2021 PCP: Lauro Regulus, MD   Chief Complain: Fall, agitation  Brief Narrative:  Patient is a 65 year old female with intellectual delay/disability, seizures, recent E. coli bacteremia who presented from group home with falls, agitation.  She was recently admitted here and was found to have E. coli UTI/bacteremia, treated with IV antibiotics and was discharged to group home.  Group home returned the patient here because she was too difficult to manage.  Since then she is waiting for placement.  TOC following.  Long length of stay.  We also consulted psychiatry for medication adjustment.  Assessment & Plan:   Principal Problem:   Severe intellectual disability Active Problems:   Generalized epilepsy (HCC)   Bacteremia due to Gram-negative bacteria   Abnormal TSH   Pressure ulcer with abrasion, blister, partial thickness skin loss involving epidermis and/or dermis (HCC)   Advanced cognitive impairment at baseline/intellectual/developmental delay: At baseline, she has limited cognitive and neurobehavioral function.  As per her caretaker, she was able to ambulate, dependent but cooperative for cares.  She is nonverbal at baseline.  After she was discharged from here after being treated for E. coli bacteremia, she has been resistant to cares, agitated, noncompliant.  Extensive work-up was done given her change in the behavior.  Neurology was also consulted.  Patient has declined cognitively from her baseline. No further work-up planned.   TOC is following for placement. Her behavioral status continues to decline and  she continues to be intermittently   agitated on current medication.  Psychiatry consulted.  Currently on IV Valium 2.5 mg as needed for agitation  History of epilepsy: No seizures since admission.  On Depakote  AKI: Resolved with IV fluids  Macrocytic anemia: Vitamin B12 normal.  TSH slightly  high but T3 and T4 normal.  Continue to monitor.  Hemoglobin stable  Rhabdomyolysis: Resolved  Constipation: Continue bowel regimen  Right index finger skin lesion: Improved .  No finding of cellulitis.  No indication for antibiotic therapy  Generalized macular rash: Unclear etiology.  She was seen scratching his skin.  Continue hydrocortisone as needed.Much improved now    Nutrition Problem: Inadequate oral intake Etiology:  (altered mental status)      DVT prophylaxis:Lovenox Code Status: Full Family Communication: None at the bedside Status is: Inpatient  Remains inpatient appropriate because:Unsafe d/c plan  Dispo: The patient is from: Group home              Anticipated d/c is to: Group home              Patient currently is medically stable to d/c.   Difficult to place patient Yes      Consultants: Neurology  Procedures:None  Antimicrobials:  Anti-infectives (From admission, onward)    Start     Dose/Rate Route Frequency Ordered Stop   06/04/21 1000  cephALEXin (KEFLEX) capsule 500 mg        500 mg Oral 4 times daily 06/04/21 0811 06/08/21 2329   06/02/21 1000  cefTRIAXone (ROCEPHIN) 2 g in sodium chloride 0.9 % 100 mL IVPB  Status:  Discontinued        2 g 200 mL/hr over 30 Minutes Intravenous Every 24 hours 06/02/21 0950 06/04/21 0810   06/02/21 0345  cefTRIAXone (ROCEPHIN) 1 g in sodium chloride 0.9 % 100 mL IVPB        1 g 200 mL/hr over 30 Minutes Intravenous  Once  06/02/21 0330 06/02/21 0452   06/01/21 2200  cefTRIAXone (ROCEPHIN) 1 g in sodium chloride 0.9 % 100 mL IVPB        1 g 200 mL/hr over 30 Minutes Intravenous  Once 06/01/21 2150 06/02/21 0039       Subjective:  Patient seen and examined at the bedside this morning.  lying on the bed, mildly agitated.  Sitter at the bedside.  Appears comfortable   Objective: Vitals:   06/29/21 0416 06/29/21 0827 06/29/21 1134 06/29/21 1215  BP: 120/64 (!) 112/50 (!) 108/91   Pulse: 70 75 85    Resp: 18 17 17    Temp: 98 F (36.7 C) 97.8 F (36.6 C) 97.8 F (36.6 C)   TempSrc:      SpO2: 100% (!) 86% 99%   Weight:    60.6 kg  Height:        Intake/Output Summary (Last 24 hours) at 06/29/2021 1556 Last data filed at 06/29/2021 1300 Gross per 24 hour  Intake 480 ml  Output --  Net 480 ml    Filed Weights   06/01/21 1856 06/16/21 1507 06/29/21 1215  Weight: 61.2 kg 63.6 kg 60.6 kg    Examination:  General exam: Overall comfortable, moving on the bed/mildly agaitated, nonverbal  Data Reviewed: I have personally reviewed following labs and imaging studies  CBC: Recent Labs  Lab 06/29/21 0354  WBC 11.9*  HGB 12.4  HCT 38.0  MCV 114.1*  PLT 356    Basic Metabolic Panel: Recent Labs  Lab 06/28/21 0921 06/29/21 0354  NA  --  141  K  --  4.3  CL  --  108  CO2  --  25  GLUCOSE  --  96  BUN  --  11  CREATININE  --  0.76  CALCIUM  --  8.6*  MG 2.5* 2.4  PHOS 4.8* 4.7*    GFR: Estimated Creatinine Clearance: 58.5 mL/min (by C-G formula based on SCr of 0.76 mg/dL). Liver Function Tests: No results for input(s): AST, ALT, ALKPHOS, BILITOT, PROT, ALBUMIN in the last 168 hours.  No results for input(s): LIPASE, AMYLASE in the last 168 hours. Recent Labs  Lab 06/29/21 0354  AMMONIA 36*   Coagulation Profile: No results for input(s): INR, PROTIME in the last 168 hours. Cardiac Enzymes: No results for input(s): CKTOTAL, CKMB, CKMBINDEX, TROPONINI in the last 168 hours.  BNP (last 3 results) No results for input(s): PROBNP in the last 8760 hours. HbA1C: No results for input(s): HGBA1C in the last 72 hours. CBG: No results for input(s): GLUCAP in the last 168 hours. Lipid Profile: No results for input(s): CHOL, HDL, LDLCALC, TRIG, CHOLHDL, LDLDIRECT in the last 72 hours. Thyroid Function Tests: No results for input(s): TSH, T4TOTAL, FREET4, T3FREE, THYROIDAB in the last 72 hours. Anemia Panel: No results for input(s): VITAMINB12, FOLATE,  FERRITIN, TIBC, IRON, RETICCTPCT in the last 72 hours. Sepsis Labs: No results for input(s): PROCALCITON, LATICACIDVEN in the last 168 hours.   No results found for this or any previous visit (from the past 240 hour(s)).        Radiology Studies: No results found.      Scheduled Meds:  divalproex  500 mg Oral BID   enoxaparin (LOVENOX) injection  40 mg Subcutaneous Q24H   feeding supplement  237 mL Oral BID BM   hydrocortisone cream  1 application Topical TID   senna-docusate  2 tablet Oral BID   ziprasidone  20 mg Oral BID WC  Continuous Infusions:   LOS: 26 days    Time spent: 15 mins.More than 50% of that time was spent in counseling and/or coordination of care.      Gillis Santa, MD [facility] P9/15/2022, 3:56 PM

## 2021-06-30 MED ORDER — DIVALPROEX SODIUM 125 MG PO CSDR
500.0000 mg | DELAYED_RELEASE_CAPSULE | Freq: Two times a day (BID) | ORAL | Status: DC
  Administered 2021-06-30 – 2021-08-07 (×75): 500 mg via ORAL
  Filled 2021-06-30 (×84): qty 4

## 2021-06-30 NOTE — Progress Notes (Signed)
PROGRESS NOTE    Sabrina Johnston  TSV:[id number] DOB: 1956-06-28 DOA: 06/01/2021 PCP: Lauro Regulus, MD   Chief Complain: Fall, agitation  Brief Narrative:  Patient is a 65 year old female with intellectual delay/disability, seizures, recent E. coli bacteremia who presented from group home with falls, agitation.  She was recently admitted here and was found to have E. coli UTI/bacteremia, treated with IV antibiotics and was discharged to group home.  Group home returned the patient here because she was too difficult to manage.  Since then she is waiting for placement.  TOC following.  Long length of stay.  We also consulted psychiatry for medication adjustment.  Assessment & Plan:   Principal Problem:   Severe intellectual disability Active Problems:   Generalized epilepsy (HCC)   Bacteremia due to Gram-negative bacteria   Abnormal TSH   Pressure ulcer with abrasion, blister, partial thickness skin loss involving epidermis and/or dermis (HCC)   Advanced cognitive impairment at baseline/intellectual/developmental delay: At baseline, she has limited cognitive and neurobehavioral function.  As per her caretaker, she was able to ambulate, dependent but cooperative for cares.  She is nonverbal at baseline.  After she was discharged from here after being treated for E. coli bacteremia, she has been resistant to cares, agitated, noncompliant.  Extensive work-up was done given her change in the behavior.  Neurology was also consulted.  Patient has declined cognitively from her baseline. No further work-up planned.   TOC is following for placement. Her behavioral status continues to decline and  she continues to be intermittently   agitated on current medication.  Psychiatry consulted.  Currently on IV Valium 2.5 mg as needed for agitation  History of epilepsy: No seizures since admission.  On Depakote  AKI: Resolved with IV fluids  Macrocytic anemia: Vitamin B12 normal.  TSH slightly  high but T3 and T4 normal.  Continue to monitor.  Hemoglobin stable  Rhabdomyolysis: Resolved  Constipation: Continue bowel regimen  Right index finger skin lesion: Improved .  No finding of cellulitis.  No indication for antibiotic therapy  Generalized macular rash: Unclear etiology.  She was seen scratching his skin.  Continue hydrocortisone as needed.Much improved now    Nutrition Problem: Inadequate oral intake Etiology:  (altered mental status)      DVT prophylaxis:Lovenox Code Status: Full Family Communication: None at the bedside Status is: Inpatient  Remains inpatient appropriate because:Unsafe d/c plan  Dispo: The patient is from: Group home              Anticipated d/c is to: Group home              Patient currently is medically stable to d/c.   Difficult to place patient Yes      Consultants: Neurology  Procedures:None  Antimicrobials:  Anti-infectives (From admission, onward)    Start     Dose/Rate Route Frequency Ordered Stop   06/04/21 1000  cephALEXin (KEFLEX) capsule 500 mg        500 mg Oral 4 times daily 06/04/21 0811 06/08/21 2329   06/02/21 1000  cefTRIAXone (ROCEPHIN) 2 g in sodium chloride 0.9 % 100 mL IVPB  Status:  Discontinued        2 g 200 mL/hr over 30 Minutes Intravenous Every 24 hours 06/02/21 0950 06/04/21 0810   06/02/21 0345  cefTRIAXone (ROCEPHIN) 1 g in sodium chloride 0.9 % 100 mL IVPB        1 g 200 mL/hr over 30 Minutes Intravenous  Once  06/02/21 0330 06/02/21 0452   06/01/21 2200  cefTRIAXone (ROCEPHIN) 1 g in sodium chloride 0.9 % 100 mL IVPB        1 g 200 mL/hr over 30 Minutes Intravenous  Once 06/01/21 2150 06/02/21 0039       Subjective:  Patient seen and examined at the bedside this morning.  lying on the bed, mildly agitated.  Sitter at the bedside.  Appears comfortable   Objective: Vitals:   06/30/21 0010 06/30/21 0531 06/30/21 0819 06/30/21 1137  BP: 124/76 125/72 (!) 151/85 120/71  Pulse: 92 98 87 96   Resp: 16 16 19 20   Temp: 98.4 F (36.9 C) 97.7 F (36.5 C) 98.9 F (37.2 C) (!) 96.5 F (35.8 C)  TempSrc: Oral Oral Oral Axillary  SpO2: 100% 96% 95% 98%  Weight:      Height:        Intake/Output Summary (Last 24 hours) at 06/30/2021 1448 Last data filed at 06/30/2021 1410 Gross per 24 hour  Intake 780 ml  Output --  Net 780 ml    Filed Weights   06/01/21 1856 06/16/21 1507 06/29/21 1215  Weight: 61.2 kg 63.6 kg 60.6 kg    Examination:  General exam: Overall comfortable, moving on the bed/mildly agaitated, nonverbal  Data Reviewed: I have personally reviewed following labs and imaging studies  CBC: Recent Labs  Lab 06/29/21 0354  WBC 11.9*  HGB 12.4  HCT 38.0  MCV 114.1*  PLT 356    Basic Metabolic Panel: Recent Labs  Lab 06/28/21 0921 06/29/21 0354  NA  --  141  K  --  4.3  CL  --  108  CO2  --  25  GLUCOSE  --  96  BUN  --  11  CREATININE  --  0.76  CALCIUM  --  8.6*  MG 2.5* 2.4  PHOS 4.8* 4.7*    GFR: Estimated Creatinine Clearance: 58.5 mL/min (by C-G formula based on SCr of 0.76 mg/dL). Liver Function Tests: No results for input(s): AST, ALT, ALKPHOS, BILITOT, PROT, ALBUMIN in the last 168 hours.  No results for input(s): LIPASE, AMYLASE in the last 168 hours. Recent Labs  Lab 06/29/21 0354  AMMONIA 36*   Coagulation Profile: No results for input(s): INR, PROTIME in the last 168 hours. Cardiac Enzymes: No results for input(s): CKTOTAL, CKMB, CKMBINDEX, TROPONINI in the last 168 hours.  BNP (last 3 results) No results for input(s): PROBNP in the last 8760 hours. HbA1C: No results for input(s): HGBA1C in the last 72 hours. CBG: No results for input(s): GLUCAP in the last 168 hours. Lipid Profile: No results for input(s): CHOL, HDL, LDLCALC, TRIG, CHOLHDL, LDLDIRECT in the last 72 hours. Thyroid Function Tests: No results for input(s): TSH, T4TOTAL, FREET4, T3FREE, THYROIDAB in the last 72 hours. Anemia Panel: No results for  input(s): VITAMINB12, FOLATE, FERRITIN, TIBC, IRON, RETICCTPCT in the last 72 hours. Sepsis Labs: No results for input(s): PROCALCITON, LATICACIDVEN in the last 168 hours.   No results found for this or any previous visit (from the past 240 hour(s)).        Radiology Studies: No results found.      Scheduled Meds:  divalproex  500 mg Oral BID   enoxaparin (LOVENOX) injection  40 mg Subcutaneous Q24H   feeding supplement  237 mL Oral BID BM   hydrocortisone cream  1 application Topical TID   senna-docusate  2 tablet Oral BID   ziprasidone  20 mg Oral BID  WC   Continuous Infusions:   LOS: 27 days    Time spent: 15 mins.More than 50% of that time was spent in counseling and/or coordination of care.      Gillis Santa, MD [facility] P9/16/2022, 2:48 PM

## 2021-07-01 MED ORDER — MELATONIN 5 MG PO TABS
5.0000 mg | ORAL_TABLET | Freq: Every day | ORAL | Status: DC
  Administered 2021-07-01 – 2021-08-06 (×37): 5 mg via ORAL
  Filled 2021-07-01 (×39): qty 1

## 2021-07-01 MED ORDER — TRAZODONE HCL 50 MG PO TABS
50.0000 mg | ORAL_TABLET | Freq: Every evening | ORAL | Status: DC | PRN
  Administered 2021-07-01 – 2021-07-14 (×11): 50 mg via ORAL
  Filled 2021-07-01 (×11): qty 1

## 2021-07-01 MED ORDER — POLYETHYLENE GLYCOL 3350 17 G PO PACK
17.0000 g | PACK | Freq: Every day | ORAL | Status: DC | PRN
  Administered 2021-07-02 – 2021-07-03 (×2): 17 g via ORAL
  Filled 2021-07-01 (×2): qty 1

## 2021-07-01 NOTE — Progress Notes (Signed)
PROGRESS NOTE    Sabrina Johnston  RXV:[id number] DOB: 08-12-1956 DOA: 06/01/2021 PCP: Lauro Regulus, MD   Chief Complain: Fall, agitation  Brief Narrative:  Patient is a 65 year old female with intellectual delay/disability, seizures, recent E. coli bacteremia who presented from group home with falls, agitation.  She was recently admitted here and was found to have E. coli UTI/bacteremia, treated with IV antibiotics and was discharged to group home.  Group home returned the patient here because she was too difficult to manage.  Since then she is waiting for placement.  TOC following.  Long length of stay.  We also consulted psychiatry for medication adjustment.  Assessment & Plan:   Principal Problem:   Severe intellectual disability Active Problems:   Generalized epilepsy (HCC)   Bacteremia due to Gram-negative bacteria   Abnormal TSH   Pressure ulcer with abrasion, blister, partial thickness skin loss involving epidermis and/or dermis (HCC)   Advanced cognitive impairment at baseline/intellectual/developmental delay: At baseline, she has limited cognitive and neurobehavioral function.  As per her caretaker, she was able to ambulate, dependent but cooperative for cares.  She is nonverbal at baseline.  After she was discharged from here after being treated for E. coli bacteremia, she has been resistant to cares, agitated, noncompliant.  Extensive work-up was done given her change in the behavior.  Neurology was also consulted.  Patient has declined cognitively from her baseline. No further work-up planned.   TOC is following for placement. Her behavioral status continues to decline and  she continues to be intermittently   agitated on current medication.  Psychiatry consulted.  Currently on IV Valium 2.5 mg as needed for agitation  History of epilepsy: No seizures since admission.  On Depakote  AKI: Resolved with IV fluids  Macrocytic anemia: Vitamin B12 normal.  TSH slightly  high but T3 and T4 normal.  Continue to monitor.  Hemoglobin stable  Rhabdomyolysis: Resolved  Constipation: Continue bowel regimen  Right index finger skin lesion: Improved .  No finding of cellulitis.  No indication for antibiotic therapy  Generalized macular rash: Unclear etiology.  She was seen scratching his skin.  Continue hydrocortisone as needed.Much improved now    Nutrition Problem: Inadequate oral intake Etiology:  (altered mental status)      DVT prophylaxis:Lovenox Code Status: Full Family Communication: None at the bedside Status is: Inpatient  Remains inpatient appropriate because:Unsafe d/c plan  Dispo: The patient is from: Group home              Anticipated d/c is to: Group home              Patient currently is medically stable to d/c.   Difficult to place patient Yes      Consultants: Neurology  Procedures:None  Antimicrobials:  Anti-infectives (From admission, onward)    Start     Dose/Rate Route Frequency Ordered Stop   06/04/21 1000  cephALEXin (KEFLEX) capsule 500 mg        500 mg Oral 4 times daily 06/04/21 0811 06/08/21 2329   06/02/21 1000  cefTRIAXone (ROCEPHIN) 2 g in sodium chloride 0.9 % 100 mL IVPB  Status:  Discontinued        2 g 200 mL/hr over 30 Minutes Intravenous Every 24 hours 06/02/21 0950 06/04/21 0810   06/02/21 0345  cefTRIAXone (ROCEPHIN) 1 g in sodium chloride 0.9 % 100 mL IVPB        1 g 200 mL/hr over 30 Minutes Intravenous  Once  06/02/21 0330 06/02/21 0452   06/01/21 2200  cefTRIAXone (ROCEPHIN) 1 g in sodium chloride 0.9 % 100 mL IVPB        1 g 200 mL/hr over 30 Minutes Intravenous  Once 06/01/21 2150 06/02/21 0039       Subjective: As per sitter night nurse noticed that patient was in discomfort, seems to be in pain, we will use Tylenol.  Patient did not sleep well last night, so started on melatonin scheduled and trazodone as needed RN was advised to monitor BM and urine output  Objective: Vitals:    06/30/21 2349 07/01/21 0609 07/01/21 0753 07/01/21 1232  BP: 116/85 (!) 130/53 (!) 139/107 128/70  Pulse: 86 83 92 91  Resp: 19 19 18 19   Temp: 97.8 F (36.6 C) 97.7 F (36.5 C) 98.1 F (36.7 C) 98.5 F (36.9 C)  TempSrc: Axillary Oral    SpO2: 95% 95% 100% 98%  Weight:      Height:        Intake/Output Summary (Last 24 hours) at 07/01/2021 1401 Last data filed at 07/01/2021 1034 Gross per 24 hour  Intake 420 ml  Output --  Net 420 ml    Filed Weights   06/01/21 1856 06/16/21 1507 06/29/21 1215  Weight: 61.2 kg 63.6 kg 60.6 kg    Examination:  General exam: Overall comfortable, moving on the bed/mildly agaitated, nonverbal  Data Reviewed: I have personally reviewed following labs and imaging studies  CBC: Recent Labs  Lab 06/29/21 0354  WBC 11.9*  HGB 12.4  HCT 38.0  MCV 114.1*  PLT 356    Basic Metabolic Panel: Recent Labs  Lab 06/28/21 0921 06/29/21 0354  NA  --  141  K  --  4.3  CL  --  108  CO2  --  25  GLUCOSE  --  96  BUN  --  11  CREATININE  --  0.76  CALCIUM  --  8.6*  MG 2.5* 2.4  PHOS 4.8* 4.7*    GFR: Estimated Creatinine Clearance: 58.5 mL/min (by C-G formula based on SCr of 0.76 mg/dL). Liver Function Tests: No results for input(s): AST, ALT, ALKPHOS, BILITOT, PROT, ALBUMIN in the last 168 hours.  No results for input(s): LIPASE, AMYLASE in the last 168 hours. Recent Labs  Lab 06/29/21 0354  AMMONIA 36*   Coagulation Profile: No results for input(s): INR, PROTIME in the last 168 hours. Cardiac Enzymes: No results for input(s): CKTOTAL, CKMB, CKMBINDEX, TROPONINI in the last 168 hours.  BNP (last 3 results) No results for input(s): PROBNP in the last 8760 hours. HbA1C: No results for input(s): HGBA1C in the last 72 hours. CBG: No results for input(s): GLUCAP in the last 168 hours. Lipid Profile: No results for input(s): CHOL, HDL, LDLCALC, TRIG, CHOLHDL, LDLDIRECT in the last 72 hours. Thyroid Function Tests: No results  for input(s): TSH, T4TOTAL, FREET4, T3FREE, THYROIDAB in the last 72 hours. Anemia Panel: No results for input(s): VITAMINB12, FOLATE, FERRITIN, TIBC, IRON, RETICCTPCT in the last 72 hours. Sepsis Labs: No results for input(s): PROCALCITON, LATICACIDVEN in the last 168 hours.   No results found for this or any previous visit (from the past 240 hour(s)).        Radiology Studies: No results found.      Scheduled Meds:  divalproex  500 mg Oral BID   enoxaparin (LOVENOX) injection  40 mg Subcutaneous Q24H   feeding supplement  237 mL Oral BID BM   hydrocortisone cream  1 application Topical TID   melatonin  5 mg Oral QHS   senna-docusate  2 tablet Oral BID   ziprasidone  20 mg Oral BID WC   Continuous Infusions:   LOS: 28 days    Time spent: 15 mins.More than 50% of that time was spent in counseling and/or coordination of care.      Gillis Santa, MD [facility] P9/17/2022, 2:01 PM

## 2021-07-02 NOTE — Progress Notes (Signed)
PROGRESS NOTE    Sabrina Johnston  RXV:[id number] DOB: 08-12-1956 DOA: 06/01/2021 PCP: Lauro Regulus, MD   Chief Complain: Fall, agitation  Brief Narrative:  Patient is a 65 year old female with intellectual delay/disability, seizures, recent E. coli bacteremia who presented from group home with falls, agitation.  She was recently admitted here and was found to have E. coli UTI/bacteremia, treated with IV antibiotics and was discharged to group home.  Group home returned the patient here because she was too difficult to manage.  Since then she is waiting for placement.  TOC following.  Long length of stay.  We also consulted psychiatry for medication adjustment.  Assessment & Plan:   Principal Problem:   Severe intellectual disability Active Problems:   Generalized epilepsy (HCC)   Bacteremia due to Gram-negative bacteria   Abnormal TSH   Pressure ulcer with abrasion, blister, partial thickness skin loss involving epidermis and/or dermis (HCC)   Advanced cognitive impairment at baseline/intellectual/developmental delay: At baseline, she has limited cognitive and neurobehavioral function.  As per her caretaker, she was able to ambulate, dependent but cooperative for cares.  She is nonverbal at baseline.  After she was discharged from here after being treated for E. coli bacteremia, she has been resistant to cares, agitated, noncompliant.  Extensive work-up was done given her change in the behavior.  Neurology was also consulted.  Patient has declined cognitively from her baseline. No further work-up planned.   TOC is following for placement. Her behavioral status continues to decline and  she continues to be intermittently   agitated on current medication.  Psychiatry consulted.  Currently on IV Valium 2.5 mg as needed for agitation  History of epilepsy: No seizures since admission.  On Depakote  AKI: Resolved with IV fluids  Macrocytic anemia: Vitamin B12 normal.  TSH slightly  high but T3 and T4 normal.  Continue to monitor.  Hemoglobin stable  Rhabdomyolysis: Resolved  Constipation: Continue bowel regimen  Right index finger skin lesion: Improved .  No finding of cellulitis.  No indication for antibiotic therapy  Generalized macular rash: Unclear etiology.  She was seen scratching his skin.  Continue hydrocortisone as needed.Much improved now    Nutrition Problem: Inadequate oral intake Etiology:  (altered mental status)      DVT prophylaxis:Lovenox Code Status: Full Family Communication: None at the bedside Status is: Inpatient  Remains inpatient appropriate because:Unsafe d/c plan  Dispo: The patient is from: Group home              Anticipated d/c is to: Group home              Patient currently is medically stable to d/c.   Difficult to place patient Yes      Consultants: Neurology  Procedures:None  Antimicrobials:  Anti-infectives (From admission, onward)    Start     Dose/Rate Route Frequency Ordered Stop   06/04/21 1000  cephALEXin (KEFLEX) capsule 500 mg        500 mg Oral 4 times daily 06/04/21 0811 06/08/21 2329   06/02/21 1000  cefTRIAXone (ROCEPHIN) 2 g in sodium chloride 0.9 % 100 mL IVPB  Status:  Discontinued        2 g 200 mL/hr over 30 Minutes Intravenous Every 24 hours 06/02/21 0950 06/04/21 0810   06/02/21 0345  cefTRIAXone (ROCEPHIN) 1 g in sodium chloride 0.9 % 100 mL IVPB        1 g 200 mL/hr over 30 Minutes Intravenous  Once  06/02/21 0330 06/02/21 0452   06/01/21 2200  cefTRIAXone (ROCEPHIN) 1 g in sodium chloride 0.9 % 100 mL IVPB        1 g 200 mL/hr over 30 Minutes Intravenous  Once 06/01/21 2150 06/02/21 0039       Subjective: No significant overnight events, patient resting comfortably.  Patient had some suspicion for sleep which she slept well after taking medication at nighttime as per sitter at bedside.  Also mentioned that patient is eating well. RN was advised to monitor BM and urine  output  Objective: Vitals:   07/01/21 1232 07/01/21 1649 07/01/21 1917 07/02/21 0917  BP: 128/70 105/79 137/83 132/77  Pulse: 91 88 98 85  Resp: 19 18 17 17   Temp: 98.5 F (36.9 C) 97.7 F (36.5 C) 97.6 F (36.4 C) 97.8 F (36.6 C)  TempSrc:    Axillary  SpO2: 98% 100% 96% 95%  Weight:      Height:        Intake/Output Summary (Last 24 hours) at 07/02/2021 1152 Last data filed at 07/01/2021 1300 Gross per 24 hour  Intake 0 ml  Output --  Net 0 ml    Filed Weights   06/01/21 1856 06/16/21 1507 06/29/21 1215  Weight: 61.2 kg 63.6 kg 60.6 kg    Examination:  General exam: Overall comfortable, moving on the bed/mildly agaitated, nonverbal  Data Reviewed: I have personally reviewed following labs and imaging studies  CBC: Recent Labs  Lab 06/29/21 0354  WBC 11.9*  HGB 12.4  HCT 38.0  MCV 114.1*  PLT 356    Basic Metabolic Panel: Recent Labs  Lab 06/28/21 0921 06/29/21 0354  NA  --  141  K  --  4.3  CL  --  108  CO2  --  25  GLUCOSE  --  96  BUN  --  11  CREATININE  --  0.76  CALCIUM  --  8.6*  MG 2.5* 2.4  PHOS 4.8* 4.7*    GFR: Estimated Creatinine Clearance: 58.5 mL/min (by C-G formula based on SCr of 0.76 mg/dL). Liver Function Tests: No results for input(s): AST, ALT, ALKPHOS, BILITOT, PROT, ALBUMIN in the last 168 hours.  No results for input(s): LIPASE, AMYLASE in the last 168 hours. Recent Labs  Lab 06/29/21 0354  AMMONIA 36*   Coagulation Profile: No results for input(s): INR, PROTIME in the last 168 hours. Cardiac Enzymes: No results for input(s): CKTOTAL, CKMB, CKMBINDEX, TROPONINI in the last 168 hours.  BNP (last 3 results) No results for input(s): PROBNP in the last 8760 hours. HbA1C: No results for input(s): HGBA1C in the last 72 hours. CBG: No results for input(s): GLUCAP in the last 168 hours. Lipid Profile: No results for input(s): CHOL, HDL, LDLCALC, TRIG, CHOLHDL, LDLDIRECT in the last 72 hours. Thyroid Function  Tests: No results for input(s): TSH, T4TOTAL, FREET4, T3FREE, THYROIDAB in the last 72 hours. Anemia Panel: No results for input(s): VITAMINB12, FOLATE, FERRITIN, TIBC, IRON, RETICCTPCT in the last 72 hours. Sepsis Labs: No results for input(s): PROCALCITON, LATICACIDVEN in the last 168 hours.   No results found for this or any previous visit (from the past 240 hour(s)).        Radiology Studies: No results found.      Scheduled Meds:  divalproex  500 mg Oral BID   enoxaparin (LOVENOX) injection  40 mg Subcutaneous Q24H   feeding supplement  237 mL Oral BID BM   hydrocortisone cream  1 application Topical  TID   melatonin  5 mg Oral QHS   senna-docusate  2 tablet Oral BID   ziprasidone  20 mg Oral BID WC   Continuous Infusions:   LOS: 29 days    Time spent: 15 mins.More than 50% of that time was spent in counseling and/or coordination of care.      Gillis Santa, MD [facility] P9/18/2022, 11:52 AM

## 2021-07-03 DIAGNOSIS — R451 Restlessness and agitation: Secondary | ICD-10-CM

## 2021-07-03 DIAGNOSIS — Z66 Do not resuscitate: Secondary | ICD-10-CM

## 2021-07-03 DIAGNOSIS — Z515 Encounter for palliative care: Secondary | ICD-10-CM

## 2021-07-03 DIAGNOSIS — Z7189 Other specified counseling: Secondary | ICD-10-CM

## 2021-07-03 DIAGNOSIS — W19XXXD Unspecified fall, subsequent encounter: Secondary | ICD-10-CM

## 2021-07-03 NOTE — Plan of Care (Signed)
Report received at bedside. Alert but not oriented with even and unlabored breathing. On a 1:1 sitter. Restless at times. Valium 0.5 ml administered. Bed positioned low. Continue poc / hourly rounding. Problem: Education: Goal: Knowledge of General Education information will improve Description: Including pain rating scale, medication(s)/side effects and non-pharmacologic comfort measures Outcome: Progressing   Problem: Health Behavior/Discharge Planning: Goal: Ability to manage health-related needs will improve Outcome: Progressing   Problem: Clinical Measurements: Goal: Ability to maintain clinical measurements within normal limits will improve Outcome: Progressing Goal: Will remain free from infection Outcome: Progressing Goal: Diagnostic test results will improve Outcome: Progressing Goal: Respiratory complications will improve Outcome: Progressing Goal: Cardiovascular complication will be avoided Outcome: Progressing   Problem: Activity: Goal: Risk for activity intolerance will decrease Outcome: Progressing   Problem: Nutrition: Goal: Adequate nutrition will be maintained Outcome: Progressing   Problem: Coping: Goal: Level of anxiety will decrease Outcome: Progressing   Problem: Elimination: Goal: Will not experience complications related to bowel motility Outcome: Progressing Goal: Will not experience complications related to urinary retention Outcome: Progressing   Problem: Pain Managment: Goal: General experience of comfort will improve Outcome: Progressing   Problem: Safety: Goal: Ability to remain free from injury will improve Outcome: Progressing   Problem: Skin Integrity: Goal: Risk for impaired skin integrity will decrease Outcome: Progressing

## 2021-07-03 NOTE — Progress Notes (Addendum)
PROGRESS NOTE    Sabrina Johnston  WCB:[id number] DOB: 1955/11/21 DOA: 06/01/2021 PCP: Lauro Regulus, MD    Brief Narrative:  Patient is a 65 year old female with past medical history of intellectual disability, seizures, recent E. coli bacteremia presented to hospital from group home with falls and agitation.  At the group home, patient was noted to be difficult to manage and currently awaiting for placement.  Medications have been adjusted by psychiatry.    Assessment & Plan:   Principal Problem:   Severe intellectual disability Active Problems:   Generalized epilepsy (HCC)   Bacteremia due to Gram-negative bacteria   Abnormal TSH   Pressure ulcer with abrasion, blister, partial thickness skin loss involving epidermis and/or dermis (HCC)  Advanced cognitive impairment at baseline/intellectual/developmental delay:  At baseline, patient with cognitive and neurobehavioral dysfunction.  Nonverbal at baseline.  Neurology and psychiatry on board.  No further plan at this time.  On Valium as needed for agitation.  Sitter at bedside.  Had episodes of agitation overnight but now more calm.  Continue melatonin, Geodon, trazodone  History of epilepsy: On Depakote.  No further seizures.  AKI: Resolved with IV fluids.  Macrocytic anemia: Vitamin B12 normal.  TSH slightly high but T3 and T4 normal.  No labs within the last 72 hours.  Rhabdomyolysis: Resolved  Constipation: Continue bowel regimen  Right index finger skin lesion: Improved .  Generalized macular rash: Improved.  On hydrocortisone cream.  DVT prophylaxis: Lovenox  Code Status:  Full code  Family Communication:  None  Status is: Inpatient  Remains inpatient appropriate because:Unsafe d/c plan  Dispo: The patient is from: Group home              Anticipated d/c is to: Group home              Patient currently is medically stable to d/c.   Difficult to place patient Yes   Consultants:   Neurology  Procedures: None  Antimicrobials:  None  Subjective: Today, patient was seen and examined at bedside.  Sitter at bedside stated that she was little agitated in the nighttime.  Sleepy at the time of my evaluation.  Nonverbal.    Objective: Vitals:   07/02/21 0917 07/02/21 1224 07/02/21 1719 07/02/21 1904  BP: 132/77 117/70 127/85 105/67  Pulse: 85 84 89 92  Resp: 17 19 18 15   Temp: 97.8 F (36.6 C) 97.7 F (36.5 C) (!) 97.4 F (36.3 C) 97.6 F (36.4 C)  TempSrc: Axillary Axillary Oral   SpO2: 95% 96% 100% 98%  Weight:      Height:       No intake or output data in the 24 hours ending 07/03/21 0917   Filed Weights   06/01/21 1856 06/16/21 1507 06/29/21 1215  Weight: 61.2 kg 63.6 kg 60.6 kg    Physical examination: General:  Average built, not in obvious distress, nonverbal, sleepy at the time of my exam HENT:   No scleral pallor or icterus noted. Oral mucosa is moist.  Chest:   Diminished breath sounds bilaterally. No crackles or wheezes.  CVS: S1 &S2 heard. No murmur.  Regular rate and rhythm. Abdomen: Soft, nontender, nondistended.  Bowel sounds are heard.   Extremities: No cyanosis, clubbing or edema.  Peripheral pulses are palpable. Psych: Nonverbal, CNS: Nonverbal and somnolent at the time of my exam. Skin: Warm and dry.  No rashes noted.   Data Reviewed: I have personally reviewed following labs and imaging studies  CBC:  Recent Labs  Lab 06/29/21 0354  WBC 11.9*  HGB 12.4  HCT 38.0  MCV 114.1*  PLT 356     Basic Metabolic Panel: Recent Labs  Lab 06/28/21 0921 06/29/21 0354  NA  --  141  K  --  4.3  CL  --  108  CO2  --  25  GLUCOSE  --  96  BUN  --  11  CREATININE  --  0.76  CALCIUM  --  8.6*  MG 2.5* 2.4  PHOS 4.8* 4.7*     GFR: Estimated Creatinine Clearance: 64 mL/min (by C-G formula based on SCr of 0.76 mg/dL). Liver Function Tests: No results for input(s): AST, ALT, ALKPHOS, BILITOT, PROT, ALBUMIN in the last  168 hours.  No results for input(s): LIPASE, AMYLASE in the last 168 hours. Recent Labs  Lab 06/29/21 0354  AMMONIA 36*    Coagulation Profile: No results for input(s): INR, PROTIME in the last 168 hours. Cardiac Enzymes: No results for input(s): CKTOTAL, CKMB, CKMBINDEX, TROPONINI in the last 168 hours.  BNP (last 3 results) No results for input(s): PROBNP in the last 8760 hours. HbA1C: No results for input(s): HGBA1C in the last 72 hours. CBG: No results for input(s): GLUCAP in the last 168 hours. Lipid Profile: No results for input(s): CHOL, HDL, LDLCALC, TRIG, CHOLHDL, LDLDIRECT in the last 72 hours. Thyroid Function Tests: No results for input(s): TSH, T4TOTAL, FREET4, T3FREE, THYROIDAB in the last 72 hours. Anemia Panel: No results for input(s): VITAMINB12, FOLATE, FERRITIN, TIBC, IRON, RETICCTPCT in the last 72 hours. Sepsis Labs: No results for input(s): PROCALCITON, LATICACIDVEN in the last 168 hours.   No results found for this or any previous visit (from the past 240 hour(s)).    Radiology Studies: No results found.    Scheduled Meds:  divalproex  500 mg Oral BID   enoxaparin (LOVENOX) injection  40 mg Subcutaneous Q24H   feeding supplement  237 mL Oral BID BM   hydrocortisone cream  1 application Topical TID   melatonin  5 mg Oral QHS   senna-docusate  2 tablet Oral BID   ziprasidone  20 mg Oral BID WC   Continuous Infusions:   LOS: 30 days   Joycelyn Das, MD [facility] P9/19/2022, 9:17 AM

## 2021-07-03 NOTE — Consult Note (Signed)
Consultation Note Date: 07/03/2021   Patient Name: Sabrina Johnston  DOB: 10/11/1956  MRN: [id number]  Age / Sex: 65 y.o., female  PCP: Kirk Ruths, MD Referring Physician: Flora Lipps, MD  Reason for Consultation: Establishing goals of care  HPI/Patient Profile: 65 y.o. female  with past medical history of intellectual disability, remote R stroke, seizures, recent E. Coli bacteremia admitted on 06/01/2021 with falls and agitation.   Clinical Assessment and Goals of Care: I met today at [facility] bedside and no family present. Sabrina Johnston is restful and lying in bed. She barely opens her eyes to look at me but does not speak or interact with me in any way. She does rub her head and NT at bedside notes that nighttime NT expressed concern for pain last night that kept patient up most of the night but she has been sleeping well so far this morning. NT expressed concern for headache or pain in head as patient kept rubbing her head and placing NT hand on her head. Priscille appears comfortable and peaceful at time of my visit. She has not eaten anything today but notes reflect she has been eating well past few days. NT assists with feeding.   I called and spoke with sister and legal guardian, Kathi Ludwig. Lattie Haw lives in [place] and has not seen Sabrina Johnston in person recently but their other sister visits and assists with FaceTime so she is able to see how Blen is doing. Lattie Haw shares that Roshanna was able to walk and feed herself prior to admission. Lattie Haw is upset about the abrupt change in living situation and how this was handled. Lattie Haw is hopeful that Anzlee can return to walking and feeding herself. I explained to Lattie Haw that there is the possibility that Caitlin will not return to this functional level. I expressed concern that change in environment, acute illness, and prolonged hospitalization could lead to further decline instead of  improvement. Lattie Haw is still hopeful but also understands. We discussed some of the "what if" scenarios and Lattie Haw makes decision for DNR status. Lattie Haw also in conversation agrees that Lakethia would not benefit from feeding tube or aggressive/invasive treatments. Lattie Haw shares "Sabrina Johnston has been through so much already." Lattie Haw would like to treat potentially reversible conditions with conservative interventions to optimize Chezney's functionality and quality of life.   Of note, Lattie Haw shares that she removed her sister from [facility] after their mother died. Airel lived at [facility] from ~9-10 yo until ~65 yo but Lattie Haw did not like the facility. Lattie Haw shares that she does not want Cereniti going back there. I made TOC aware of conversation and wishes.   All questions/concerns addressed. Emotional support provided.   Primary Decision Maker LEGAL GUARDIAN sister Kathi Ludwig    SUMMARY OF RECOMMENDATIONS   - DNR decided - Avoid aggressive/invasive interventions - Continue conservative treatment  Code Status/Advance Care Planning: DNR   Symptom Management:  Per attending and psych.   Palliative Prophylaxis:  Aspiration, Bowel Regimen, Delirium Protocol, Frequent Pain Assessment, Oral Care,  and Turn Reposition  Prognosis:  Difficult to determine but overall poor with worsening functional status that may lead to further health complications.   Discharge Planning: To Be Determined      Primary Diagnoses: Present on Admission:  (Resolved) Agitation  (Resolved) Acute metabolic encephalopathy  Generalized epilepsy ([place])  Bacteremia due to Gram-negative bacteria  Severe intellectual disability   I have reviewed the medical record, interviewed the patient and family, and examined the patient. The following aspects are pertinent.  Past Medical History:  Diagnosis Date   Menopause    Mental retardation    PMB (postmenopausal bleeding)    Social History   Socioeconomic History   Marital  status: Single    Spouse name: Not on file   Number of children: Not on file   Years of education: Not on file   Highest education level: Not on file  Occupational History   Not on file  Tobacco Use   Smoking status: Never   Smokeless tobacco: Never  Vaping Use   Vaping Use: Never used  Substance and Sexual Activity   Alcohol use: No   Drug use: No   Sexual activity: Never  Other Topics Concern   Not on file  Social History Narrative   Not on file   Social Determinants of Health   Financial Resource Strain: Not on file  Food Insecurity: Not on file  Transportation Needs: Not on file  Physical Activity: Not on file  Stress: Not on file  Social Connections: Not on file   Family History  Family history unknown: Yes   Scheduled Meds:  divalproex  500 mg Oral BID   enoxaparin (LOVENOX) injection  40 mg Subcutaneous Q24H   feeding supplement  237 mL Oral BID BM   hydrocortisone cream  1 application Topical TID   melatonin  5 mg Oral QHS   senna-docusate  2 tablet Oral BID   ziprasidone  20 mg Oral BID WC   Continuous Infusions: PRN Meds:.acetaminophen **OR** acetaminophen, diazepam, hydrALAZINE, ondansetron **OR** ondansetron (ZOFRAN) IV, polyethylene glycol, traZODone, ziprasidone No Known Allergies Review of Systems  Unable to perform ROS: Patient nonverbal   Physical Exam Vitals and nursing note reviewed.  Constitutional:      Appearance: She is ill-appearing.     Comments: Sleepy  Cardiovascular:     Rate and Rhythm: Normal rate.  Pulmonary:     Effort: No tachypnea, accessory muscle usage or respiratory distress.  Abdominal:     Palpations: Abdomen is soft.  Neurological:     Comments: Nonverbal at baseline. She does not follow commands.     Vital Signs: BP 105/67   Pulse 92   Temp 97.6 F (36.4 C)   Resp 15   Ht 5' (1.524 m)   Wt 60.6 kg   SpO2 98%   BMI 26.09 kg/m  Pain Scale: PAINAD   Pain Score: 0-No pain   SpO2: SpO2: 98 % O2  Device:SpO2: 98 % O2 Flow Rate: .   IO: Intake/output summary: No intake or output data in the 24 hours ending 07/03/21 1017  LBM: Last BM Date: 06/29/21 Baseline Weight: Weight: 61.2 kg Most recent weight: Weight: 60.6 kg     Palliative Assessment/Data:     Time In: 1100 Time Out: 1150 Time Total: 50 min Greater than 50%  of this time was spent counseling and coordinating care related to the above assessment and plan.  Signed by: Vinie Sill, NP Palliative Medicine Team Pager # [phone] (  M-F 8a-5p) Team Phone # [phone] (Nights/Weekends)

## 2021-07-04 LAB — BASIC METABOLIC PANEL
Anion gap: 8 (ref 5–15)
BUN: 23 mg/dL (ref 8–23)
CO2: 25 mmol/L (ref 22–32)
Calcium: 8.8 mg/dL — ABNORMAL LOW (ref 8.9–10.3)
Chloride: 107 mmol/L (ref 98–111)
Creatinine, Ser: 0.66 mg/dL (ref 0.44–1.00)
GFR, Estimated: >60 mL/min (ref >60)
Glucose, Bld: 86 mg/dL (ref 70–99)
Potassium: 4 mmol/L (ref 3.5–5.1)
Sodium: 140 mmol/L (ref 135–145)

## 2021-07-04 LAB — CBC
HCT: 39.2 % (ref 36.0–46.0)
Hemoglobin: 12.6 g/dL (ref 12.0–15.0)
MCH: 36.7 pg — ABNORMAL HIGH (ref 26.0–34.0)
MCHC: 32.1 g/dL (ref 30.0–36.0)
MCV: 114.3 fL — ABNORMAL HIGH (ref 80.0–100.0)
Platelets: 340 10*3/uL (ref 150–400)
RBC: 3.43 MIL/uL — ABNORMAL LOW (ref 3.87–5.11)
RDW: 15.6 % — ABNORMAL HIGH (ref 11.5–15.5)
WBC: 6.2 10*3/uL (ref 4.0–10.5)
nRBC: 0 % (ref 0.0–0.2)

## 2021-07-04 MED ORDER — POLYETHYLENE GLYCOL 3350 17 G PO PACK
17.0000 g | PACK | Freq: Two times a day (BID) | ORAL | Status: DC
  Administered 2021-07-04 – 2021-07-31 (×50): 17 g via ORAL
  Filled 2021-07-04 (×51): qty 1

## 2021-07-04 MED ORDER — BISACODYL 10 MG RE SUPP
10.0000 mg | Freq: Every day | RECTAL | Status: DC | PRN
  Administered 2021-07-27 – 2021-08-05 (×2): 10 mg via RECTAL
  Filled 2021-07-04 (×2): qty 1

## 2021-07-04 MED ORDER — BISACODYL 10 MG RE SUPP
10.0000 mg | Freq: Once | RECTAL | Status: AC
  Administered 2021-07-04: 10 mg via RECTAL
  Filled 2021-07-04: qty 1

## 2021-07-04 NOTE — Progress Notes (Signed)
Nutrition Follow-up  DOCUMENTATION CODES:  Not applicable  INTERVENTION:  Continue current diet order per SLP.  Consider starting bowel regimen.  Continue Ensure Enlive BID.  Continue for nursing staff to assist with meals as needed.  NUTRITION DIAGNOSIS:  Inadequate oral intake related to  (altered mental status) as evidenced by  (routinely eating <75% of meals).  GOAL:  Patient will meet greater than or equal to 90% of their needs  MONITOR:  PO intake, Supplement acceptance  REASON FOR ASSESSMENT:  Consult Calorie Count  ASSESSMENT:  65 y.o. female presented to the ED with AMS and falls after being discharged back to her group home 4 hours prior to return. PMH of mental retardation, epilepsy, and chronic constipation. Pt nonverbal at baseline. Previous admission for sepsis due to UTI.  Pt currently medically stable. Awaiting discharge. Pt difficult placement due to agitation and mentation.   Pt's intake varied - average of 67% intake over the past 8 meals, ranging between 50-100%. Pt taking Ensure BID when offered.  Of note, pt's last BM was 9/15. RD to message MD regarding starting a bowel regimen via secure chat.  Admit wt: 61.2 kg Current wt: 60.6 kg Pt's weight remaining stable throughout admission.  Continue current nutrition plan.  Supplements: Ensure BID  Medications: reviewed; Depakote, Senokot BID, Geodon BID  Labs: reviewed  Diet Order:   Diet Order             DIET DYS 3 Room service appropriate? Yes; Fluid consistency: Thin  Diet effective now                  EDUCATION NEEDS:  No education needs have been identified at this time  Skin:  Skin Assessment: Reviewed RN Assessment  Last BM:  9/15 - type 4  Height:  Ht Readings from Last 1 Encounters:  06/01/21 5' (1.524 m)   Weight:  Wt Readings from Last 1 Encounters:  06/29/21 60.6 kg   Ideal Body Weight:  45.5 kg  BMI:  Body mass index is 26.09 kg/m.  Estimated Nutritional  Needs:  Kcal:  1400-1600 kcal/d Protein:  70-80 g/d Fluid:  1.5-1.8 L/d  Vertell Limber, RD, LDN (she/her/hers) Registered Dietitian I After-Hours/Weekend Pager # in [place]

## 2021-07-04 NOTE — Progress Notes (Signed)
PROGRESS NOTE    Sabrina Johnston  QMG:[id number] DOB: Aug 24, 1956 DOA: 06/01/2021 PCP: Lauro Regulus, MD    Brief Narrative:  Patient is a 65 year old female with past medical history of intellectual disability, seizures, recent E. coli bacteremia presented to hospital from group home with falls and agitation.  At the group home, patient was noted to be difficult to manage and currently awaiting for placement.  Medications have been adjusted by psychiatry.    Assessment & Plan:   Principal Problem:   Severe intellectual disability Active Problems:   Generalized epilepsy (HCC)   Bacteremia due to Gram-negative bacteria   Abnormal TSH   Pressure ulcer with abrasion, blister, partial thickness skin loss involving epidermis and/or dermis (HCC)  Advanced cognitive impairment at baseline/intellectual/developmental delay:  At baseline, patient with cognitive and neurobehavioral dysfunction.  Nonverbal at baseline.  Neurology and psychiatry saw the patient during hospitalization..  On Valium IV as needed for agitation.  Sitter at bedside.  Continue melatonin, Geodon, trazodone  History of epilepsy: On Depakote.  No further seizures.  AKI: Resolved with IV fluids.  Macrocytic anemia: Vitamin B12 normal.  TSH slightly high but T3 and T4 normal.  No labs within the last 72 hours.  Rhabdomyolysis: Resolved  Constipation: Continue bowel regimen  Right index finger skin lesion: Improved .  Generalized macular rash: Improved.  On hydrocortisone cream.  DVT prophylaxis: Lovenox  Code Status:  Full code  Family Communication:  None  Status is: Inpatient  Remains inpatient appropriate because:Unsafe d/c plan  Dispo: The patient is from: Group home              Anticipated d/c is to: Group home              Patient currently is medically stable to d/c.   Difficult to place patient Yes   Consultants:  Neurology  Procedures: None  Antimicrobials:   None  Subjective: Today, patient was seen and examined at bedside.  Appears to be calm this morning.  Patient has constipation as per the sitter at bedside.  Nonverbal.  Objective: Vitals:   07/03/21 1947 07/03/21 2330 07/04/21 0639 07/04/21 0810  BP: 124/68 130/64 (!) 121/48 122/67  Pulse: 84 87 79 77  Resp: 16 18 18 18   Temp: (!) 96.5 F (35.8 C) (!) 96.8 F (36 C) 98 F (36.7 C) 98.4 F (36.9 C)  TempSrc:      SpO2: 100% 98% 99% 99%  Weight:      Height:        Intake/Output Summary (Last 24 hours) at 07/04/2021 1127 Last data filed at 07/04/2021 1026 Gross per 24 hour  Intake 240 ml  Output --  Net 240 ml     Filed Weights   06/01/21 1856 06/16/21 1507 06/29/21 1215  Weight: 61.2 kg 63.6 kg 60.6 kg    Physical examination: General:  Average built, not in obvious distress, nonverbal, alert awake at this time.   HENT:   No scleral pallor or icterus noted. Oral mucosa is moist.  Chest:   Diminished breath sounds bilaterally. No crackles or wheezes.  CVS: S1 &S2 heard. No murmur.  Regular rate and rhythm. Abdomen: Soft, nontender, nondistended.  Bowel sounds are heard.   Extremities: No cyanosis, clubbing or edema.  Peripheral pulses are palpable. Psych: Nonverbal, CNS: Nonverbal but more alert awake. Skin: Warm and dry.  No rashes noted.   Data Reviewed: I have personally reviewed following labs and imaging studies  CBC: Recent  Labs  Lab 06/29/21 0354 07/04/21 0434  WBC 11.9* 6.2  HGB 12.4 12.6  HCT 38.0 39.2  MCV 114.1* 114.3*  PLT 356 340     Basic Metabolic Panel: Recent Labs  Lab 06/28/21 0921 06/29/21 0354 07/04/21 0434  NA  --  141 140  K  --  4.3 4.0  CL  --  108 107  CO2  --  25 25  GLUCOSE  --  96 86  BUN  --  11 23  CREATININE  --  0.76 0.66  CALCIUM  --  8.6* 8.8*  MG 2.5* 2.4  --   PHOS 4.8* 4.7*  --      GFR: Estimated Creatinine Clearance: 58.5 mL/min (by C-G formula based on SCr of 0.66 mg/dL). Liver Function  Tests: No results for input(s): AST, ALT, ALKPHOS, BILITOT, PROT, ALBUMIN in the last 168 hours.  No results for input(s): LIPASE, AMYLASE in the last 168 hours. Recent Labs  Lab 06/29/21 0354  AMMONIA 36*    Coagulation Profile: No results for input(s): INR, PROTIME in the last 168 hours. Cardiac Enzymes: No results for input(s): CKTOTAL, CKMB, CKMBINDEX, TROPONINI in the last 168 hours.  BNP (last 3 results) No results for input(s): PROBNP in the last 8760 hours. HbA1C: No results for input(s): HGBA1C in the last 72 hours. CBG: No results for input(s): GLUCAP in the last 168 hours. Lipid Profile: No results for input(s): CHOL, HDL, LDLCALC, TRIG, CHOLHDL, LDLDIRECT in the last 72 hours. Thyroid Function Tests: No results for input(s): TSH, T4TOTAL, FREET4, T3FREE, THYROIDAB in the last 72 hours. Anemia Panel: No results for input(s): VITAMINB12, FOLATE, FERRITIN, TIBC, IRON, RETICCTPCT in the last 72 hours. Sepsis Labs: No results for input(s): PROCALCITON, LATICACIDVEN in the last 168 hours.   No results found for this or any previous visit (from the past 240 hour(s)).    Radiology Studies: No results found.    Scheduled Meds:  divalproex  500 mg Oral BID   enoxaparin (LOVENOX) injection  40 mg Subcutaneous Q24H   feeding supplement  237 mL Oral BID BM   hydrocortisone cream  1 application Topical TID   melatonin  5 mg Oral QHS   senna-docusate  2 tablet Oral BID   ziprasidone  20 mg Oral BID WC   Continuous Infusions:   LOS: 31 days   Joycelyn Das, MD [facility] P9/20/2022, 11:27 AM

## 2021-07-04 NOTE — TOC Progression Note (Signed)
Transition of Care [facility]) - Progression Note    Patient Details  Name: Sabrina Johnston MRN: [id number] Date of Birth: March 18, 1956  Transition of Care [facility]) CM/SW Contact  Allayne Butcher, RN Phone Number: 07/04/2021, 4:10 PM    [facility] worker [name] is helping to find respite placement for patient.    Expected Discharge Plan: Group Home Barriers to Discharge: Requiring sitter/restraints, Unsafe home situation  Expected Discharge Plan and Services Expected Discharge Plan: Group Home   Discharge Planning Services: CM Consult   Living arrangements for the past 2 months: Group Home                 DME Arranged: N/A DME Agency: NA       HH Arranged: NA           Social Determinants of Health (SDOH) Interventions    Readmission Risk Interventions Readmission Risk Prevention Plan 06/06/2021  Post Dischage Appt Complete  Medication Screening Complete  Transportation Screening Complete  Some recent data might be hidden

## 2021-07-05 MED ORDER — PANTOPRAZOLE SODIUM 40 MG PO TBEC
40.0000 mg | DELAYED_RELEASE_TABLET | Freq: Every day | ORAL | Status: DC
  Administered 2021-07-05 – 2021-08-07 (×34): 40 mg via ORAL
  Filled 2021-07-05 (×34): qty 1

## 2021-07-05 MED ORDER — IBUPROFEN 100 MG/5ML PO SUSP
600.0000 mg | Freq: Four times a day (QID) | ORAL | Status: DC | PRN
  Administered 2021-07-05 – 2021-07-19 (×3): 600 mg via ORAL
  Filled 2021-07-05 (×8): qty 30

## 2021-07-05 NOTE — Progress Notes (Signed)
Pt not awake enough this AM to given AM PO medications, Geodon and Depakote. MD made aware and Depakote and afternoon Geodon given once patient was awake enough to safely swallow medications.

## 2021-07-05 NOTE — Progress Notes (Signed)
PROGRESS NOTE    DEREON WILLIAMSEN  CNO:[id number] DOB: 1956/07/06 DOA: 06/01/2021 PCP: Lauro Regulus, MD    Brief Narrative: Patient is a 65 year old female with past medical history of intellectual disability, seizures, recent E. coli bacteremia presented to hospital from group home with falls and agitation.  At the group home, patient was noted to be difficult to manage and currently awaiting for placement.  Medications have been adjusted by psychiatry.    Assessment & Plan:   Principal Problem:   Severe intellectual disability Active Problems:   Generalized epilepsy (HCC)   Bacteremia due to Gram-negative bacteria   Abnormal TSH   Pressure ulcer with abrasion, blister, partial thickness skin loss involving epidermis and/or dermis (HCC)  Advanced cognitive impairment at baseline/intellectual/developmental delay:  At baseline, patient with cognitive and neurobehavioral dysfunction.  Nonverbal at baseline.  Neurology and psychiatry saw the patient during hospitalization..  On Valium IV as needed for agitation.  Sitter at bedside.  Continue melatonin, Geodon, trazodone.  History of epilepsy: On Depakote.  No further seizures.  AKI: Resolved with IV fluids.  Macrocytic anemia: Vitamin B12 normal.  TSH slightly high but T3 and T4 normal.  No labs within the last 72 hours.  Rhabdomyolysis: Resolved  Constipation: Continue bowel regimen.  Had bowel movement yesterday.  Right index finger skin lesion: Improved .  Generalized macular rash: Improved.  On hydrocortisone cream.  DVT prophylaxis: Lovenox  Code Status:  Full code  Family Communication:  None  Status is: Inpatient  Remains inpatient appropriate because:Unsafe d/c plan  Dispo: The patient is from: Group home              Anticipated d/c is to: Group home              Patient currently is medically stable to d/c.   Difficult to place patient Yes   Consultants:   Neurology  Procedures: None  Antimicrobials:  None  Subjective: Today, patient was seen and examined at bedside.  Sitter at bedside stated that the patient had a bowel movement.  Objective: Vitals:   07/04/21 1607 07/04/21 2103 07/05/21 0434 07/05/21 0823  BP: (!) 112/58 (!) 136/93 126/81 109/61  Pulse: 76 93 78 65  Resp: 14 18 16    Temp: (!) 97.3 F (36.3 C) 97.6 F (36.4 C) 97.6 F (36.4 C)   TempSrc:      SpO2: 100% 98% 99% 99%  Weight:      Height:        Intake/Output Summary (Last 24 hours) at 07/05/2021 0957 Last data filed at 07/05/2021 0906 Gross per 24 hour  Intake 360 ml  Output 2 ml  Net 358 ml     Filed Weights   06/01/21 1856 06/16/21 1507 06/29/21 1215  Weight: 61.2 kg 63.6 kg 60.6 kg    Physical examination: General:  Average built, not in obvious distress, nonverbal, alert awake.  HENT:   No scleral pallor or icterus noted. Oral mucosa is moist.  Chest:   Diminished breath sounds bilaterally. No crackles or wheezes.  CVS: S1 &S2 heard. No murmur.  Regular rate and rhythm. Abdomen: Soft, nontender, nondistended.  Bowel sounds are heard.   Extremities: No cyanosis, clubbing or edema.  Peripheral pulses are palpable. Psych: Nonverbal, CNS: Nonverbal but more alert awake. Skin: Warm and dry.  No rashes noted.   Data Reviewed: I have personally reviewed following labs and imaging studies  CBC: Recent Labs  Lab 06/29/21 0354 07/04/21 0434  WBC  11.9* 6.2  HGB 12.4 12.6  HCT 38.0 39.2  MCV 114.1* 114.3*  PLT 356 340     Basic Metabolic Panel: Recent Labs  Lab 06/29/21 0354 07/04/21 0434  NA 141 140  K 4.3 4.0  CL 108 107  CO2 25 25  GLUCOSE 96 86  BUN 11 23  CREATININE 0.76 0.66  CALCIUM 8.6* 8.8*  MG 2.4  --   PHOS 4.7*  --      GFR: Estimated Creatinine Clearance: 58.5 mL/min (by C-G formula based on SCr of 0.66 mg/dL). Liver Function Tests: No results for input(s): AST, ALT, ALKPHOS, BILITOT, PROT, ALBUMIN in the last  168 hours.  No results for input(s): LIPASE, AMYLASE in the last 168 hours. Recent Labs  Lab 06/29/21 0354  AMMONIA 36*    Coagulation Profile: No results for input(s): INR, PROTIME in the last 168 hours. Cardiac Enzymes: No results for input(s): CKTOTAL, CKMB, CKMBINDEX, TROPONINI in the last 168 hours.  BNP (last 3 results) No results for input(s): PROBNP in the last 8760 hours. HbA1C: No results for input(s): HGBA1C in the last 72 hours. CBG: No results for input(s): GLUCAP in the last 168 hours. Lipid Profile: No results for input(s): CHOL, HDL, LDLCALC, TRIG, CHOLHDL, LDLDIRECT in the last 72 hours. Thyroid Function Tests: No results for input(s): TSH, T4TOTAL, FREET4, T3FREE, THYROIDAB in the last 72 hours. Anemia Panel: No results for input(s): VITAMINB12, FOLATE, FERRITIN, TIBC, IRON, RETICCTPCT in the last 72 hours. Sepsis Labs: No results for input(s): PROCALCITON, LATICACIDVEN in the last 168 hours.   No results found for this or any previous visit (from the past 240 hour(s)).    Radiology Studies: No results found.    Scheduled Meds:  divalproex  500 mg Oral BID   enoxaparin (LOVENOX) injection  40 mg Subcutaneous Q24H   feeding supplement  237 mL Oral BID BM   hydrocortisone cream  1 application Topical TID   melatonin  5 mg Oral QHS   polyethylene glycol  17 g Oral BID   senna-docusate  2 tablet Oral BID   ziprasidone  20 mg Oral BID WC   Continuous Infusions:   LOS: 32 days   Joycelyn Das, MD [facility] P9/21/2022, 9:57 AM

## 2021-07-05 NOTE — TOC Progression Note (Signed)
Transition of Care [facility]) - Progression Note    Patient Details  Name: ROOSEVELT BISHER MRN: [id number] Date of Birth: 1956/06/10  Transition of Care [facility]) CM/SW Contact  Allayne Butcher, RN Phone Number: 07/05/2021, 3:53 PM  Clinical Narrative:    RNCM had a meeting with [facility] Case worker and nurse this morning to discuss placement options.  [name] is working on emergency respite care placement to get patient out of the hospital.  Medically patient is cleared, she has no on going special medical needs.     Expected Discharge Plan: Group Home Barriers to Discharge: Requiring sitter/restraints, Unsafe home situation  Expected Discharge Plan and Services Expected Discharge Plan: Group Home   Discharge Planning Services: CM Consult   Living arrangements for the past 2 months: Group Home                 DME Arranged: N/A DME Agency: NA       HH Arranged: NA           Social Determinants of Health (SDOH) Interventions    Readmission Risk Interventions Readmission Risk Prevention Plan 06/06/2021  Post Dischage Appt Complete  Medication Screening Complete  Transportation Screening Complete  Some recent data might be hidden

## 2021-07-05 NOTE — Plan of Care (Signed)
Report received at bedside. Alert but not oriented with even and unlabored breathing. On a 1:1 sitter. Restless at times. Valium 0.5 ml administered. Bed positioned low. Continue poc / hourly rounding. Problem: Education: Goal: Knowledge of General Education information will improve Description: Including pain rating scale, medication(s)/side effects and non-pharmacologic comfort measures Outcome: Progressing   Problem: Health Behavior/Discharge Planning: Goal: Ability to manage health-related needs will improve Outcome: Progressing   Problem: Clinical Measurements: Goal: Ability to maintain clinical measurements within normal limits will improve Outcome: Progressing Goal: Will remain free from infection Outcome: Progressing Goal: Diagnostic test results will improve Outcome: Progressing Goal: Respiratory complications will improve Outcome: Progressing Goal: Cardiovascular complication will be avoided Outcome: Progressing   Problem: Activity: Goal: Risk for activity intolerance will decrease Outcome: Progressing   Problem: Nutrition: Goal: Adequate nutrition will be maintained Outcome: Progressing   Problem: Coping: Goal: Level of anxiety will decrease Outcome: Progressing   Problem: Elimination: Goal: Will not experience complications related to bowel motility Outcome: Progressing Goal: Will not experience complications related to urinary retention Outcome: Progressing   Problem: Pain Managment: Goal: General experience of comfort will improve Outcome: Progressing   Problem: Safety: Goal: Ability to remain free from injury will improve Outcome: Progressing   Problem: Skin Integrity: Goal: Risk for impaired skin integrity will decrease Outcome: Progressing   

## 2021-07-06 NOTE — Plan of Care (Signed)
Patient alert but not oriented. Patient seemed to be restless not sure if it was pain related or anxiety. Patient given Tylenol at 2008 no relief noted. Valium was given at 2107 which seemed to help relax patient. 1:1 sitter in place, vitals stable. Will continue to round as necessary.  Problem: Elimination: Goal: Will not experience complications related to bowel motility Outcome: Progressing Goal: Will not experience complications related to urinary retention Outcome: Progressing   Problem: Nutrition: Goal: Adequate nutrition will be maintained Outcome: Progressing   Problem: Pain Managment: Goal: General experience of comfort will improve Outcome: Progressing   Problem: Skin Integrity: Goal: Risk for impaired skin integrity will decrease Outcome: Progressing

## 2021-07-06 NOTE — Plan of Care (Signed)
Patient alert but not oriented, which is patients baseline. Patient has been very restless. Valium administered at 2139, some relief noted. Patients vitals signs are stable. 1:1 sitter at bedside. Will continue to monitor. Problem: Clinical Measurements: Goal: Ability to maintain clinical measurements within normal limits will improve Outcome: Progressing Goal: Will remain free from infection Outcome: Progressing Goal: Diagnostic test results will improve Outcome: Progressing Goal: Respiratory complications will improve Outcome: Progressing Goal: Cardiovascular complication will be avoided Outcome: Progressing   Problem: Elimination: Goal: Will not experience complications related to bowel motility Outcome: Progressing Goal: Will not experience complications related to urinary retention Outcome: Progressing   Problem: Skin Integrity: Goal: Risk for impaired skin integrity will decrease Outcome: Progressing

## 2021-07-06 NOTE — Progress Notes (Signed)
PROGRESS NOTE    Sabrina Johnston  PZW:[id number] DOB: 13-May-1956 DOA: 06/01/2021 PCP: Lauro Regulus, MD    Brief Narrative: Patient is a 65 year old female with past medical history of intellectual disability, seizures, recent E. coli bacteremia presented to hospital from group home with falls and agitation.  At the group home, patient was noted to be difficult to manage and currently awaiting for placement.  Medications have been adjusted by psychiatry.    Assessment & Plan:   Principal Problem:   Severe intellectual disability Active Problems:   Generalized epilepsy (HCC)   Bacteremia due to Gram-negative bacteria   Abnormal TSH   Pressure ulcer with abrasion, blister, partial thickness skin loss involving epidermis and/or dermis (HCC)  Advanced cognitive impairment at baseline/intellectual/developmental delay:  At baseline, patient with cognitive and neurobehavioral dysfunction.  Nonverbal at baseline.  Neurology and psychiatry saw the patient during hospitalization..  On Valium IV as needed for agitation.  Sitter at bedside.  Continue melatonin, Geodon, trazodone.  Patient does have episodes of agitation in the hospital  History of epilepsy: On Depakote.  No further seizures.  AKI: Resolved with IV fluids.  Latest creatinine of 0.6  Macrocytic anemia: Vitamin B12 normal.  TSH slightly high but T3 and T4 normal.  No labs within the last 72 hours.  Rhabdomyolysis: Resolved  Constipation: Continue bowel regimen.  Had bowel movement yesterday.  Right index finger skin lesion: Improved .  Generalized macular rash: Improved.  On hydrocortisone cream.  DVT prophylaxis: Lovenox  Code Status:  Full code  Family Communication:  None  Status is: Inpatient  Remains inpatient appropriate because:Unsafe d/c plan  Dispo: The patient is from: Group home              Anticipated d/c is to: Group home              Patient currently is medically stable to d/c.   Difficult  to place patient Yes   Consultants:  Neurology  Procedures: None  Antimicrobials:  None  Subjective: Today, patient was seen and examined at bedside.  Was agitated during the nighttime but sleepy this morning.  Sitter at bedside.  Objective: Vitals:   07/05/21 1636 07/05/21 1955 07/06/21 0413 07/06/21 0733  BP: 121/76 (!) 144/89 134/69 122/70  Pulse: 85 99 94 92  Resp: 20 20 18 17   Temp: (!) 97.3 F (36.3 C) (!) 97.5 F (36.4 C) 97.6 F (36.4 C) 97.7 F (36.5 C)  TempSrc: Axillary Axillary Oral Axillary  SpO2: 96% 93% 98% 100%  Weight:      Height:        Intake/Output Summary (Last 24 hours) at 07/06/2021 1013 Last data filed at 07/05/2021 2010 Gross per 24 hour  Intake 660 ml  Output --  Net 660 ml     Filed Weights   06/01/21 1856 06/16/21 1507 06/29/21 1215  Weight: 61.2 kg 63.6 kg 60.6 kg    Physical examination: General:  Average built, not in obvious distress, nonverbal, sleepy at this time.   HENT:   No scleral pallor or icterus noted. Oral mucosa is moist.  Chest:   Diminished breath sounds bilaterally. No crackles or wheezes.  CVS: S1 &S2 heard. No murmur.  Regular rate and rhythm. Abdomen: Soft, nontender, nondistended.  Bowel sounds are heard.   Extremities: No cyanosis, clubbing or edema.  Peripheral pulses are palpable. Psych: Nonverbal, CNS: Nonverbal, sleepy at this time Skin: Warm and dry.  No rashes noted.   Data  Reviewed: I have personally reviewed following labs and imaging studies  CBC: Recent Labs  Lab 07/04/21 0434  WBC 6.2  HGB 12.6  HCT 39.2  MCV 114.3*  PLT 340     Basic Metabolic Panel: Recent Labs  Lab 07/04/21 0434  NA 140  K 4.0  CL 107  CO2 25  GLUCOSE 86  BUN 23  CREATININE 0.66  CALCIUM 8.8*     GFR: Estimated Creatinine Clearance: 58.5 mL/min (by C-G formula based on SCr of 0.66 mg/dL). Liver Function Tests: No results for input(s): AST, ALT, ALKPHOS, BILITOT, PROT, ALBUMIN in the last 168  hours.  No results for input(s): LIPASE, AMYLASE in the last 168 hours. No results for input(s): AMMONIA in the last 168 hours.  Coagulation Profile: No results for input(s): INR, PROTIME in the last 168 hours. Cardiac Enzymes: No results for input(s): CKTOTAL, CKMB, CKMBINDEX, TROPONINI in the last 168 hours.  BNP (last 3 results) No results for input(s): PROBNP in the last 8760 hours. HbA1C: No results for input(s): HGBA1C in the last 72 hours. CBG: No results for input(s): GLUCAP in the last 168 hours. Lipid Profile: No results for input(s): CHOL, HDL, LDLCALC, TRIG, CHOLHDL, LDLDIRECT in the last 72 hours. Thyroid Function Tests: No results for input(s): TSH, T4TOTAL, FREET4, T3FREE, THYROIDAB in the last 72 hours. Anemia Panel: No results for input(s): VITAMINB12, FOLATE, FERRITIN, TIBC, IRON, RETICCTPCT in the last 72 hours. Sepsis Labs: No results for input(s): PROCALCITON, LATICACIDVEN in the last 168 hours.   No results found for this or any previous visit (from the past 240 hour(s)).    Radiology Studies: No results found.    Scheduled Meds:  divalproex  500 mg Oral BID   enoxaparin (LOVENOX) injection  40 mg Subcutaneous Q24H   feeding supplement  237 mL Oral BID BM   hydrocortisone cream  1 application Topical TID   melatonin  5 mg Oral QHS   pantoprazole  40 mg Oral Daily   polyethylene glycol  17 g Oral BID   senna-docusate  2 tablet Oral BID   ziprasidone  20 mg Oral BID WC   Continuous Infusions:   LOS: 33 days   Joycelyn Das, MD [facility] P9/22/2022, 10:13 AM

## 2021-07-07 DIAGNOSIS — R451 Restlessness and agitation: Secondary | ICD-10-CM

## 2021-07-07 MED ORDER — HYDROCORTISONE 1 % EX CREA
1.0000 "application " | TOPICAL_CREAM | Freq: Three times a day (TID) | CUTANEOUS | Status: DC | PRN
  Administered 2021-07-09: 1 via TOPICAL
  Filled 2021-07-07: qty 28

## 2021-07-07 NOTE — Progress Notes (Signed)
PROGRESS NOTE    Sabrina Johnston  WCB:[id number] DOB: September 24, 1956 DOA: 06/01/2021 PCP: Lauro Regulus, MD    Brief Narrative: Patient is a 65 year old female with past medical history of intellectual disability, seizures, recent E. coli bacteremia presented to hospital from group home with falls and agitation.  At the group home, patient was noted to be difficult to manage and currently awaiting for placement.  Medications have been adjusted by psychiatry.    Assessment & Plan:   Principal Problem:   Severe intellectual disability Active Problems:   Generalized epilepsy (HCC)   Bacteremia due to Gram-negative bacteria   Abnormal TSH   Pressure ulcer with abrasion, blister, partial thickness skin loss involving epidermis and/or dermis (HCC)  Advanced cognitive impairment at baseline/intellectual/developmental delay:  At baseline, patient with cognitive and neurobehavioral dysfunction.  Nonverbal at baseline.  Neurology and psychiatry saw the patient during hospitalization..  On Valium IV as needed for agitation.  Sitter at bedside.  Continue melatonin, Geodon, trazodone.  Patient does have episodes of agitation in the hospital and was restless overnight.  Currently sleeping at the time of my evaluation.  History of epilepsy: On Depakote.  No further seizures.  AKI: Resolved with IV fluids.  Latest creatinine of 0.6  Macrocytic anemia: Vitamin B12 normal.  TSH slightly high but T3 and T4 normal.  No labs within the last 72 hours.  Rhabdomyolysis: Resolved  Constipation: Continue bowel regimen.  On MiraLAX and stool softener currently.  Right index finger skin lesion: Improved .  Generalized macular rash: Improved.  As needed hydrocortisone cream  DVT prophylaxis: Lovenox subcu  Code Status:  Full code  Family Communication:  None  Status is: Inpatient  Remains inpatient appropriate because:Unsafe d/c plan  Dispo: The patient is from: Group home               Anticipated d/c is to: Group home              Patient currently is medically stable to d/c.   Difficult to place patient Yes   Consultants:  Neurology  Procedures: None  Antimicrobials:  None  Subjective: Today, patient was seen and examined at bedside.  Was agitated during the nighttime but sleepy this morning.  Sitter at bedside.  Objective: Vitals:   07/07/21 0443 07/07/21 0800 07/07/21 1020 07/07/21 1205  BP: 119/60 (!) 92/48 102/75 92/71  Pulse: 85 69 88 82  Resp: 16 18  20   Temp: 97.7 F (36.5 C) 97.8 F (36.6 C)  (!) 97.3 F (36.3 C)  TempSrc:  Oral  Oral  SpO2: 93% 96%  98%  Weight:      Height:       No intake or output data in the 24 hours ending 07/07/21 1230   Filed Weights   06/01/21 1856 06/16/21 1507 06/29/21 1215  Weight: 61.2 kg 63.6 kg 60.6 kg    Physical examination: General:  Average built, not in obvious distress, nonverbal, sleepy at this time.   HENT:   No scleral pallor or icterus noted. Oral mucosa is moist.  Chest:   Diminished breath sounds bilaterally. No crackles or wheezes.  CVS: S1 &S2 heard. No murmur.  Regular rate and rhythm. Abdomen: Soft, nontender, nondistended.  Bowel sounds are heard.   Extremities: No cyanosis, clubbing or edema.  Peripheral pulses are palpable. Psych: Nonverbal, CNS: Nonverbal, sleepy at this time Skin: Warm and dry.  No rashes noted.   Data Reviewed: I have personally reviewed following labs  and imaging studies  CBC: Recent Labs  Lab 07/04/21 0434  WBC 6.2  HGB 12.6  HCT 39.2  MCV 114.3*  PLT 340     Basic Metabolic Panel: Recent Labs  Lab 07/04/21 0434  NA 140  K 4.0  CL 107  CO2 25  GLUCOSE 86  BUN 23  CREATININE 0.66  CALCIUM 8.8*     GFR: Estimated Creatinine Clearance: 58.5 mL/min (by C-G formula based on SCr of 0.66 mg/dL). Liver Function Tests: No results for input(s): AST, ALT, ALKPHOS, BILITOT, PROT, ALBUMIN in the last 168 hours.  No results for input(s): LIPASE,  AMYLASE in the last 168 hours. No results for input(s): AMMONIA in the last 168 hours.  Coagulation Profile: No results for input(s): INR, PROTIME in the last 168 hours. Cardiac Enzymes: No results for input(s): CKTOTAL, CKMB, CKMBINDEX, TROPONINI in the last 168 hours.  BNP (last 3 results) No results for input(s): PROBNP in the last 8760 hours. HbA1C: No results for input(s): HGBA1C in the last 72 hours. CBG: No results for input(s): GLUCAP in the last 168 hours. Lipid Profile: No results for input(s): CHOL, HDL, LDLCALC, TRIG, CHOLHDL, LDLDIRECT in the last 72 hours. Thyroid Function Tests: No results for input(s): TSH, T4TOTAL, FREET4, T3FREE, THYROIDAB in the last 72 hours. Anemia Panel: No results for input(s): VITAMINB12, FOLATE, FERRITIN, TIBC, IRON, RETICCTPCT in the last 72 hours. Sepsis Labs: No results for input(s): PROCALCITON, LATICACIDVEN in the last 168 hours.   No results found for this or any previous visit (from the past 240 hour(s)).    Radiology Studies: No results found.    Scheduled Meds:  divalproex  500 mg Oral BID   enoxaparin (LOVENOX) injection  40 mg Subcutaneous Q24H   feeding supplement  237 mL Oral BID BM   hydrocortisone cream  1 application Topical TID   melatonin  5 mg Oral QHS   pantoprazole  40 mg Oral Daily   polyethylene glycol  17 g Oral BID   senna-docusate  2 tablet Oral BID   ziprasidone  20 mg Oral BID WC   Continuous Infusions:   LOS: 34 days   Joycelyn Das, MD [facility] P9/23/2022, 12:30 PM

## 2021-07-08 NOTE — Plan of Care (Signed)
  Problem: Clinical Measurements: Goal: Diagnostic test results will improve 07/08/2021 1406 by Bernita Buffy, RN Outcome: Progressing 07/08/2021 1405 by Bernita Buffy, RN Outcome: Progressing   Problem: Clinical Measurements: Goal: Respiratory complications will improve 07/08/2021 1406 by Bernita Buffy, RN Outcome: Progressing 07/08/2021 1405 by Bernita Buffy, RN Outcome: Progressing   Problem: Clinical Measurements: Goal: Cardiovascular complication will be avoided 07/08/2021 1406 by Bernita Buffy, RN Outcome: Progressing 07/08/2021 1405 by Bernita Buffy, RN Outcome: Progressing   Problem: Nutrition: Goal: Adequate nutrition will be maintained Outcome: Progressing   Problem: Pain Managment: Goal: General experience of comfort will improve Outcome: Progressing   Problem: Safety: Goal: Ability to remain free from injury will improve Outcome: Progressing   Problem: Skin Integrity: Goal: Risk for impaired skin integrity will decrease Outcome: Progressing

## 2021-07-08 NOTE — Plan of Care (Signed)
  Problem: Safety: Goal: Ability to remain free from injury will improve Outcome: Progressing   Problem: Skin Integrity: Goal: Risk for impaired skin integrity will decrease Outcome: Progressing   Problem: Nutrition: Goal: Adequate nutrition will be maintained Outcome: Progressing   

## 2021-07-08 NOTE — Progress Notes (Signed)
PROGRESS NOTE    Sabrina Johnston  PYP:[id number] DOB: August 24, 1956 DOA: 06/01/2021 PCP: Lauro Regulus, MD    Brief Narrative: Patient is a 65 year old female with past medical history of intellectual disability, seizures, recent E. coli bacteremia presented to hospital from group home with falls and agitation.  At the group home, patient was noted to be difficult to manage and currently awaiting for placement.  Medications have been adjusted by psychiatry.    Assessment & Plan:   Principal Problem:   Severe intellectual disability Active Problems:   Generalized epilepsy (HCC)   Bacteremia due to Gram-negative bacteria   Abnormal TSH   Pressure ulcer with abrasion, blister, partial thickness skin loss involving epidermis and/or dermis (HCC)  Advanced cognitive impairment at baseline/intellectual/developmental delay:  At baseline, patient with cognitive and neurobehavioral dysfunction.  Nonverbal at baseline.  Neurology and psychiatry saw the patient during hospitalization..  On Valium IV as needed for agitation.  Sitter at bedside.  Continue melatonin, Geodon, trazodone.  Patient does have episodes of agitation in the hospital especially in the nighttime  History of epilepsy: On Depakote.  No further seizures.  AKI: Resolved with IV fluids.  Latest creatinine of 0.6  Macrocytic anemia: Vitamin B12 normal.  TSH slightly high but T3 and T4 normal.  No labs within the last 72 hours.  Rhabdomyolysis: Resolved  Constipation: Continue bowel regimen.  Had bowel movement yesterday.  Right index finger skin lesion: Improved .  Generalized macular rash: Improved.  On hydrocortisone cream.  DVT prophylaxis: Lovenox subcu  Code Status:  Full code  Family Communication:  None  Status is: Inpatient  Remains inpatient appropriate because:Unsafe d/c plan  Dispo: The patient is from: Group home              Anticipated d/c is to: Group home              Patient currently is  medically stable to d/c.   Difficult to place patient Yes   Consultants:  Neurology  Procedures: None  Antimicrobials:  None  Subjective: Today, patient was seen and examined at bedside.  Feels little agitated.  Objective: Vitals:   07/07/21 1205 07/07/21 1556 07/07/21 2020 07/08/21 0806  BP: 92/71 117/86 (!) 145/50 (!) 110/59  Pulse: 82 90 89 76  Resp: 20 20 16 18   Temp: (!) 97.3 F (36.3 C) (!) 97.3 F (36.3 C) 97.6 F (36.4 C) 97.6 F (36.4 C)  TempSrc: Oral Oral  Oral  SpO2: 98% 94% 96% 99%  Weight:      Height:       No intake or output data in the 24 hours ending 07/08/21 0959   Filed Weights   06/01/21 1856 06/16/21 1507 06/29/21 1215  Weight: 61.2 kg 63.6 kg 60.6 kg    Physical examination: General:  Average built, not in obvious distress, nonverbal, sleepy at this time HENT:   No scleral pallor or icterus noted. Oral mucosa is moist.  Chest:  Clear breath sounds.  Diminished breath sounds bilaterally. No crackles or wheezes.  CVS: S1 &S2 heard. No murmur.  Regular rate and rhythm. Abdomen: Soft, nontender, nondistended.  Bowel sounds are heard.   Extremities: No cyanosis, clubbing or edema.  Peripheral pulses are palpable. Psych: Sleepy at this time, nonverbal CNS:  No cranial nerve deficits.  Power equal in all extremities.   Skin: Warm and dry.  No rashes noted.  Data Reviewed: I have personally reviewed following labs and imaging studies  CBC:  Recent Labs  Lab 07/04/21 0434  WBC 6.2  HGB 12.6  HCT 39.2  MCV 114.3*  PLT 340     Basic Metabolic Panel: Recent Labs  Lab 07/04/21 0434  NA 140  K 4.0  CL 107  CO2 25  GLUCOSE 86  BUN 23  CREATININE 0.66  CALCIUM 8.8*     GFR: Estimated Creatinine Clearance: 58.5 mL/min (by C-G formula based on SCr of 0.66 mg/dL). Liver Function Tests: No results for input(s): AST, ALT, ALKPHOS, BILITOT, PROT, ALBUMIN in the last 168 hours.  No results for input(s): LIPASE, AMYLASE in the last  168 hours. No results for input(s): AMMONIA in the last 168 hours.  Coagulation Profile: No results for input(s): INR, PROTIME in the last 168 hours. Cardiac Enzymes: No results for input(s): CKTOTAL, CKMB, CKMBINDEX, TROPONINI in the last 168 hours.  BNP (last 3 results) No results for input(s): PROBNP in the last 8760 hours. HbA1C: No results for input(s): HGBA1C in the last 72 hours. CBG: No results for input(s): GLUCAP in the last 168 hours. Lipid Profile: No results for input(s): CHOL, HDL, LDLCALC, TRIG, CHOLHDL, LDLDIRECT in the last 72 hours. Thyroid Function Tests: No results for input(s): TSH, T4TOTAL, FREET4, T3FREE, THYROIDAB in the last 72 hours. Anemia Panel: No results for input(s): VITAMINB12, FOLATE, FERRITIN, TIBC, IRON, RETICCTPCT in the last 72 hours. Sepsis Labs: No results for input(s): PROCALCITON, LATICACIDVEN in the last 168 hours.   No results found for this or any previous visit (from the past 240 hour(s)).    Radiology Studies: No results found.    Scheduled Meds:  divalproex  500 mg Oral BID   enoxaparin (LOVENOX) injection  40 mg Subcutaneous Q24H   feeding supplement  237 mL Oral BID BM   melatonin  5 mg Oral QHS   pantoprazole  40 mg Oral Daily   polyethylene glycol  17 g Oral BID   senna-docusate  2 tablet Oral BID   ziprasidone  20 mg Oral BID WC   Continuous Infusions:   LOS: 35 days   Joycelyn Das, MD [facility] P9/24/2022, 9:59 AM

## 2021-07-09 NOTE — Progress Notes (Signed)
PROGRESS NOTE    Sabrina Johnston  ZYS:[id number] DOB: 10/07/1956 DOA: 06/01/2021 PCP: Lauro Regulus, MD    Brief Narrative: Patient is a 65 year old female with past medical history of intellectual disability, seizures, recent E. coli bacteremia presented to hospital from group home with falls and agitation.  At the group home, patient was noted to be difficult to manage and currently awaiting for placement.  Medications have been adjusted by psychiatry.    Assessment & Plan:   Principal Problem:   Severe intellectual disability Active Problems:   Generalized epilepsy (HCC)   Bacteremia due to Gram-negative bacteria   Abnormal TSH   Pressure ulcer with abrasion, blister, partial thickness skin loss involving epidermis and/or dermis (HCC)  Advanced cognitive impairment at baseline/intellectual/developmental delay:  At baseline, patient with cognitive and neurobehavioral dysfunction.  Nonverbal at baseline.  Neurology and psychiatry followed during hospitalization.  On Valium IV as needed for agitation.  Sitter at bedside.  Continue melatonin, Geodon, trazodone.  Patient does have episodes of agitation in the hospital especially in the nighttime but was more calm yesterday.  History of epilepsy: On Depakote.  No further seizures.  AKI: Solved.  Macrocytic anemia: Vitamin B12 normal.  TSH slightly high but T3 and T4 normal.    Rhabdomyolysis: Resolved  Constipation: Continue bowel regimen.  Generalized macular rash: Improved.    DVT prophylaxis: Lovenox subcu  Code Status:  Full code  Family Communication:  None  Status is: Inpatient  Remains inpatient appropriate because:Unsafe d/c plan  Dispo: The patient is from: Group home              Anticipated d/c is to: Group home              Patient currently is medically stable to d/c.   Difficult to place patient Yes   Consultants:  Neurology  Procedures: None  Antimicrobials:  None  Subjective: Today,  patient was seen and examined at bedside.  Had better night yesterday.  Objective: Vitals:   07/08/21 1652 07/08/21 1957 07/09/21 0628 07/09/21 0801  BP: 116/68 (!) 109/52 (!) 93/54 (!) 88/28  Pulse: 91 86 74 68  Resp: 20 17 16 18   Temp: (!) 97.4 F (36.3 C) 98.5 F (36.9 C) 97.8 F (36.6 C) 98.4 F (36.9 C)  TempSrc: Oral   Oral  SpO2: 100% 98% 96% 96%  Weight:      Height:        Intake/Output Summary (Last 24 hours) at 07/09/2021 1022 Last data filed at 07/09/2021 1001 Gross per 24 hour  Intake 660 ml  Output 3 ml  Net 657 ml     Filed Weights   06/01/21 1856 06/16/21 1507 06/29/21 1215  Weight: 61.2 kg 63.6 kg 60.6 kg    Physical examination: General:  Average built, not in obvious distress alert awake HENT:   No scleral pallor or icterus noted. Oral mucosa is moist.  Chest:  Clear breath sounds.  Diminished breath sounds bilaterally. No crackles or wheezes.  CVS: S1 &S2 heard. No murmur.  Regular rate and rhythm. Abdomen: Soft, nontender, nondistended.  Bowel sounds are heard.   Extremities: No cyanosis, clubbing or edema.  Peripheral pulses are palpable. Psych: Nonverbal. CNS:  No cranial nerve deficits.  Power equal in all extremities.   Skin: Warm and dry.  No rashes noted.   Data Reviewed: I have personally reviewed following labs and imaging studies  CBC: Recent Labs  Lab 07/04/21 0434  WBC 6.2  HGB 12.6  HCT 39.2  MCV 114.3*  PLT 340     Basic Metabolic Panel: Recent Labs  Lab 07/04/21 0434  NA 140  K 4.0  CL 107  CO2 25  GLUCOSE 86  BUN 23  CREATININE 0.66  CALCIUM 8.8*     GFR: Estimated Creatinine Clearance: 58.5 mL/min (by C-G formula based on SCr of 0.66 mg/dL). Liver Function Tests: No results for input(s): AST, ALT, ALKPHOS, BILITOT, PROT, ALBUMIN in the last 168 hours.  No results for input(s): LIPASE, AMYLASE in the last 168 hours. No results for input(s): AMMONIA in the last 168 hours.  Coagulation Profile: No  results for input(s): INR, PROTIME in the last 168 hours. Cardiac Enzymes: No results for input(s): CKTOTAL, CKMB, CKMBINDEX, TROPONINI in the last 168 hours.  BNP (last 3 results) No results for input(s): PROBNP in the last 8760 hours. HbA1C: No results for input(s): HGBA1C in the last 72 hours. CBG: No results for input(s): GLUCAP in the last 168 hours. Lipid Profile: No results for input(s): CHOL, HDL, LDLCALC, TRIG, CHOLHDL, LDLDIRECT in the last 72 hours. Thyroid Function Tests: No results for input(s): TSH, T4TOTAL, FREET4, T3FREE, THYROIDAB in the last 72 hours. Anemia Panel: No results for input(s): VITAMINB12, FOLATE, FERRITIN, TIBC, IRON, RETICCTPCT in the last 72 hours. Sepsis Labs: No results for input(s): PROCALCITON, LATICACIDVEN in the last 168 hours.   No results found for this or any previous visit (from the past 240 hour(s)).    Radiology Studies: No results found.    Scheduled Meds:  divalproex  500 mg Oral BID   enoxaparin (LOVENOX) injection  40 mg Subcutaneous Q24H   feeding supplement  237 mL Oral BID BM   melatonin  5 mg Oral QHS   pantoprazole  40 mg Oral Daily   polyethylene glycol  17 g Oral BID   senna-docusate  2 tablet Oral BID   ziprasidone  20 mg Oral BID WC   Continuous Infusions:   LOS: 36 days   Joycelyn Das, MD [facility] P9/25/2022, 10:22 AM

## 2021-07-10 NOTE — Progress Notes (Signed)
PROGRESS NOTE    Sabrina Johnston  KGM:[id number] DOB: [birth year] DOA: 06/01/2021 PCP: Lauro Regulus, MD    Brief Narrative:  Patient is a 65 year old female with past medical history of intellectual disability, seizures, recent E. coli bacteremia presented to hospital from group home with falls and agitation.  At the group home, patient was noted to be difficult to manage and currently awaiting for placement.  Medications have been adjusted by psychiatry.    Assessment & Plan:   Principal Problem:   Severe intellectual disability Active Problems:   Generalized epilepsy (HCC)   Bacteremia due to Gram-negative bacteria   Abnormal TSH   Pressure ulcer with abrasion, blister, partial thickness skin loss involving epidermis and/or dermis (HCC)  Advanced cognitive impairment at baseline/intellectual/developmental delay:   At baseline, patient with cognitive and neurobehavioral dysfunction.  Nonverbal at baseline.  Neurology and psychiatry followed during hospitalization.  On Valium IV as needed for agitation.  Sitter at bedside.  Continue melatonin, Geodon, trazodone.  Still with episodes of agitation in the nighttime.  History of epilepsy: On Depakote.  No further seizures.  AKI: Solved.  Macrocytic anemia: Vitamin B12 normal.  TSH slightly high but T3 and T4 normal.    Rhabdomyolysis: Resolved  Constipation: Continue bowel regimen.  Generalized macular rash: Improved.    DVT prophylaxis: Lovenox subcu  Code Status:  Full code  Family Communication:  None  Status is: Inpatient  Remains inpatient appropriate because:Unsafe d/c plan  Dispo: The patient is from: Group home              Anticipated d/c is to: Group home              Patient currently is medically stable to d/c.   Difficult to place patient Yes   Consultants:  Neurology  Procedures: None  Antimicrobials:  None  Subjective: Today, patient was seen and examined at bedside.  Had episodes of  agitation yesterday.  Objective: Vitals:   07/09/21 1915 07/10/21 0208 07/10/21 0556 07/10/21 0919  BP: (!) 103/37 139/63 (!) 122/54 (!) 141/74  Pulse: 87 87 82 83  Resp: 16 16 16 17   Temp: 97.6 F (36.4 C) 97.6 F (36.4 C) 97.6 F (36.4 C) 97.9 F (36.6 C)  TempSrc: Oral Oral Oral   SpO2: 98% 97% 97% 96%  Weight:      Height:        Intake/Output Summary (Last 24 hours) at 07/10/2021 1050 Last data filed at 07/09/2021 1716 Gross per 24 hour  Intake 300 ml  Output --  Net 300 ml     Filed Weights   06/01/21 1856 06/16/21 1507 06/29/21 1215  Weight: 61.2 kg 63.6 kg 60.6 kg    Physical examination:  General:  Average built, not in obvious distress but alert awake at this time. HENT:   No scleral pallor or icterus noted. Oral mucosa is moist.  Chest:  Clear breath sounds.  Diminished breath sounds bilaterally. No crackles or wheezes.  CVS: S1 &S2 heard. No murmur.  Regular rate and rhythm. Abdomen: Soft, nontender, nondistended.  Bowel sounds are heard.   Extremities: No cyanosis, clubbing or edema.  Peripheral pulses are palpable. Psych: Nonverbal. CNS:  No cranial nerve deficits.  Power equal in all extremities.   Skin: Warm and dry.  No rashes noted.   Data Reviewed: I have personally reviewed following labs and imaging studies  CBC: Recent Labs  Lab 07/04/21 0434  WBC 6.2  HGB 12.6  HCT  39.2  MCV 114.3*  PLT 340     Basic Metabolic Panel: Recent Labs  Lab 07/04/21 0434  NA 140  K 4.0  CL 107  CO2 25  GLUCOSE 86  BUN 23  CREATININE 0.66  CALCIUM 8.8*     GFR: Estimated Creatinine Clearance: 58.5 mL/min (by C-G formula based on SCr of 0.66 mg/dL). Liver Function Tests: No results for input(s): AST, ALT, ALKPHOS, BILITOT, PROT, ALBUMIN in the last 168 hours.  No results for input(s): LIPASE, AMYLASE in the last 168 hours. No results for input(s): AMMONIA in the last 168 hours.  Coagulation Profile: No results for input(s): INR, PROTIME  in the last 168 hours. Cardiac Enzymes: No results for input(s): CKTOTAL, CKMB, CKMBINDEX, TROPONINI in the last 168 hours.  BNP (last 3 results) No results for input(s): PROBNP in the last 8760 hours. HbA1C: No results for input(s): HGBA1C in the last 72 hours. CBG: No results for input(s): GLUCAP in the last 168 hours. Lipid Profile: No results for input(s): CHOL, HDL, LDLCALC, TRIG, CHOLHDL, LDLDIRECT in the last 72 hours. Thyroid Function Tests: No results for input(s): TSH, T4TOTAL, FREET4, T3FREE, THYROIDAB in the last 72 hours. Anemia Panel: No results for input(s): VITAMINB12, FOLATE, FERRITIN, TIBC, IRON, RETICCTPCT in the last 72 hours. Sepsis Labs: No results for input(s): PROCALCITON, LATICACIDVEN in the last 168 hours.   No results found for this or any previous visit (from the past 240 hour(s)).    Radiology Studies: No results found.    Scheduled Meds:  divalproex  500 mg Oral BID   enoxaparin (LOVENOX) injection  40 mg Subcutaneous Q24H   feeding supplement  237 mL Oral BID BM   melatonin  5 mg Oral QHS   pantoprazole  40 mg Oral Daily   polyethylene glycol  17 g Oral BID   senna-docusate  2 tablet Oral BID   ziprasidone  20 mg Oral BID WC   Continuous Infusions:   LOS: 37 days   Joycelyn Das, MD [facility] P9/26/2022, 10:50 AM

## 2021-07-10 NOTE — Plan of Care (Signed)
Patient with no changes this shift.  VSS.  Taking PO meds in applesauce.  Tylenol PO given x1 for signs of discomfort.  Continue 1:1 Recruitment consultant.   Problem: Education: Goal: Knowledge of General Education information will improve Description: Including pain rating scale, medication(s)/side effects and non-pharmacologic comfort measures Outcome: Progressing   Problem: Health Behavior/Discharge Planning: Goal: Ability to manage health-related needs will improve Outcome: Progressing   Problem: Clinical Measurements: Goal: Ability to maintain clinical measurements within normal limits will improve Outcome: Progressing Goal: Will remain free from infection Outcome: Progressing Goal: Diagnostic test results will improve Outcome: Progressing Goal: Respiratory complications will improve Outcome: Progressing Goal: Cardiovascular complication will be avoided Outcome: Progressing   Problem: Activity: Goal: Risk for activity intolerance will decrease Outcome: Progressing   Problem: Nutrition: Goal: Adequate nutrition will be maintained Outcome: Progressing   Problem: Coping: Goal: Level of anxiety will decrease Outcome: Progressing   Problem: Elimination: Goal: Will not experience complications related to bowel motility Outcome: Progressing Goal: Will not experience complications related to urinary retention Outcome: Progressing   Problem: Pain Managment: Goal: General experience of comfort will improve Outcome: Progressing   Problem: Safety: Goal: Ability to remain free from injury will improve Outcome: Progressing   Problem: Skin Integrity: Goal: Risk for impaired skin integrity will decrease Outcome: Progressing

## 2021-07-10 NOTE — Care Management Important Message (Signed)
Important Message  Patient Details  Name: Sabrina Johnston MRN: [id number] Date of Birth: 12/10/1955   Medicare Important Message Given:  Yes     Bernadette Hoit 07/10/2021, 12:29 PM

## 2021-07-11 MED ORDER — ENSURE ENLIVE PO LIQD
237.0000 mL | ORAL | Status: DC
  Administered 2021-07-12 – 2021-08-01 (×21): 237 mL via ORAL

## 2021-07-11 NOTE — TOC Progression Note (Addendum)
Transition of Care [facility]) - Progression Note    Patient Details  Name: Sabrina Johnston MRN: [id number] Date of Birth: 1956/05/26  Transition of Care [facility]) CM/SW Contact  Margarito Liner, LCSW Phone Number: 07/11/2021, 9:47 AM  Clinical Narrative:  Sabrina Johnston to Sabrina Johnston with [facility]. She is working on an internal referral. Earliest they might have placement would be 1-2 weeks. They are also working with her previous group home to see if they can take her back at an enhanced payment rate so they can hire more staff vs new group home placement. Sabrina Johnston said sister has also mentioned bringing her to [place] but is unsure if this is a concrete plan so she will continue to work on placement here until she hears otherwise.   2:34 pm: Received voicemail from Sabrina Johnston stating that her previous group home wanted to come assess her to make sure they could meet her needs. Will update team once date and time obtained.  Expected Discharge Plan: Group Home Barriers to Discharge: Requiring sitter/restraints, Unsafe home situation  Expected Discharge Plan and Services Expected Discharge Plan: Group Home   Discharge Planning Services: CM Consult   Living arrangements for the past 2 months: Group Home                 DME Arranged: N/A DME Agency: NA       HH Arranged: NA           Social Determinants of Health (SDOH) Interventions    Readmission Risk Interventions Readmission Risk Prevention Plan 06/06/2021  Post Dischage Appt Complete  Medication Screening Complete  Transportation Screening Complete  Some recent data might be hidden

## 2021-07-11 NOTE — Progress Notes (Signed)
Nutrition Follow-up  DOCUMENTATION CODES:  Not applicable  INTERVENTION:  Continue current diet as ordered, nursing staff to assist with meals Ensure Enlive po daily, each supplement provides 350 kcal and 20 grams of protein Weekly weights  NUTRITION DIAGNOSIS:  Inadequate oral intake related to  (altered mental status) as evidenced by  (routinely eating <75% of meals).  GOAL:  Patient will meet greater than or equal to 90% of their needs  MONITOR:  PO intake, Supplement acceptance  REASON FOR ASSESSMENT:  Consult Calorie Count  ASSESSMENT:  65 y.o. female presented to the ED with AMS and falls after being discharged back to her group home 4 hours prior to return. PMH of mental retardation, epilepsy, and chronic constipation. Pt nonverbal at baseline. Previous admission for sepsis due to UTI.  Pt remains medically stable at this time, intake improved from last week. Weight appears overall stable this admission, will decrease ensure to 1x/d as intake is consistent and appears to be improved from earlier in admission.  Discussed in rounds, case management searching for discharge options, [facility] caseworker assisting.   Average Meal Intake: 8/18-9/2: 54% intake x 18 recorded meals (0-100%) 9/3-9/8: 46% intake x 10 recorded meals (0-100%) 9/9-9/15: 81% intake x 10 recorded meals (0-100%) 9/16-9/20: 75% intake x 7 recorded meals (50-100%) 9/21-9/27: 92% intake x 10 recorded meals (75-100%)  Nutritionally Relevant Medications: Scheduled Meds:  feeding supplement  237 mL Oral BID BM   pantoprazole  40 mg Oral Daily   polyethylene glycol  17 g Oral BID   senna-docusate  2 tablet Oral BID   PRN Meds: bisacodyl, ondansetron  Labs Reviewed  NUTRITION - FOCUSED PHYSICAL EXAM: Flowsheet Row Most Recent Value  Orbital Region No depletion  Upper Arm Region No depletion  Thoracic and Lumbar Region No depletion  Buccal Region No depletion  Temple Region No depletion   Clavicle Bone Region No depletion  Clavicle and Acromion Bone Region No depletion  Scapular Bone Region No depletion  Dorsal Hand No depletion  Patellar Region No depletion  Anterior Thigh Region No depletion  Posterior Calf Region No depletion  Edema (RD Assessment) None  Hair Reviewed  Eyes Unable to assess  Mouth Unable to assess  Skin Reviewed  Nails Reviewed   Diet Order:   Diet Order             DIET DYS 3 Room service appropriate? Yes; Fluid consistency: Thin  Diet effective now                   EDUCATION NEEDS:  No education needs have been identified at this time  Skin:  Skin Assessment: Reviewed RN Assessment  Last BM:  9/25 - type 2  Height:  Ht Readings from Last 1 Encounters:  06/01/21 5' (1.524 m)   Weight:  Wt Readings from Last 1 Encounters:  07/10/21 65.5 kg   Ideal Body Weight:  45.5 kg  BMI:  Body mass index is 28.18 kg/m.  Estimated Nutritional Needs:  Kcal:  1400-1600 kcal/d Protein:  70-80 g/d Fluid:  1.5-1.8 L/d   Greig Castilla, RD, LDN Clinical Dietitian Pager on Amion

## 2021-07-11 NOTE — Progress Notes (Signed)
PROGRESS NOTE    Sabrina Johnston  YQM:[id number] DOB: 1956-03-15 DOA: 06/01/2021 PCP: Lauro Regulus, MD    Brief Narrative:  Patient is a 65 year old female with past medical history of intellectual disability, seizures, recent E. coli bacteremia presented to hospital from group home with falls and agitation.  At the group home, patient was noted to be difficult to manage and currently awaiting for placement.  Medications have been adjusted by psychiatry.    Assessment & Plan:   Principal Problem:   Severe intellectual disability Active Problems:   Generalized epilepsy (HCC)   Bacteremia due to Gram-negative bacteria   Abnormal TSH   Pressure ulcer with abrasion, blister, partial thickness skin loss involving epidermis and/or dermis (HCC)  Advanced cognitive impairment at baseline/intellectual/developmental delay:   At baseline, patient with cognitive and neurobehavioral dysfunction.  Nonverbal at baseline.  Neurology and psychiatry followed the patient during hospitalization.  On Valium IV as needed for agitation.  Sitter at bedside.  Continue melatonin, Geodon, trazodone.  Still with episodes of agitation in the nighttime.  But overall stable.  History of epilepsy: On Depakote.  No further seizures.  AKI: Solved.  Macrocytic anemia: Vitamin B12 normal.  TSH slightly high but T3 and T4 normal.    Rhabdomyolysis: Resolved  Constipation: Continue bowel regimen.  Generalized macular rash: Improved.    DVT prophylaxis: Lovenox subcu  Code Status:  Full code  Family Communication:  None  Status is: Inpatient  Remains inpatient appropriate because:Unsafe d/c plan  Dispo: The patient is from: Group home              Anticipated d/c is to: Group home              Patient currently is medically stable to d/c.   Difficult to place patient Yes  Consultants:  Neurology  Procedures: None  Antimicrobials:  None  Subjective:  Patient was seen and examined at  bedside.  Sitter at bedside.  No interval complaints reported except for mild agitation in the nighttime.  Objective: Vitals:   07/11/21 0009 07/11/21 0636 07/11/21 0714 07/11/21 1217  BP: 118/75 118/70 113/79 118/87  Pulse: 90 89 88 98  Resp: 18 16 17 17   Temp: 97.6 F (36.4 C) 98.4 F (36.9 C) 97.8 F (36.6 C) 97.7 F (36.5 C)  TempSrc: Axillary     SpO2: 98% 96% 98% 100%  Weight:      Height:        Intake/Output Summary (Last 24 hours) at 07/11/2021 1258 Last data filed at 07/11/2021 1037 Gross per 24 hour  Intake 0 ml  Output --  Net 0 ml     Filed Weights   06/16/21 1507 06/29/21 1215 07/10/21 1416  Weight: 63.6 kg 60.6 kg 65.5 kg    Physical examination:  General:  Average built, not in obvious distress HENT:   No scleral pallor or icterus noted. Oral mucosa is moist.  Chest:  Clear breath sounds.  Diminished breath sounds bilaterally. No crackles or wheezes.  CVS: S1 &S2 heard. No murmur.  Regular rate and rhythm. Abdomen: Soft, nontender, nondistended.  Bowel sounds are heard.   Extremities: No cyanosis, clubbing or edema.  Peripheral pulses are palpable. Psych: Alert awake, nonverbal. CNS:  No cranial nerve deficits.  Power equal in all extremities.   Skin: Warm and dry.  No rashes noted.   Data Reviewed: I have personally reviewed following labs and imaging studies  CBC: No results for input(s): WBC, NEUTROABS,  HGB, HCT, MCV, PLT in the last 168 hours.   Basic Metabolic Panel: No results for input(s): NA, K, CL, CO2, GLUCOSE, BUN, CREATININE, CALCIUM, MG, PHOS in the last 168 hours.   GFR: Estimated Creatinine Clearance: 60.8 mL/min (by C-G formula based on SCr of 0.66 mg/dL). Liver Function Tests: No results for input(s): AST, ALT, ALKPHOS, BILITOT, PROT, ALBUMIN in the last 168 hours.  No results for input(s): LIPASE, AMYLASE in the last 168 hours. No results for input(s): AMMONIA in the last 168 hours.  Coagulation Profile: No results for  input(s): INR, PROTIME in the last 168 hours. Cardiac Enzymes: No results for input(s): CKTOTAL, CKMB, CKMBINDEX, TROPONINI in the last 168 hours.  BNP (last 3 results) No results for input(s): PROBNP in the last 8760 hours. HbA1C: No results for input(s): HGBA1C in the last 72 hours. CBG: No results for input(s): GLUCAP in the last 168 hours. Lipid Profile: No results for input(s): CHOL, HDL, LDLCALC, TRIG, CHOLHDL, LDLDIRECT in the last 72 hours. Thyroid Function Tests: No results for input(s): TSH, T4TOTAL, FREET4, T3FREE, THYROIDAB in the last 72 hours. Anemia Panel: No results for input(s): VITAMINB12, FOLATE, FERRITIN, TIBC, IRON, RETICCTPCT in the last 72 hours. Sepsis Labs: No results for input(s): PROCALCITON, LATICACIDVEN in the last 168 hours.   No results found for this or any previous visit (from the past 240 hour(s)).    Radiology Studies: No results found.    Scheduled Meds:  divalproex  500 mg Oral BID   enoxaparin (LOVENOX) injection  40 mg Subcutaneous Q24H   [START ON 07/12/2021] feeding supplement  237 mL Oral Q24H   melatonin  5 mg Oral QHS   pantoprazole  40 mg Oral Daily   polyethylene glycol  17 g Oral BID   senna-docusate  2 tablet Oral BID   ziprasidone  20 mg Oral BID WC   Continuous Infusions:   LOS: 38 days   Sabrina Das, MD [facility] P9/27/2022, 12:58 PM

## 2021-07-12 LAB — BASIC METABOLIC PANEL
Anion gap: 7 (ref 5–15)
BUN: 26 mg/dL — ABNORMAL HIGH (ref 8–23)
CO2: 25 mmol/L (ref 22–32)
Calcium: 9 mg/dL (ref 8.9–10.3)
Chloride: 109 mmol/L (ref 98–111)
Creatinine, Ser: 0.75 mg/dL (ref 0.44–1.00)
GFR, Estimated: >60 mL/min (ref >60)
Glucose, Bld: 84 mg/dL (ref 70–99)
Potassium: 3.9 mmol/L (ref 3.5–5.1)
Sodium: 141 mmol/L (ref 135–145)

## 2021-07-12 LAB — CBC
HCT: 36.2 % (ref 36.0–46.0)
Hemoglobin: 12.2 g/dL (ref 12.0–15.0)
MCH: 38.1 pg — ABNORMAL HIGH (ref 26.0–34.0)
MCHC: 33.7 g/dL (ref 30.0–36.0)
MCV: 113.1 fL — ABNORMAL HIGH (ref 80.0–100.0)
Platelets: 286 10*3/uL (ref 150–400)
RBC: 3.2 MIL/uL — ABNORMAL LOW (ref 3.87–5.11)
RDW: 14.6 % (ref 11.5–15.5)
WBC: 6.8 10*3/uL (ref 4.0–10.5)
nRBC: 0 % (ref 0.0–0.2)

## 2021-07-12 NOTE — TOC Progression Note (Signed)
Transition of Care [facility]) - Progression Note    Patient Details  Name: Sabrina Johnston MRN: [id number] Date of Birth: [birth year]  Transition of Care [facility]) CM/SW Contact  Margarito Liner, LCSW Phone Number: 07/12/2021, 4:10 PM  Clinical Narrative:   Spoke to two nurses today that voiced concerns about return to [facility]. Updated [name] with [facility]. She will make concerns known at her meeting this afternoon. Spoke with RN about APS report. Sent her a secure chat stating it would be better for her to make report since she has first-hand experience with patient.  Expected Discharge Plan: Group Home Barriers to Discharge: Requiring sitter/restraints, Unsafe home situation  Expected Discharge Plan and Services Expected Discharge Plan: Group Home   Discharge Planning Services: CM Consult   Living arrangements for the past 2 months: Group Home                 DME Arranged: N/A DME Agency: NA       HH Arranged: NA           Social Determinants of Health (SDOH) Interventions    Readmission Risk Interventions Readmission Risk Prevention Plan 06/06/2021  Post Dischage Appt Complete  Medication Screening Complete  Transportation Screening Complete  Some recent data might be hidden

## 2021-07-12 NOTE — Progress Notes (Signed)
PROGRESS NOTE    Sabrina Johnston   DXA:[id number]  DOB: April 10, 1956  DOA: 06/01/2021 PCP: Lauro Regulus, MD   Brief Narrative:  Sabrina Johnston is a 65 year old female with cognitive and behavioral dysfunction, seizure disorder and chronic constipation who presented back to the ED about 4 hours after she was discharged from the hospital to a group home.  She was sent back due to multiple falls and agitation.  She had recently been evaluated in the hospital and treated for E. coli sepsis with IV antibiotics. In the ED, lactic acid was noted to be 5.6.  She required sedation to get a CT scan which were found to be unremarkable.  She also required a bear hugger transiently for hypothermia. She has subsequently remained intermittently agitated and has required a sitter at the bedside.  She is unable to return to the group home and we have had difficulty finding a safe disposition for her.  Subjective: No complaints    Assessment & Plan:   Principal Problem:   Severe intellectual disability-unsafe to live alone and difficult to maintain at a group home due to behaviors - She has been evaluated by psychiatry and currently is receiving Geodon twice daily  -she continues to require a safety sitter due to unsafe behaviors and impulsivity - Of note the patient was able to ambulate earlier on in the hospital stay but has not been ambulating recently-I have had a discussion with PT to see if she is still able to get out of bed  Active Problems:   Generalized epilepsy (HCC) -Continue Depakote    Bacteremia due to Gram-negative bacteria -Treated on recent admission   Time spent in minutes: 35 DVT prophylaxis: enoxaparin (LOVENOX) injection 40 mg Start: 06/07/21 2200 Place and maintain sequential compression device Start: 06/07/21 1434  Code Status: DO NOT RESUSCITATE Family Communication:  Level of Care: Level of care: Med-Surg Disposition Plan:  Status is: Inpatient  Remains  inpatient appropriate because:Unsafe d/c plan  Dispo: The patient is from: Group home              Anticipated d/c is to:  To be determined              Patient currently is not medically stable to d/c.   Difficult to place patient Yes      Consultants:  Psychiatry Palliative care Neurology Procedures:   Antimicrobials:  Anti-infectives (From admission, onward)    Start     Dose/Rate Route Frequency Ordered Stop   06/04/21 1000  cephALEXin (KEFLEX) capsule 500 mg        500 mg Oral 4 times daily 06/04/21 0811 06/08/21 2329   06/02/21 1000  cefTRIAXone (ROCEPHIN) 2 g in sodium chloride 0.9 % 100 mL IVPB  Status:  Discontinued        2 g 200 mL/hr over 30 Minutes Intravenous Every 24 hours 06/02/21 0950 06/04/21 0810   06/02/21 0345  cefTRIAXone (ROCEPHIN) 1 g in sodium chloride 0.9 % 100 mL IVPB        1 g 200 mL/hr over 30 Minutes Intravenous  Once 06/02/21 0330 06/02/21 0452   06/01/21 2200  cefTRIAXone (ROCEPHIN) 1 g in sodium chloride 0.9 % 100 mL IVPB        1 g 200 mL/hr over 30 Minutes Intravenous  Once 06/01/21 2150 06/02/21 0039        Objective: Vitals:   07/11/21 1623 07/11/21 1924 07/12/21 0008 07/12/21 0415  BP: 116/70 100/65  106/88 101/61  Pulse: (!) 105 97 93 90  Resp: 17 18 18 18   Temp: 97.7 F (36.5 C) 98 F (36.7 C) (!) 97.1 F (36.2 C) (!) 97.5 F (36.4 C)  TempSrc:      SpO2: (!) 83% 95% 96% 97%  Weight:      Height:        Intake/Output Summary (Last 24 hours) at 07/12/2021 1032 Last data filed at 07/11/2021 1037 Gross per 24 hour  Intake 0 ml  Output --  Net 0 ml   Filed Weights   06/16/21 1507 06/29/21 1215 07/10/21 1416  Weight: 63.6 kg 60.6 kg 65.5 kg    Examination: General exam: Appears comfortable  HEENT: PERRLA, oral mucosa moist, no sclera icterus or thrush Respiratory system: Clear to auscultation. Respiratory effort normal. Cardiovascular system: S1 & S2 heard, RRR.   Gastrointestinal system: Abdomen soft, non-tender,  nondistended. Normal bowel sounds. Central nervous system:  No focal neurological deficits. Extremities: No cyanosis, clubbing or edema Skin: No rashes or ulcers Psychiatry: Cannot hold a conversation-appears restless    Data Reviewed: I have personally reviewed following labs and imaging studies  CBC: Recent Labs  Lab 07/12/21 0344  WBC 6.8  HGB 12.2  HCT 36.2  MCV 113.1*  PLT 286   Basic Metabolic Panel: Recent Labs  Lab 07/12/21 0344  NA 141  K 3.9  CL 109  CO2 25  GLUCOSE 84  BUN 26*  CREATININE 0.75  CALCIUM 9.0   GFR: Estimated Creatinine Clearance: 60.8 mL/min (by C-G formula based on SCr of 0.75 mg/dL). Liver Function Tests: No results for input(s): AST, ALT, ALKPHOS, BILITOT, PROT, ALBUMIN in the last 168 hours. No results for input(s): LIPASE, AMYLASE in the last 168 hours. No results for input(s): AMMONIA in the last 168 hours. Coagulation Profile: No results for input(s): INR, PROTIME in the last 168 hours. Cardiac Enzymes: No results for input(s): CKTOTAL, CKMB, CKMBINDEX, TROPONINI in the last 168 hours. BNP (last 3 results) No results for input(s): PROBNP in the last 8760 hours. HbA1C: No results for input(s): HGBA1C in the last 72 hours. CBG: No results for input(s): GLUCAP in the last 168 hours. Lipid Profile: No results for input(s): CHOL, HDL, LDLCALC, TRIG, CHOLHDL, LDLDIRECT in the last 72 hours. Thyroid Function Tests: No results for input(s): TSH, T4TOTAL, FREET4, T3FREE, THYROIDAB in the last 72 hours. Anemia Panel: No results for input(s): VITAMINB12, FOLATE, FERRITIN, TIBC, IRON, RETICCTPCT in the last 72 hours. Urine analysis:    Component Value Date/Time   COLORURINE YELLOW (A) 06/02/2021 0018   APPEARANCEUR HAZY (A) 06/02/2021 0018   APPEARANCEUR Cloudy (A) 10/26/2016 1137   LABSPEC 1.006 06/02/2021 0018   PHURINE 7.0 06/02/2021 0018   GLUCOSEU NEGATIVE 06/02/2021 0018   HGBUR NEGATIVE 06/02/2021 0018   BILIRUBINUR NEGATIVE  06/02/2021 0018   BILIRUBINUR 1+ 11/01/2017 1630   BILIRUBINUR Negative 10/26/2016 1137   KETONESUR 5 (A) 06/02/2021 0018   PROTEINUR NEGATIVE 06/02/2021 0018   UROBILINOGEN 0.2 11/01/2017 1630   NITRITE NEGATIVE 06/02/2021 0018   LEUKOCYTESUR NEGATIVE 06/02/2021 0018   Sepsis Labs: @LABRCNTIP (procalcitonin:4,lacticidven:4) )No results found for this or any previous visit (from the past 240 hour(s)).       Radiology Studies: No results found.    Scheduled Meds:  divalproex  500 mg Oral BID   enoxaparin (LOVENOX) injection  40 mg Subcutaneous Q24H   feeding supplement  237 mL Oral Q24H   melatonin  5 mg Oral QHS  pantoprazole  40 mg Oral Daily   polyethylene glycol  17 g Oral BID   senna-docusate  2 tablet Oral BID   ziprasidone  20 mg Oral BID WC   Continuous Infusions:   LOS: 39 days      Calvert Cantor, MD [facility] Pager: [email or web address] 07/12/2021, 10:32 AM

## 2021-07-13 NOTE — Progress Notes (Signed)
PROGRESS NOTE    ELFRIEDE Johnston   YQM:[id number]  DOB: 05-Feb-1956  DOA: 06/01/2021 PCP: Lauro Regulus, MD   Brief Narrative:  Sabrina Johnston is a 65 year old female with cognitive and behavioral dysfunction, seizure disorder and chronic constipation who presented back to the ED about 4 hours after she was discharged from the hospital to a group home.  She was sent back due to multiple falls and agitation.  She had recently been evaluated in the hospital and treated for E. coli sepsis with IV antibiotics. In the ED, lactic acid was noted to be 5.6.  She required sedation to get a CT scan which were found to be unremarkable.  She also required a bear hugger transiently for hypothermia. She has subsequently remained intermittently agitated and has required a sitter at the bedside.  She is unable to return to the group home and we have had difficulty finding a safe disposition for her.  Subjective: Nonverbal    Assessment & Plan:   Principal Problem:   Severe intellectual disability-unsafe to live alone and difficult to maintain at a group home due to behaviors - She has been evaluated by psychiatry and currently is receiving Geodon twice daily  -she continues to require a safety sitter due to unsafe behaviors and impulsivity - Of note the patient was able to ambulate earlier on in the hospital stay but has not been ambulating recently- - 9/28> I have had a discussion with PT to see if she is still able to get out of bed  Active Problems:   Generalized epilepsy (HCC) -Continue Depakote    Bacteremia due to Gram-negative bacteria -Treated on recent admission   Time spent in minutes: 35 DVT prophylaxis: enoxaparin (LOVENOX) injection 40 mg Start: 06/07/21 2200 Place and maintain sequential compression device Start: 06/07/21 1434  Code Status: DO NOT RESUSCITATE Family Communication:  Level of Care: Level of care: Med-Surg Disposition Plan:  Status is:  Inpatient  Remains inpatient appropriate because:Unsafe d/c plan  Dispo: The patient is from: Group home              Anticipated d/c is to:  To be determined              Patient currently is not medically stable to d/c.   Difficult to place patient Yes      Consultants:  Psychiatry Palliative care Neurology Procedures:   Antimicrobials:  Anti-infectives (From admission, onward)    Start     Dose/Rate Route Frequency Ordered Stop   06/04/21 1000  cephALEXin (KEFLEX) capsule 500 mg        500 mg Oral 4 times daily 06/04/21 0811 06/08/21 2329   06/02/21 1000  cefTRIAXone (ROCEPHIN) 2 g in sodium chloride 0.9 % 100 mL IVPB  Status:  Discontinued        2 g 200 mL/hr over 30 Minutes Intravenous Every 24 hours 06/02/21 0950 06/04/21 0810   06/02/21 0345  cefTRIAXone (ROCEPHIN) 1 g in sodium chloride 0.9 % 100 mL IVPB        1 g 200 mL/hr over 30 Minutes Intravenous  Once 06/02/21 0330 06/02/21 0452   06/01/21 2200  cefTRIAXone (ROCEPHIN) 1 g in sodium chloride 0.9 % 100 mL IVPB        1 g 200 mL/hr over 30 Minutes Intravenous  Once 06/01/21 2150 06/02/21 0039        Objective: Vitals:   07/12/21 1630 07/12/21 1929 07/12/21 2343 07/13/21 0517  BP:  129/66 113/63 122/75 139/70  Pulse: 85 86 87 84  Resp: 20 20 18 18   Temp: (!) 97.5 F (36.4 C) 98.1 F (36.7 C) (!) 97.5 F (36.4 C) 97.8 F (36.6 C)  TempSrc: Oral   Oral  SpO2: 97% 98% 98% 97%  Weight:      Height:        Intake/Output Summary (Last 24 hours) at 07/13/2021 1115 Last data filed at 07/13/2021 1031 Gross per 24 hour  Intake 480 ml  Output --  Net 480 ml    Filed Weights   06/16/21 1507 06/29/21 1215 07/10/21 1416  Weight: 63.6 kg 60.6 kg 65.5 kg    Examination: General exam: Appears comfortable today, laying in bed, stares at me when I speak but does not respone HEENT: PERRLA, oral mucosa moist, no sclera icterus or thrush Respiratory system: Clear to auscultation. Respiratory effort  normal. Cardiovascular system: S1 & S2 heard, regular rate and rhythm Gastrointestinal system: Abdomen soft, non-tender, nondistended. Normal bowel sounds   Central nervous system: Alert and oriented. No focal neurological deficits. Extremities: No cyanosis, clubbing or edema Skin: No rashes or ulcers Psychiatry:  difficult to assess    Data Reviewed: I have personally reviewed following labs and imaging studies  CBC: Recent Labs  Lab 07/12/21 0344  WBC 6.8  HGB 12.2  HCT 36.2  MCV 113.1*  PLT 286    Basic Metabolic Panel: Recent Labs  Lab 07/12/21 0344  NA 141  K 3.9  CL 109  CO2 25  GLUCOSE 84  BUN 26*  CREATININE 0.75  CALCIUM 9.0    GFR: Estimated Creatinine Clearance: 60.8 mL/min (by C-G formula based on SCr of 0.75 mg/dL). Liver Function Tests: No results for input(s): AST, ALT, ALKPHOS, BILITOT, PROT, ALBUMIN in the last 168 hours. No results for input(s): LIPASE, AMYLASE in the last 168 hours. No results for input(s): AMMONIA in the last 168 hours. Coagulation Profile: No results for input(s): INR, PROTIME in the last 168 hours. Cardiac Enzymes: No results for input(s): CKTOTAL, CKMB, CKMBINDEX, TROPONINI in the last 168 hours. BNP (last 3 results) No results for input(s): PROBNP in the last 8760 hours. HbA1C: No results for input(s): HGBA1C in the last 72 hours. CBG: No results for input(s): GLUCAP in the last 168 hours. Lipid Profile: No results for input(s): CHOL, HDL, LDLCALC, TRIG, CHOLHDL, LDLDIRECT in the last 72 hours. Thyroid Function Tests: No results for input(s): TSH, T4TOTAL, FREET4, T3FREE, THYROIDAB in the last 72 hours. Anemia Panel: No results for input(s): VITAMINB12, FOLATE, FERRITIN, TIBC, IRON, RETICCTPCT in the last 72 hours. Urine analysis:    Component Value Date/Time   COLORURINE YELLOW (A) 06/02/2021 0018   APPEARANCEUR HAZY (A) 06/02/2021 0018   APPEARANCEUR Cloudy (A) 10/26/2016 1137   LABSPEC 1.006 06/02/2021 0018    PHURINE 7.0 06/02/2021 0018   GLUCOSEU NEGATIVE 06/02/2021 0018   HGBUR NEGATIVE 06/02/2021 0018   BILIRUBINUR NEGATIVE 06/02/2021 0018   BILIRUBINUR 1+ 11/01/2017 1630   BILIRUBINUR Negative 10/26/2016 1137   KETONESUR 5 (A) 06/02/2021 0018   PROTEINUR NEGATIVE 06/02/2021 0018   UROBILINOGEN 0.2 11/01/2017 1630   NITRITE NEGATIVE 06/02/2021 0018   LEUKOCYTESUR NEGATIVE 06/02/2021 0018   Sepsis Labs: @LABRCNTIP (procalcitonin:4,lacticidven:4) )No results found for this or any previous visit (from the past 240 hour(s)).       Radiology Studies: No results found.    Scheduled Meds:  divalproex  500 mg Oral BID   enoxaparin (LOVENOX) injection  40 mg Subcutaneous  Q24H   feeding supplement  237 mL Oral Q24H   melatonin  5 mg Oral QHS   pantoprazole  40 mg Oral Daily   polyethylene glycol  17 g Oral BID   senna-docusate  2 tablet Oral BID   ziprasidone  20 mg Oral BID WC   Continuous Infusions:   LOS: 40 days      Calvert Cantor, MD [facility] Pager: [email or web address] 07/13/2021, 11:15 AM

## 2021-07-14 NOTE — Plan of Care (Signed)
Patient ambulated in [facility] to nurses station with contact guard assist.  VSS.  Taking PO meds in ice cream  Agitated this evening.  Valium given with improvement noted.  Continue 1:1 Recruitment consultant.  Problem: Education: Goal: Knowledge of General Education information will improve Description: Including pain rating scale, medication(s)/side effects and non-pharmacologic comfort measures Outcome: Progressing   Problem: Health Behavior/Discharge Planning: Goal: Ability to manage health-related needs will improve Outcome: Progressing   Problem: Clinical Measurements: Goal: Ability to maintain clinical measurements within normal limits will improve Outcome: Progressing Goal: Will remain free from infection Outcome: Progressing Goal: Diagnostic test results will improve Outcome: Progressing Goal: Respiratory complications will improve Outcome: Progressing Goal: Cardiovascular complication will be avoided Outcome: Progressing   Problem: Activity: Goal: Risk for activity intolerance will decrease Outcome: Progressing   Problem: Nutrition: Goal: Adequate nutrition will be maintained Outcome: Progressing   Problem: Coping: Goal: Level of anxiety will decrease Outcome: Progressing   Problem: Elimination: Goal: Will not experience complications related to bowel motility Outcome: Progressing Goal: Will not experience complications related to urinary retention Outcome: Progressing   Problem: Pain Managment: Goal: General experience of comfort will improve Outcome: Progressing   Problem: Safety: Goal: Ability to remain free from injury will improve Outcome: Progressing   Problem: Skin Integrity: Goal: Risk for impaired skin integrity will decrease Outcome: Progressing

## 2021-07-14 NOTE — Progress Notes (Signed)
PROGRESS NOTE    [name]   PNT:[id number]  DOB: August 09, 1956  DOA: 06/01/2021 PCP: Lauro Regulus, MD   Brief Narrative:  Sabrina Johnston is a 65 year old female with cognitive and behavioral dysfunction, seizure disorder and chronic constipation who presented back to the ED about 4 hours after she was discharged from the hospital to a group home.  She was sent back due to multiple falls and agitation.  She had recently been evaluated in the hospital and treated for E. coli sepsis with IV antibiotics. In the ED, lactic acid was noted to be 5.6.  She required sedation to get a CT scan which were found to be unremarkable.  She also required a bear hugger transiently for hypothermia. She has subsequently remained intermittently agitated and has required a sitter at the bedside.  She is unable to return to the group home and we have had difficulty finding a safe disposition for her.  Subjective: Nonverbal    Assessment & Plan:   Principal Problem:   Severe intellectual disability-unsafe to live alone and difficult to maintain at a group home due to behaviors - She has been evaluated by psychiatry and currently is receiving Geodon twice daily  -she continues to require a safety sitter due to unsafe behaviors and impulsivity - Of note the patient was able to ambulate earlier on in the hospital stay but has not been ambulating recently- - 9/28> I have had a discussion with PT to see if she is still able to get out of bed - 9/30> I have discussed with charge RN that patient has not been allowed to get out of bed and advised her that she was ambulatory when she first was admitted and should be allowed to get out of bed multiple times a day  Active Problems:   Generalized epilepsy (HCC) -Continue Depakote    Bacteremia due to Gram-negative bacteria -Treated on recent admission   Time spent in minutes: 35 DVT prophylaxis: enoxaparin (LOVENOX) injection 40 mg Start: 06/07/21  2200 Place and maintain sequential compression device Start: 06/07/21 1434  Code Status: DO NOT RESUSCITATE Family Communication:  Level of Care: Level of care: Med-Surg Disposition Plan:  Status is: Inpatient  Remains inpatient appropriate because:Unsafe d/c plan  Dispo: The patient is from: Group home              Anticipated d/c is to:  To be determined              Patient currently is not medically stable to d/c.   Difficult to place patient Yes      Consultants:  Psychiatry Palliative care Neurology Procedures:   Antimicrobials:  Anti-infectives (From admission, onward)    Start     Dose/Rate Route Frequency Ordered Stop   06/04/21 1000  cephALEXin (KEFLEX) capsule 500 mg        500 mg Oral 4 times daily 06/04/21 0811 06/08/21 2329   06/02/21 1000  cefTRIAXone (ROCEPHIN) 2 g in sodium chloride 0.9 % 100 mL IVPB  Status:  Discontinued        2 g 200 mL/hr over 30 Minutes Intravenous Every 24 hours 06/02/21 0950 06/04/21 0810   06/02/21 0345  cefTRIAXone (ROCEPHIN) 1 g in sodium chloride 0.9 % 100 mL IVPB        1 g 200 mL/hr over 30 Minutes Intravenous  Once 06/02/21 0330 06/02/21 0452   06/01/21 2200  cefTRIAXone (ROCEPHIN) 1 g in sodium chloride 0.9 % 100  mL IVPB        1 g 200 mL/hr over 30 Minutes Intravenous  Once 06/01/21 2150 06/02/21 0039        Objective: Vitals:   07/14/21 0048 07/14/21 0618 07/14/21 0621 07/14/21 0817  BP:  (!) 157/134 129/64 133/77  Pulse: (!) 102 91 88 93  Resp:  18  17  Temp:  97.8 F (36.6 C)  (!) 97.1 F (36.2 C)  TempSrc:      SpO2: 92%   97%  Weight:      Height:        Intake/Output Summary (Last 24 hours) at 07/14/2021 1005 Last data filed at 07/13/2021 1823 Gross per 24 hour  Intake 240 ml  Output --  Net 240 ml    Filed Weights   06/16/21 1507 06/29/21 1215 07/10/21 1416  Weight: 63.6 kg 60.6 kg 65.5 kg    Examination: General exam: Appears comfortable  HEENT: PERRLA, oral mucosa moist, no sclera  icterus or thrush Respiratory system: Clear to auscultation. Respiratory effort normal. Cardiovascular system: S1 & S2 heard, regular rate and rhythm Gastrointestinal system: Abdomen soft, non-tender, nondistended. Normal bowel sounds   Central nervous system: Alert  - non verbal and does not follow commands Extremities: No cyanosis, clubbing or edema Skin: No rashes or ulcers Psychiatry:  difficult to assess    Data Reviewed: I have personally reviewed following labs and imaging studies  CBC: Recent Labs  Lab 07/12/21 0344  WBC 6.8  HGB 12.2  HCT 36.2  MCV 113.1*  PLT 286    Basic Metabolic Panel: Recent Labs  Lab 07/12/21 0344  NA 141  K 3.9  CL 109  CO2 25  GLUCOSE 84  BUN 26*  CREATININE 0.75  CALCIUM 9.0    GFR: Estimated Creatinine Clearance: 60.8 mL/min (by C-G formula based on SCr of 0.75 mg/dL). Liver Function Tests: No results for input(s): AST, ALT, ALKPHOS, BILITOT, PROT, ALBUMIN in the last 168 hours. No results for input(s): LIPASE, AMYLASE in the last 168 hours. No results for input(s): AMMONIA in the last 168 hours. Coagulation Profile: No results for input(s): INR, PROTIME in the last 168 hours. Cardiac Enzymes: No results for input(s): CKTOTAL, CKMB, CKMBINDEX, TROPONINI in the last 168 hours. BNP (last 3 results) No results for input(s): PROBNP in the last 8760 hours. HbA1C: No results for input(s): HGBA1C in the last 72 hours. CBG: No results for input(s): GLUCAP in the last 168 hours. Lipid Profile: No results for input(s): CHOL, HDL, LDLCALC, TRIG, CHOLHDL, LDLDIRECT in the last 72 hours. Thyroid Function Tests: No results for input(s): TSH, T4TOTAL, FREET4, T3FREE, THYROIDAB in the last 72 hours. Anemia Panel: No results for input(s): VITAMINB12, FOLATE, FERRITIN, TIBC, IRON, RETICCTPCT in the last 72 hours. Urine analysis:    Component Value Date/Time   COLORURINE YELLOW (A) 06/02/2021 0018   APPEARANCEUR HAZY (A) 06/02/2021 0018    APPEARANCEUR Cloudy (A) 10/26/2016 1137   LABSPEC 1.006 06/02/2021 0018   PHURINE 7.0 06/02/2021 0018   GLUCOSEU NEGATIVE 06/02/2021 0018   HGBUR NEGATIVE 06/02/2021 0018   BILIRUBINUR NEGATIVE 06/02/2021 0018   BILIRUBINUR 1+ 11/01/2017 1630   BILIRUBINUR Negative 10/26/2016 1137   KETONESUR 5 (A) 06/02/2021 0018   PROTEINUR NEGATIVE 06/02/2021 0018   UROBILINOGEN 0.2 11/01/2017 1630   NITRITE NEGATIVE 06/02/2021 0018   LEUKOCYTESUR NEGATIVE 06/02/2021 0018   Sepsis Labs: @LABRCNTIP (procalcitonin:4,lacticidven:4) )No results found for this or any previous visit (from the past 240 hour(s)).  Radiology Studies: No results found.    Scheduled Meds:  divalproex  500 mg Oral BID   enoxaparin (LOVENOX) injection  40 mg Subcutaneous Q24H   feeding supplement  237 mL Oral Q24H   melatonin  5 mg Oral QHS   pantoprazole  40 mg Oral Daily   polyethylene glycol  17 g Oral BID   senna-docusate  2 tablet Oral BID   ziprasidone  20 mg Oral BID WC   Continuous Infusions:   LOS: 41 days      Calvert Cantor, MD [facility] Pager: [email or web address] 07/14/2021, 10:05 AM

## 2021-07-14 NOTE — TOC Progression Note (Signed)
Transition of Care [facility]) - Progression Note    Patient Details  Name: Sabrina Johnston MRN: [id number] Date of Birth: 1956/06/27  Transition of [facility]) CM/SW Contact  Allayne Butcher, RN Phone Number: 07/14/2021, 11:04 AM  Clinical Narrative:    Joanne Chars with [facility] is actively searching for alternative care home placement.    Expected Discharge Plan: Group Home Barriers to Discharge: Requiring sitter/restraints, Unsafe home situation  Expected Discharge Plan and Services Expected Discharge Plan: Group Home   Discharge Planning Services: CM Consult   Living arrangements for the past 2 months: Group Home                 DME Arranged: N/A DME Agency: NA       HH Arranged: NA           Social Determinants of Health (SDOH) Interventions    Readmission Risk Interventions Readmission Risk Prevention Plan 06/06/2021  Post Dischage Appt Complete  Medication Screening Complete  Transportation Screening Complete  Some recent data might be hidden

## 2021-07-14 NOTE — Progress Notes (Signed)
Patient ambulated in room and hallway with 3 techs for at least 100 feet.  Patient with 2 assist and gait belt in place and followed by wheelchair.  Patient returned to bed in her room.  Will continue to monitor.

## 2021-07-15 MED ORDER — TRAZODONE HCL 50 MG PO TABS
50.0000 mg | ORAL_TABLET | Freq: Every day | ORAL | Status: DC
  Administered 2021-07-15: 50 mg via ORAL
  Filled 2021-07-15: qty 1

## 2021-07-15 NOTE — Progress Notes (Addendum)
PROGRESS NOTE    Sabrina Johnston   SAY:[id number]  DOB: 10-Jul-1956  DOA: 06/01/2021 PCP: Lauro Regulus, MD   Brief Narrative:  Sabrina Johnston is a 65 year old female with cognitive and behavioral dysfunction, seizure disorder and chronic constipation who presented back to the ED about 4 hours after she was discharged from the hospital to a group home.  She was sent back due to multiple falls and agitation.  She had recently been evaluated in the hospital and treated for E. coli sepsis with IV antibiotics. In the ED, lactic acid was noted to be 5.6.  She required sedation to get a CT scan which were found to be unremarkable.  She also required a bear hugger transiently for hypothermia. She has subsequently remained intermittently agitated and has required a sitter at the bedside.  She is unable to return to the group home and we have had difficulty finding a safe disposition for her.  Subjective: Nonverbal    Assessment & Plan:   Principal Problem:   Severe intellectual disability-unsafe to live alone and difficult to maintain at a group home due to behaviors - She has been evaluated by psychiatry and currently is receiving Geodon twice daily  -she continues to require a safety sitter due to unsafe behaviors and impulsivity - Of note the patient was able to ambulate earlier on in the hospital stay but has not been ambulating recently- - 9/28> I have had a discussion with PT to see if she is still able to get out of bed - 9/29- another discussion at today about assisting the patient out of bed - 9/30> I have discussed with charge RN and the Chiropodist that patient has not been allowed to get out of bed and advised her that she was ambulatory when she first was admitted and should be allowed to get out of bed multiple times a day - 10/1- patient ambulated yesterday- I have reminded the RN to allow her to ambulate and sit in the chair today  Active Problems: Insomnia -  she has only been sleeping 1-2 hrs a night- she has Trazodone ordered PRN- I will make this QHS routine and follow    Generalized epilepsy (HCC) -Continue Depakote    Bacteremia due to Gram-negative bacteria -Treated on recent admission   Time spent in minutes: 35 DVT prophylaxis: enoxaparin (LOVENOX) injection 40 mg Start: 06/07/21 2200 Place and maintain sequential compression device Start: 06/07/21 1434  Code Status: DO NOT RESUSCITATE Family Communication:  Level of Care: Level of care: Med-Surg Disposition Plan:  Status is: Inpatient  Remains inpatient appropriate because:Unsafe d/c plan  Dispo: The patient is from: Group home              Anticipated d/c is to:  To be determined              Patient currently is not medically stable to d/c.   Difficult to place patient Yes      Consultants:  Psychiatry Palliative care Neurology Procedures:   Antimicrobials:  Anti-infectives (From admission, onward)    Start     Dose/Rate Route Frequency Ordered Stop   06/04/21 1000  cephALEXin (KEFLEX) capsule 500 mg        500 mg Oral 4 times daily 06/04/21 0811 06/08/21 2329   06/02/21 1000  cefTRIAXone (ROCEPHIN) 2 g in sodium chloride 0.9 % 100 mL IVPB  Status:  Discontinued        2 g 200 mL/hr over  30 Minutes Intravenous Every 24 hours 06/02/21 0950 06/04/21 0810   06/02/21 0345  cefTRIAXone (ROCEPHIN) 1 g in sodium chloride 0.9 % 100 mL IVPB        1 g 200 mL/hr over 30 Minutes Intravenous  Once 06/02/21 0330 06/02/21 0452   06/01/21 2200  cefTRIAXone (ROCEPHIN) 1 g in sodium chloride 0.9 % 100 mL IVPB        1 g 200 mL/hr over 30 Minutes Intravenous  Once 06/01/21 2150 06/02/21 0039        Objective: Vitals:   07/14/21 2030 07/15/21 0045 07/15/21 0420 07/15/21 0824  BP: 105/66 (!) 101/57 104/60 106/61  Pulse: 86 96 91 92  Resp: 16 17 17 15   Temp: 97.6 F (36.4 C) 97.7 F (36.5 C) 97.9 F (36.6 C) 97.9 F (36.6 C)  TempSrc: Axillary Axillary Axillary  Axillary  SpO2: 98% 97% 96% 96%  Weight:    60.4 kg  Height:    5' (1.524 m)    Intake/Output Summary (Last 24 hours) at 07/15/2021 1204 Last data filed at 07/14/2021 1849 Gross per 24 hour  Intake 600 ml  Output --  Net 600 ml    Filed Weights   06/29/21 1215 07/10/21 1416 07/15/21 0824  Weight: 60.6 kg 65.5 kg 60.4 kg    Examination: General exam: Appears comfortable  HEENT: PERRLA, oral mucosa moist, no sclera icterus or thrush Respiratory system: Clear to auscultation. Respiratory effort normal. Cardiovascular system: S1 & S2 heard, regular rate and rhythm Gastrointestinal system: Abdomen soft, non-tender, nondistended. Normal bowel sounds   Central nervous system: Alert  - non verbal and does not follow commands Extremities: No cyanosis, clubbing or edema Skin: No rashes or ulcers Psychiatry:  difficult to assess    Data Reviewed: I have personally reviewed following labs and imaging studies  CBC: Recent Labs  Lab 07/12/21 0344  WBC 6.8  HGB 12.2  HCT 36.2  MCV 113.1*  PLT 286    Basic Metabolic Panel: Recent Labs  Lab 07/12/21 0344  NA 141  K 3.9  CL 109  CO2 25  GLUCOSE 84  BUN 26*  CREATININE 0.75  CALCIUM 9.0    GFR: Estimated Creatinine Clearance: 58.5 mL/min (by C-G formula based on SCr of 0.75 mg/dL). Liver Function Tests: No results for input(s): AST, ALT, ALKPHOS, BILITOT, PROT, ALBUMIN in the last 168 hours. No results for input(s): LIPASE, AMYLASE in the last 168 hours. No results for input(s): AMMONIA in the last 168 hours. Coagulation Profile: No results for input(s): INR, PROTIME in the last 168 hours. Cardiac Enzymes: No results for input(s): CKTOTAL, CKMB, CKMBINDEX, TROPONINI in the last 168 hours. BNP (last 3 results) No results for input(s): PROBNP in the last 8760 hours. HbA1C: No results for input(s): HGBA1C in the last 72 hours. CBG: No results for input(s): GLUCAP in the last 168 hours. Lipid Profile: No results for  input(s): CHOL, HDL, LDLCALC, TRIG, CHOLHDL, LDLDIRECT in the last 72 hours. Thyroid Function Tests: No results for input(s): TSH, T4TOTAL, FREET4, T3FREE, THYROIDAB in the last 72 hours. Anemia Panel: No results for input(s): VITAMINB12, FOLATE, FERRITIN, TIBC, IRON, RETICCTPCT in the last 72 hours. Urine analysis:    Component Value Date/Time   COLORURINE YELLOW (A) 06/02/2021 0018   APPEARANCEUR HAZY (A) 06/02/2021 0018   APPEARANCEUR Cloudy (A) 10/26/2016 1137   LABSPEC 1.006 06/02/2021 0018   PHURINE 7.0 06/02/2021 0018   GLUCOSEU NEGATIVE 06/02/2021 0018   HGBUR NEGATIVE 06/02/2021 0018  BILIRUBINUR NEGATIVE 06/02/2021 0018   BILIRUBINUR 1+ 11/01/2017 1630   BILIRUBINUR Negative 10/26/2016 1137   KETONESUR 5 (A) 06/02/2021 0018   PROTEINUR NEGATIVE 06/02/2021 0018   UROBILINOGEN 0.2 11/01/2017 1630   NITRITE NEGATIVE 06/02/2021 0018   LEUKOCYTESUR NEGATIVE 06/02/2021 0018   Sepsis Labs: @LABRCNTIP (procalcitonin:4,lacticidven:4) )No results found for this or any previous visit (from the past 240 hour(s)).       Radiology Studies: No results found.    Scheduled Meds:  divalproex  500 mg Oral BID   enoxaparin (LOVENOX) injection  40 mg Subcutaneous Q24H   feeding supplement  237 mL Oral Q24H   melatonin  5 mg Oral QHS   pantoprazole  40 mg Oral Daily   polyethylene glycol  17 g Oral BID   senna-docusate  2 tablet Oral BID   ziprasidone  20 mg Oral BID WC   Continuous Infusions:   LOS: 42 days      , MD [facility] Pager: [email or web address] 07/15/2021, 12:04 PM

## 2021-07-16 MED ORDER — TRAZODONE HCL 50 MG PO TABS
100.0000 mg | ORAL_TABLET | Freq: Every day | ORAL | Status: DC
  Administered 2021-07-16 – 2021-08-02 (×18): 100 mg via ORAL
  Filled 2021-07-16 (×18): qty 2

## 2021-07-16 NOTE — Progress Notes (Signed)
PROGRESS NOTE    Sabrina Johnston   JSH:[id number]  DOB: 04/02/1956  DOA: 06/01/2021 PCP: Lauro Regulus, MD   Brief Narrative:  Sabrina Johnston is a 65 year old female with cognitive and behavioral dysfunction, seizure disorder and chronic constipation who presented back to the ED about 4 hours after she was discharged from the hospital to a group home.  She was sent back due to multiple falls and agitation.  She had recently been evaluated in the hospital and treated for E. coli sepsis with IV antibiotics. In the ED, lactic acid was noted to be 5.6.  She required sedation to get a CT scan which were found to be unremarkable.  She also required a bear hugger transiently for hypothermia. She has subsequently remained intermittently agitated and has required a sitter at the bedside.  She is unable to return to the group home and we have had difficulty finding a safe disposition for her.  Subjective: Nonverbal- per staff, she did not sleep again today.    Assessment & Plan:   Principal Problem:   Severe intellectual disability-unsafe to live alone and difficult to maintain at a group home due to behaviors - She has been evaluated by psychiatry and currently is receiving Geodon twice daily  -she continues to require a safety sitter due to unsafe behaviors and impulsivity - Of note the patient was able to ambulate earlier on in the hospital stay but has not been ambulating recently- - 9/28> I have had a discussion with PT to see if she is still able to get out of bed - 9/29- another discussion at today about assisting the patient out of bed - 9/30> I have discussed with charge RN and the Chiropodist that patient has not been allowed to get out of bed and advised her that she was ambulatory when she first was admitted and should be allowed to get out of bed multiple times a day    Active Problems: Insomnia - I will increase her Trazodone tonight    Generalized epilepsy  (HCC) -Continue Depakote    Bacteremia due to Gram-negative bacteria -Treated on recent admission   Time spent in minutes: 35 DVT prophylaxis: enoxaparin (LOVENOX) injection 40 mg Start: 06/07/21 2200 Place and maintain sequential compression device Start: 06/07/21 1434  Code Status: DO NOT RESUSCITATE Family Communication:  Level of Care: Level of care: Med-Surg Disposition Plan:  Status is: Inpatient  Remains inpatient appropriate because:Unsafe d/c plan  Dispo: The patient is from: Group home              Anticipated d/c is to:  To be determined              Patient currently is not medically stable to d/c.   Difficult to place patient Yes   Consultants:  Psychiatry Palliative care Neurology Procedures:   Antimicrobials:  Anti-infectives (From admission, onward)    Start     Dose/Rate Route Frequency Ordered Stop   06/04/21 1000  cephALEXin (KEFLEX) capsule 500 mg        500 mg Oral 4 times daily 06/04/21 0811 06/08/21 2329   06/02/21 1000  cefTRIAXone (ROCEPHIN) 2 g in sodium chloride 0.9 % 100 mL IVPB  Status:  Discontinued        2 g 200 mL/hr over 30 Minutes Intravenous Every 24 hours 06/02/21 0950 06/04/21 0810   06/02/21 0345  cefTRIAXone (ROCEPHIN) 1 g in sodium chloride 0.9 % 100 mL IVPB  1 g 200 mL/hr over 30 Minutes Intravenous  Once 06/02/21 0330 06/02/21 0452   06/01/21 2200  cefTRIAXone (ROCEPHIN) 1 g in sodium chloride 0.9 % 100 mL IVPB        1 g 200 mL/hr over 30 Minutes Intravenous  Once 06/01/21 2150 06/02/21 0039        Objective: Vitals:   07/16/21 0028 07/16/21 0438 07/16/21 0807 07/16/21 1343  BP: (!) 125/56 126/61 103/62 (!) 125/43  Pulse: 96 89 93 83  Resp: 19 20 16 17   Temp: 98 F (36.7 C) 97.9 F (36.6 C) 97.6 F (36.4 C) 97.6 F (36.4 C)  TempSrc:      SpO2: 95% 96% 95% 91%  Weight:      Height:       No intake or output data in the 24 hours ending 07/16/21 1452  Filed Weights   06/29/21 1215 07/10/21 1416  07/15/21 0824  Weight: 60.6 kg 65.5 kg 60.4 kg    Examination: General exam: Appears comfortable  HEENT: PERRLA, oral mucosa moist, no sclera icterus or thrush Respiratory system: Clear to auscultation. Respiratory effort normal. Cardiovascular system: S1 & S2 heard, regular rate and rhythm Gastrointestinal system: Abdomen soft, non-tender, nondistended. Normal bowel sounds   Central nervous system: Alert   Extremities: No cyanosis, clubbing or edema Skin: No rashes or ulcers      Data Reviewed: I have personally reviewed following labs and imaging studies  CBC: Recent Labs  Lab 07/12/21 0344  WBC 6.8  HGB 12.2  HCT 36.2  MCV 113.1*  PLT 286    Basic Metabolic Panel: Recent Labs  Lab 07/12/21 0344  NA 141  K 3.9  CL 109  CO2 25  GLUCOSE 84  BUN 26*  CREATININE 0.75  CALCIUM 9.0    GFR: Estimated Creatinine Clearance: 58.5 mL/min (by C-G formula based on SCr of 0.75 mg/dL). Liver Function Tests: No results for input(s): AST, ALT, ALKPHOS, BILITOT, PROT, ALBUMIN in the last 168 hours. No results for input(s): LIPASE, AMYLASE in the last 168 hours. No results for input(s): AMMONIA in the last 168 hours. Coagulation Profile: No results for input(s): INR, PROTIME in the last 168 hours. Cardiac Enzymes: No results for input(s): CKTOTAL, CKMB, CKMBINDEX, TROPONINI in the last 168 hours. BNP (last 3 results) No results for input(s): PROBNP in the last 8760 hours. HbA1C: No results for input(s): HGBA1C in the last 72 hours. CBG: No results for input(s): GLUCAP in the last 168 hours. Lipid Profile: No results for input(s): CHOL, HDL, LDLCALC, TRIG, CHOLHDL, LDLDIRECT in the last 72 hours. Thyroid Function Tests: No results for input(s): TSH, T4TOTAL, FREET4, T3FREE, THYROIDAB in the last 72 hours. Anemia Panel: No results for input(s): VITAMINB12, FOLATE, FERRITIN, TIBC, IRON, RETICCTPCT in the last 72 hours. Urine analysis:    Component Value Date/Time    COLORURINE YELLOW (A) 06/02/2021 0018   APPEARANCEUR HAZY (A) 06/02/2021 0018   APPEARANCEUR Cloudy (A) 10/26/2016 1137   LABSPEC 1.006 06/02/2021 0018   PHURINE 7.0 06/02/2021 0018   GLUCOSEU NEGATIVE 06/02/2021 0018   HGBUR NEGATIVE 06/02/2021 0018   BILIRUBINUR NEGATIVE 06/02/2021 0018   BILIRUBINUR 1+ 11/01/2017 1630   BILIRUBINUR Negative 10/26/2016 1137   KETONESUR 5 (A) 06/02/2021 0018   PROTEINUR NEGATIVE 06/02/2021 0018   UROBILINOGEN 0.2 11/01/2017 1630   NITRITE NEGATIVE 06/02/2021 0018   LEUKOCYTESUR NEGATIVE 06/02/2021 0018   Sepsis Labs: @LABRCNTIP (procalcitonin:4,lacticidven:4) )No results found for this or any previous visit (from the past 240  hour(s)).       Radiology Studies: No results found.    Scheduled Meds:  divalproex  500 mg Oral BID   enoxaparin (LOVENOX) injection  40 mg Subcutaneous Q24H   feeding supplement  237 mL Oral Q24H   melatonin  5 mg Oral QHS   pantoprazole  40 mg Oral Daily   polyethylene glycol  17 g Oral BID   senna-docusate  2 tablet Oral BID   traZODone  50 mg Oral QHS   ziprasidone  20 mg Oral BID WC   Continuous Infusions:   LOS: 43 days      Calvert Cantor, MD [facility] Pager: [email or web address] 07/16/2021, 2:52 PM

## 2021-07-17 NOTE — Progress Notes (Signed)
PROGRESS NOTE    Sabrina Johnston   YKD:[id number]  DOB: Mar 01, 1956  DOA: 06/01/2021 PCP: Lauro Regulus, MD   Brief Narrative:  Sabrina Johnston is a 65 year old female with cognitive and behavioral dysfunction, seizure disorder and chronic constipation who presented back to the ED about 4 hours after she was discharged from the hospital to a group home.  She was sent back due to multiple falls and agitation.  She had recently been evaluated in the hospital and treated for E. coli sepsis with IV antibiotics. In the ED, lactic acid was noted to be 5.6.  She required sedation to get a CT scan which were found to be unremarkable.  She also required a bear hugger transiently for hypothermia. She has subsequently remained intermittently agitated and has required a sitter at the bedside.  She is unable to return to the group home and we have had difficulty finding a safe disposition for her.  Subjective: Non-verbal. Per staff, she did not sleep again last night.     Assessment & Plan:   Principal Problem:   Severe intellectual disability-unsafe to live alone and difficult to maintain at a group home due to behaviors - She has been evaluated by psychiatry and currently is receiving Geodon twice daily  -she continues to require a safety sitter due to unsafe behaviors and impulsivity - Of note the patient was able to ambulate earlier on in the hospital stay but has not been ambulating recently- - 9/28> I have had a discussion with PT to see if she is still able to get out of bed - 9/29- another discussion at today about assisting the patient out of bed - 9/30> I have discussed with charge RN and the Chiropodist that patient has not been allowed to get out of bed and advised her that she was ambulatory when she first was admitted and should be allowed to get out of bed multiple times a day    Active Problems: Insomnia - I have increased her Trazodone to 100 mg QHS, however, she is  still not sleeping.  - have asked nurses and techs to increase efforts to allow her to be out of bed mores of the day to allow her to get tired enough to sleep at night. I feel she should be spending at least 50 % of the day being out of the bed. Discussed with Emergency planning/management officer, Silva Bandy, on 1C.    Generalized epilepsy (HCC) -Continue Depakote    Bacteremia due to Gram-negative bacteria -Treated on recent admission   Time spent in minutes: 35 DVT prophylaxis: enoxaparin (LOVENOX) injection 40 mg Start: 06/07/21 2200 Place and maintain sequential compression device Start: 06/07/21 1434  Code Status: DO NOT RESUSCITATE Family Communication:  Level of Care: Level of care: Med-Surg Disposition Plan:  Status is: Inpatient  Remains inpatient appropriate because:Unsafe d/c plan  Dispo: The patient is from: Group home              Anticipated d/c is to:  To be determined              Patient currently is not medically stable to d/c.   Difficult to place patient Yes   Consultants:  Psychiatry Palliative care Neurology Procedures:   Antimicrobials:  Anti-infectives (From admission, onward)    Start     Dose/Rate Route Frequency Ordered Stop   06/04/21 1000  cephALEXin (KEFLEX) capsule 500 mg        500 mg Oral 4  times daily 06/04/21 0811 06/08/21 2329   06/02/21 1000  cefTRIAXone (ROCEPHIN) 2 g in sodium chloride 0.9 % 100 mL IVPB  Status:  Discontinued        2 g 200 mL/hr over 30 Minutes Intravenous Every 24 hours 06/02/21 0950 06/04/21 0810   06/02/21 0345  cefTRIAXone (ROCEPHIN) 1 g in sodium chloride 0.9 % 100 mL IVPB        1 g 200 mL/hr over 30 Minutes Intravenous  Once 06/02/21 0330 06/02/21 0452   06/01/21 2200  cefTRIAXone (ROCEPHIN) 1 g in sodium chloride 0.9 % 100 mL IVPB        1 g 200 mL/hr over 30 Minutes Intravenous  Once 06/01/21 2150 06/02/21 0039        Objective: Vitals:   07/17/21 0008 07/17/21 0411 07/17/21 0732 07/17/21 1118  BP: 100/60 122/79 111/67 (!)  129/94  Pulse: 93 93 84 88  Resp:   16 17  Temp:  97.8 F (36.6 C) (!) 97.1 F (36.2 C) (!) 97.3 F (36.3 C)  TempSrc:  Axillary Axillary Axillary  SpO2: 95% 97% 98% 97%  Weight:      Height:        Intake/Output Summary (Last 24 hours) at 07/17/2021 1428 Last data filed at 07/17/2021 0501 Gross per 24 hour  Intake 340 ml  Output --  Net 340 ml    Filed Weights   06/29/21 1215 07/10/21 1416 07/15/21 0824  Weight: 60.6 kg 65.5 kg 60.4 kg    Examination: General exam: Appears comfortable  HEENT: PERRLA, oral mucosa moist, no sclera icterus or thrush Respiratory system: Clear to auscultation. Respiratory effort normal. Cardiovascular system: S1 & S2 heard, regular rate and rhythm Gastrointestinal system: Abdomen soft, non-tender, nondistended. Normal bowel sounds   Central nervous system: Alert   Extremities: No cyanosis, clubbing or edema Skin: No rashes or ulcers      Data Reviewed: I have personally reviewed following labs and imaging studies  CBC: Recent Labs  Lab 07/12/21 0344  WBC 6.8  HGB 12.2  HCT 36.2  MCV 113.1*  PLT 286    Basic Metabolic Panel: Recent Labs  Lab 07/12/21 0344  NA 141  K 3.9  CL 109  CO2 25  GLUCOSE 84  BUN 26*  CREATININE 0.75  CALCIUM 9.0    GFR: Estimated Creatinine Clearance: 58.5 mL/min (by C-G formula based on SCr of 0.75 mg/dL). Liver Function Tests: No results for input(s): AST, ALT, ALKPHOS, BILITOT, PROT, ALBUMIN in the last 168 hours. No results for input(s): LIPASE, AMYLASE in the last 168 hours. No results for input(s): AMMONIA in the last 168 hours. Coagulation Profile: No results for input(s): INR, PROTIME in the last 168 hours. Cardiac Enzymes: No results for input(s): CKTOTAL, CKMB, CKMBINDEX, TROPONINI in the last 168 hours. BNP (last 3 results) No results for input(s): PROBNP in the last 8760 hours. HbA1C: No results for input(s): HGBA1C in the last 72 hours. CBG: No results for input(s): GLUCAP  in the last 168 hours. Lipid Profile: No results for input(s): CHOL, HDL, LDLCALC, TRIG, CHOLHDL, LDLDIRECT in the last 72 hours. Thyroid Function Tests: No results for input(s): TSH, T4TOTAL, FREET4, T3FREE, THYROIDAB in the last 72 hours. Anemia Panel: No results for input(s): VITAMINB12, FOLATE, FERRITIN, TIBC, IRON, RETICCTPCT in the last 72 hours. Urine analysis:    Component Value Date/Time   COLORURINE YELLOW (A) 06/02/2021 0018   APPEARANCEUR HAZY (A) 06/02/2021 0018   APPEARANCEUR Cloudy (A) 10/26/2016 1137  LABSPEC 1.006 06/02/2021 0018   PHURINE 7.0 06/02/2021 0018   GLUCOSEU NEGATIVE 06/02/2021 0018   HGBUR NEGATIVE 06/02/2021 0018   BILIRUBINUR NEGATIVE 06/02/2021 0018   BILIRUBINUR 1+ 11/01/2017 1630   BILIRUBINUR Negative 10/26/2016 1137   KETONESUR 5 (A) 06/02/2021 0018   PROTEINUR NEGATIVE 06/02/2021 0018   UROBILINOGEN 0.2 11/01/2017 1630   NITRITE NEGATIVE 06/02/2021 0018   LEUKOCYTESUR NEGATIVE 06/02/2021 0018   Sepsis Labs: @LABRCNTIP (procalcitonin:4,lacticidven:4) )No results found for this or any previous visit (from the past 240 hour(s)).       Radiology Studies: No results found.    Scheduled Meds:  divalproex  500 mg Oral BID   enoxaparin (LOVENOX) injection  40 mg Subcutaneous Q24H   feeding supplement  237 mL Oral Q24H   melatonin  5 mg Oral QHS   pantoprazole  40 mg Oral Daily   polyethylene glycol  17 g Oral BID   senna-docusate  2 tablet Oral BID   traZODone  100 mg Oral QHS   ziprasidone  20 mg Oral BID WC   Continuous Infusions:   LOS: 44 days      , MD [facility] Pager: [email or web address] 07/17/2021, 2:28 PM

## 2021-07-18 LAB — BASIC METABOLIC PANEL
Anion gap: 7 (ref 5–15)
BUN: 22 mg/dL (ref 8–23)
CO2: 25 mmol/L (ref 22–32)
Calcium: 8.8 mg/dL — ABNORMAL LOW (ref 8.9–10.3)
Chloride: 109 mmol/L (ref 98–111)
Creatinine, Ser: 0.83 mg/dL (ref 0.44–1.00)
GFR, Estimated: >60 mL/min (ref >60)
Glucose, Bld: 94 mg/dL (ref 70–99)
Potassium: 4.3 mmol/L (ref 3.5–5.1)
Sodium: 141 mmol/L (ref 135–145)

## 2021-07-18 MED ORDER — SODIUM CHLORIDE 0.9% FLUSH: 10.0000 mL | INTRAVENOUS | Status: DC | PRN

## 2021-07-18 MED ORDER — SODIUM CHLORIDE 0.9 % IV SOLN: INTRAVENOUS | Status: AC

## 2021-07-18 MED ORDER — SODIUM CHLORIDE 0.9 % IV BOLUS
500.0000 mL | Freq: Once | INTRAVENOUS | Status: AC
  Administered 2021-07-18: 14:00:00 500 mL via INTRAVENOUS

## 2021-07-18 MED ORDER — SODIUM CHLORIDE 0.9% FLUSH
10.0000 mL | Freq: Two times a day (BID) | INTRAVENOUS | Status: DC
Start: 2021-07-18 — End: 2021-08-07
  Administered 2021-07-19 – 2021-08-06 (×38): 10 mL

## 2021-07-18 NOTE — Progress Notes (Signed)
Attempted to ambulate patient with sitter tech. Patient required heavy 2+ assist to stand using the gait belt. Once standing the patient would not walk, she started leaning to the side. Tech and I assisted patient to the chair. Staff changed her bed linens, and assisted her back to bed.

## 2021-07-18 NOTE — Progress Notes (Signed)
Nutrition Follow-up  DOCUMENTATION CODES:  Not applicable  INTERVENTION:  Continue current diet as ordered, nursing staff to assist with meals Ensure Enlive po daily, each supplement provides 350 kcal and 20 grams of protein Weekly weights  NUTRITION DIAGNOSIS:  Inadequate oral intake related to  (altered mental status) as evidenced by  (routinely eating <75% of meals).  GOAL:  Patient will meet greater than or equal to 90% of their needs  MONITOR:  PO intake, Supplement acceptance  REASON FOR ASSESSMENT:  Consult Calorie Count  ASSESSMENT:  65 y.o. female presented to the ED with AMS and falls after being discharged back to her group home 4 hours prior to return. PMH of mental retardation, epilepsy, and chronic constipation. Pt nonverbal at baseline. Previous admission for sepsis due to UTI.  Pt remains medically stable at this time. Weight appears stable this admission.  Discussed in rounds, case management searching for discharge options, [facility] caseworker assisting.  Average Meal Intake: 8/18-9/2: 54% intake x 18 recorded meals (0-100%) 9/3-9/8: 46% intake x 10 recorded meals (0-100%) 9/9-9/15: 81% intake x 10 recorded meals (0-100%) 9/16-9/20: 75% intake x 7 recorded meals (50-100%) 9/21-9/27: 92% intake x 10 recorded meals (75-100%) 9/28-10/4: 42% intake x 10 recorded meals (0-100%)  Nutritionally Relevant Medications: Scheduled Meds:  feeding supplement  237 mL Oral Q24H   pantoprazole  40 mg Oral Daily   polyethylene glycol  17 g Oral BID   senna-docusate  2 tablet Oral BID   PRN Meds: bisacodyl, ondansetron  Labs Reviewed  NUTRITION - FOCUSED PHYSICAL EXAM: Flowsheet Row Most Recent Value  Orbital Region No depletion  Upper Arm Region No depletion  Thoracic and Lumbar Region No depletion  Buccal Region No depletion  Temple Region No depletion  Clavicle Bone Region No depletion  Clavicle and Acromion Bone Region No depletion  Scapular Bone Region  No depletion  Dorsal Hand No depletion  Patellar Region No depletion  Anterior Thigh Region No depletion  Posterior Calf Region No depletion  Edema (RD Assessment) None  Hair Reviewed  Eyes Unable to assess  Mouth Unable to assess  Skin Reviewed  Nails Reviewed   Diet Order:   Diet Order             DIET DYS 3 Room service appropriate? Yes; Fluid consistency: Thin  Diet effective now                   EDUCATION NEEDS:  No education needs have been identified at this time  Skin:  Skin Assessment: Reviewed RN Assessment  Last BM:  10/3 - type 6  Height:  Ht Readings from Last 1 Encounters:  07/15/21 5' (1.524 m)   Weight:  Wt Readings from Last 1 Encounters:  07/15/21 60.4 kg   Ideal Body Weight:  45.5 kg  BMI:  Body mass index is 26.01 kg/m.  Estimated Nutritional Needs:  Kcal:  1400-1600 kcal/d Protein:  70-80 g/d Fluid:  1.5-1.8 L/d   Greig Castilla, RD, LDN Clinical Dietitian Pager on Amion

## 2021-07-18 NOTE — Progress Notes (Signed)
PROGRESS NOTE    Sabrina Johnston   PNT:[id number]  DOB: 1956/04/25  DOA: 06/01/2021 PCP: Lauro Regulus, MD   Brief Narrative:  Sabrina Johnston is a 65 year old female with cognitive and behavioral dysfunction, seizure disorder and chronic constipation who presented back to the ED about 4 hours after she was discharged from the hospital to a group home.  She was sent back due to multiple falls and agitation.  She had recently been evaluated in the hospital and treated for E. coli sepsis with IV antibiotics. In the ED, lactic acid was noted to be 5.6.  She required sedation to get a CT scan which were found to be unremarkable.  She also required a bear hugger transiently for hypothermia. She has subsequently remained intermittently agitated and has required a sitter at the bedside.  She is unable to return to the group home and we have had difficulty finding a safe disposition for her.  Subjective: Non verbal- hypotensive today.     Assessment & Plan:   Principal Problem:   Severe intellectual disability-unsafe to live alone and difficult to maintain at a group home due to behaviors - She has been evaluated by psychiatry and currently is receiving Geodon twice daily  -she continues to require a safety sitter due to unsafe behaviors and impulsivity - Of note the patient was able to ambulate earlier on in the hospital stay but has not been ambulating recently-  - 9/28> I have had a discussion with PT to see if she is still able to get out of bed - 9/29- another discussion at today about assisting the patient out of bed - 9/30> I have discussed with charge RN and the Chiropodist that patient has not been allowed to get out of bed and advised her that she was ambulatory when she first was admitted and should be allowed to get out of bed multiple times a day - 10/3- have asked the nursing director to allow the patient to sit on mats on the floor for part of the day (this is her  baseline) rather than be confined to the bed all day as she has been     Active Problems: Hypotension due to dehydration - BP 89/67   given a 500 cc NS bolus today- will give continuous IVF @ 75 cc /her for 10 hrs  - need to follow oral intake carefully - have asked RN to closely follow I and O and encourage fluids  Insomnia-  - I have increased her Trazodone to 100 mg QHS, however, she is still not sleeping.  - have asked nurses and techs to increase efforts to allow her to be out of bed mores of the day to allow her to get tired enough to sleep at night. I feel she should be spending at least 50 % of the day being out of the bed. Discussed with Emergency planning/management officer, Sabrina Johnston, on 1C.    Generalized epilepsy (HCC) -Continue Depakote    Bacteremia due to Gram-negative bacteria -Treated on recent admission   Time spent in minutes: 35 DVT prophylaxis: enoxaparin (LOVENOX) injection 40 mg Start: 06/07/21 2200 Place and maintain sequential compression device Start: 06/07/21 1434  Code Status: DO NOT RESUSCITATE Family Communication:  Level of Care: Level of care: Med-Surg Disposition Plan:  Status is: Inpatient  Remains inpatient appropriate because:Unsafe d/c plan  Dispo: The patient is from: Group home              Anticipated  d/c is to:  To be determined              Patient currently is not medically stable to d/c.   Difficult to place patient Yes   Consultants:  Psychiatry Palliative care Neurology Procedures:   Antimicrobials:  Anti-infectives (From admission, onward)    Start     Dose/Rate Route Frequency Ordered Stop   06/04/21 1000  cephALEXin (KEFLEX) capsule 500 mg        500 mg Oral 4 times daily 06/04/21 0811 06/08/21 2329   06/02/21 1000  cefTRIAXone (ROCEPHIN) 2 g in sodium chloride 0.9 % 100 mL IVPB  Status:  Discontinued        2 g 200 mL/hr over 30 Minutes Intravenous Every 24 hours 06/02/21 0950 06/04/21 0810   06/02/21 0345  cefTRIAXone (ROCEPHIN) 1 g in  sodium chloride 0.9 % 100 mL IVPB        1 g 200 mL/hr over 30 Minutes Intravenous  Once 06/02/21 0330 06/02/21 0452   06/01/21 2200  cefTRIAXone (ROCEPHIN) 1 g in sodium chloride 0.9 % 100 mL IVPB        1 g 200 mL/hr over 30 Minutes Intravenous  Once 06/01/21 2150 06/02/21 0039        Objective: Vitals:   07/18/21 0847 07/18/21 1156 07/18/21 1223 07/18/21 1558  BP: 114/65 (!) 89/60 (!) 89/67 (!) 124/96  Pulse: 86 97 73 96  Resp: 16 16  19   Temp: 97.6 F (36.4 C) 97.9 F (36.6 C)  97.7 F (36.5 C)  TempSrc: Axillary   Axillary  SpO2: 95% 94%  97%  Weight:      Height:        Intake/Output Summary (Last 24 hours) at 07/18/2021 1636 Last data filed at 07/18/2021 1550 Gross per 24 hour  Intake 1340 ml  Output --  Net 1340 ml    Filed Weights   06/29/21 1215 07/10/21 1416 07/15/21 0824  Weight: 60.6 kg 65.5 kg 60.4 kg    Examination: General exam: Appears comfortable  HEENT: PERRLA, oral mucosa moist, no sclera icterus or thrush Respiratory system: Clear to auscultation. Respiratory effort normal. Cardiovascular system: S1 & S2 heard, regular rate and rhythm Gastrointestinal system: Abdomen soft, non-tender, nondistended. Normal bowel sounds   Central nervous system: Alert and oriented. No focal neurological deficits. Extremities: No cyanosis, clubbing or edema Skin: No rashes or ulcers Psychiatry:  Mood & affect appropriate.    Data Reviewed: I have personally reviewed following labs and imaging studies  CBC: Recent Labs  Lab 07/12/21 0344  WBC 6.8  HGB 12.2  HCT 36.2  MCV 113.1*  PLT 286    Basic Metabolic Panel: Recent Labs  Lab 07/12/21 0344  NA 141  K 3.9  CL 109  CO2 25  GLUCOSE 84  BUN 26*  CREATININE 0.75  CALCIUM 9.0    GFR: Estimated Creatinine Clearance: 58.5 mL/min (by C-G formula based on SCr of 0.75 mg/dL). Liver Function Tests: No results for input(s): AST, ALT, ALKPHOS, BILITOT, PROT, ALBUMIN in the last 168 hours. No  results for input(s): LIPASE, AMYLASE in the last 168 hours. No results for input(s): AMMONIA in the last 168 hours. Coagulation Profile: No results for input(s): INR, PROTIME in the last 168 hours. Cardiac Enzymes: No results for input(s): CKTOTAL, CKMB, CKMBINDEX, TROPONINI in the last 168 hours. BNP (last 3 results) No results for input(s): PROBNP in the last 8760 hours. HbA1C: No results for input(s): HGBA1C in the  last 72 hours. CBG: No results for input(s): GLUCAP in the last 168 hours. Lipid Profile: No results for input(s): CHOL, HDL, LDLCALC, TRIG, CHOLHDL, LDLDIRECT in the last 72 hours. Thyroid Function Tests: No results for input(s): TSH, T4TOTAL, FREET4, T3FREE, THYROIDAB in the last 72 hours. Anemia Panel: No results for input(s): VITAMINB12, FOLATE, FERRITIN, TIBC, IRON, RETICCTPCT in the last 72 hours. Urine analysis:    Component Value Date/Time   COLORURINE YELLOW (A) 06/02/2021 0018   APPEARANCEUR HAZY (A) 06/02/2021 0018   APPEARANCEUR Cloudy (A) 10/26/2016 1137   LABSPEC 1.006 06/02/2021 0018   PHURINE 7.0 06/02/2021 0018   GLUCOSEU NEGATIVE 06/02/2021 0018   HGBUR NEGATIVE 06/02/2021 0018   BILIRUBINUR NEGATIVE 06/02/2021 0018   BILIRUBINUR 1+ 11/01/2017 1630   BILIRUBINUR Negative 10/26/2016 1137   KETONESUR 5 (A) 06/02/2021 0018   PROTEINUR NEGATIVE 06/02/2021 0018   UROBILINOGEN 0.2 11/01/2017 1630   NITRITE NEGATIVE 06/02/2021 0018   LEUKOCYTESUR NEGATIVE 06/02/2021 0018   Sepsis Labs: @LABRCNTIP (procalcitonin:4,lacticidven:4) )No results found for this or any previous visit (from the past 240 hour(s)).       Radiology Studies: No results found.    Scheduled Meds:  divalproex  500 mg Oral BID   enoxaparin (LOVENOX) injection  40 mg Subcutaneous Q24H   feeding supplement  237 mL Oral Q24H   melatonin  5 mg Oral QHS   pantoprazole  40 mg Oral Daily   polyethylene glycol  17 g Oral BID   senna-docusate  2 tablet Oral BID   traZODone   100 mg Oral QHS   ziprasidone  20 mg Oral BID WC   Continuous Infusions:   LOS: 45 days      , MD [facility] Pager: [email or web address] 07/18/2021, 4:36 PM

## 2021-07-19 DIAGNOSIS — G4709 Other insomnia: Secondary | ICD-10-CM

## 2021-07-19 LAB — BASIC METABOLIC PANEL
Anion gap: 8 (ref 5–15)
BUN: 19 mg/dL (ref 8–23)
CO2: 24 mmol/L (ref 22–32)
Calcium: 8.9 mg/dL (ref 8.9–10.3)
Chloride: 109 mmol/L (ref 98–111)
Creatinine, Ser: 0.76 mg/dL (ref 0.44–1.00)
GFR, Estimated: >60 mL/min (ref >60)
Glucose, Bld: 91 mg/dL (ref 70–99)
Potassium: 3.7 mmol/L (ref 3.5–5.1)
Sodium: 141 mmol/L (ref 135–145)

## 2021-07-19 LAB — CBC
HCT: 36.6 % (ref 36.0–46.0)
Hemoglobin: 12.3 g/dL (ref 12.0–15.0)
MCH: 37.7 pg — ABNORMAL HIGH (ref 26.0–34.0)
MCHC: 33.6 g/dL (ref 30.0–36.0)
MCV: 112.3 fL — ABNORMAL HIGH (ref 80.0–100.0)
Platelets: 313 10*3/uL (ref 150–400)
RBC: 3.26 MIL/uL — ABNORMAL LOW (ref 3.87–5.11)
RDW: 13.3 % (ref 11.5–15.5)
WBC: 6.2 10*3/uL (ref 4.0–10.5)
nRBC: 0 % (ref 0.0–0.2)

## 2021-07-19 NOTE — Progress Notes (Signed)
PROGRESS NOTE    Sabrina Johnston  BPZ:[id number] DOB: 02/21/1956 DOA: 06/01/2021 PCP: Lauro Regulus, MD    Assessment & Plan:   Principal Problem:   Severe intellectual disability Active Problems:   Generalized epilepsy (HCC)   Bacteremia due to Gram-negative bacteria   Abnormal TSH   Pressure ulcer with abrasion, blister, partial thickness skin loss involving epidermis and/or dermis (HCC)  Severe intellectual disability: unsafe to live alone and difficult to maintain at a group home due to behaviors.   Hypotension: secondary to dehydration. Resolved    Insomnia: continue on trazodone   Generalized epilepsy: continue depakote    Bacteremia: secondary to gram-negative bacteria. Completed treatment    DVT prophylaxis: lovenox  Code Status: DNR Family Communication:  Disposition Plan: unclear  Level of care: Med-Surg  Status is: Inpatient  Remains inpatient appropriate because:Unsafe d/c plan  Dispo: The patient is from: Group home              Anticipated d/c is to:  unclear              Patient currently is medically stable to d/c.   Difficult to place patient Yes    Consultants:    Procedures:   Antimicrobials:    Subjective: Pt is unable to verbalize her complaints   Objective: Vitals:   07/18/21 1558 07/18/21 1951 07/18/21 2333 07/19/21 0632  BP: (!) 124/96 107/87 115/64 (!) 132/56  Pulse: 96 90 86 84  Resp: 19 18 16 18   Temp: 97.7 F (36.5 C) 98.2 F (36.8 C) (!) 96.9 F (36.1 C) 97.6 F (36.4 C)  TempSrc: Axillary Axillary Axillary   SpO2: 97% 90% 96% 97%  Weight:      Height:        Intake/Output Summary (Last 24 hours) at 07/19/2021 0731 Last data filed at 07/19/2021 0317 Gross per 24 hour  Intake 1773.85 ml  Output --  Net 1773.85 ml   Filed Weights   06/29/21 1215 07/10/21 1416 07/15/21 0824  Weight: 60.6 kg 65.5 kg 60.4 kg    Examination:  General exam: Appears calm and comfortable  Respiratory system: Clear to  auscultation. Respiratory effort normal. Cardiovascular system: S1 & S2 +. No rubs, gallops or clicks. Gastrointestinal system: Abdomen is nondistended, soft and nontender. Normal bowel sounds heard. Central nervous system: Alert and awake. Moves all extremities  Psychiatry: Judgement and insight are poor. Flat mood and affect     Data Reviewed: I have personally reviewed following labs and imaging studies  CBC: Recent Labs  Lab 07/19/21 0445  WBC 6.2  HGB 12.3  HCT 36.6  MCV 112.3*  PLT 313   Basic Metabolic Panel: Recent Labs  Lab 07/18/21 1704 07/19/21 0445  NA 141 141  K 4.3 3.7  CL 109 109  CO2 25 24  GLUCOSE 94 91  BUN 22 19  CREATININE 0.83 0.76  CALCIUM 8.8* 8.9   GFR: Estimated Creatinine Clearance: 58.5 mL/min (by C-G formula based on SCr of 0.76 mg/dL). Liver Function Tests: No results for input(s): AST, ALT, ALKPHOS, BILITOT, PROT, ALBUMIN in the last 168 hours. No results for input(s): LIPASE, AMYLASE in the last 168 hours. No results for input(s): AMMONIA in the last 168 hours. Coagulation Profile: No results for input(s): INR, PROTIME in the last 168 hours. Cardiac Enzymes: No results for input(s): CKTOTAL, CKMB, CKMBINDEX, TROPONINI in the last 168 hours. BNP (last 3 results) No results for input(s): PROBNP in the last 8760 hours. HbA1C:  No results for input(s): HGBA1C in the last 72 hours. CBG: No results for input(s): GLUCAP in the last 168 hours. Lipid Profile: No results for input(s): CHOL, HDL, LDLCALC, TRIG, CHOLHDL, LDLDIRECT in the last 72 hours. Thyroid Function Tests: No results for input(s): TSH, T4TOTAL, FREET4, T3FREE, THYROIDAB in the last 72 hours. Anemia Panel: No results for input(s): VITAMINB12, FOLATE, FERRITIN, TIBC, IRON, RETICCTPCT in the last 72 hours. Sepsis Labs: No results for input(s): PROCALCITON, LATICACIDVEN in the last 168 hours.  No results found for this or any previous visit (from the past 240 hour(s)).        Radiology Studies: No results found.      Scheduled Meds:  divalproex  500 mg Oral BID   enoxaparin (LOVENOX) injection  40 mg Subcutaneous Q24H   feeding supplement  237 mL Oral Q24H   melatonin  5 mg Oral QHS   pantoprazole  40 mg Oral Daily   polyethylene glycol  17 g Oral BID   senna-docusate  2 tablet Oral BID   sodium chloride flush  10-40 mL Intracatheter Q12H   traZODone  100 mg Oral QHS   ziprasidone  20 mg Oral BID WC   Continuous Infusions:   LOS: 46 days    Time spent: 15 mins    Charise Killian, MD [facility] Pager [phone]  If 7PM-7AM, please contact night-coverage 07/19/2021, 7:31 AM

## 2021-07-20 NOTE — Plan of Care (Signed)
  Problem: Education: Goal: Knowledge of General Education information will improve Description: Including pain rating scale, medication(s)/side effects and non-pharmacologic comfort measures Outcome: Progressing   Problem: Health Behavior/Discharge Planning: Goal: Ability to manage health-related needs will improve Outcome: Progressing   Problem: Activity: Goal: Risk for activity intolerance will decrease Outcome: Progressing   Problem: Nutrition: Goal: Adequate nutrition will be maintained Outcome: Progressing   Problem: Elimination: Goal: Will not experience complications related to urinary retention Outcome: Progressing   Problem: Pain Managment: Goal: General experience of comfort will improve Outcome: Progressing   Problem: Safety: Goal: Ability to remain free from injury will improve Outcome: Progressing   Problem: Skin Integrity: Goal: Risk for impaired skin integrity will decrease Outcome: Progressing   

## 2021-07-20 NOTE — TOC Progression Note (Signed)
Transition of Care [facility]) - Progression Note    Patient Details  Name: Sabrina Johnston MRN: [id number] Date of Birth: [birth year]  [facility] of [facility]) CM/SW Contact  Allayne Butcher, RN Phone Number: 07/20/2021, 9:07 AM  Clinical Narrative:    Per Joanne Chars at [facility] patient is on the wait list for respite care and second on the list.     Expected Discharge Plan: Group Home Barriers to Discharge: Requiring sitter/restraints, Unsafe home situation  Expected Discharge Plan and Services Expected Discharge Plan: Group Home   Discharge Planning Services: CM Consult   Living arrangements for the past 2 months: Group Home                 DME Arranged: N/A DME Agency: NA       HH Arranged: NA           Social Determinants of Health (SDOH) Interventions    Readmission Risk Interventions Readmission Risk Prevention Plan 06/06/2021  Post Dischage Appt Complete  Medication Screening Complete  Transportation Screening Complete  Some recent data might be hidden

## 2021-07-20 NOTE — Progress Notes (Signed)
PROGRESS NOTE    Sabrina Johnston  QIH:[id number] DOB: 03-01-1956 DOA: 06/01/2021 PCP: Lauro Regulus, MD    Assessment & Plan:   Principal Problem:   Severe intellectual disability Active Problems:   Generalized epilepsy (HCC)   Bacteremia due to Gram-negative bacteria   Abnormal TSH   Pressure ulcer with abrasion, blister, partial thickness skin loss involving epidermis and/or dermis (HCC)  Severe intellectual disability: unsafe to live alone and difficult to maintain at a group home due to behaviors    Hypotension: secondary to dehydration. Resolved    Insomnia: continue on trazodone   Generalized epilepsy: continue on depakote    Bacteremia: secondary to gram-negative bacteria. Completed treatment    DVT prophylaxis: lovenox  Code Status: DNR Family Communication:  Disposition Plan: unclear  Level of care: Med-Surg  Status is: Inpatient  Remains inpatient appropriate because:Unsafe d/c plan  Dispo: The patient is from: Group home              Anticipated d/c is to:  unclear              Patient currently is medically stable to d/c.   Difficult to place patient Yes    Consultants:    Procedures:   Antimicrobials:    Subjective: Pt is nonverbal at baseline   Objective: Vitals:   07/19/21 1516 07/19/21 2039 07/20/21 0052 07/20/21 0502  BP: 98/61 (!) 111/58 (!) 99/36 119/61  Pulse: 77 84 70 78  Resp: 18 18 15 14   Temp: 97.9 F (36.6 C) 97.6 F (36.4 C) (!) 97.5 F (36.4 C) (!) 96.2 F (35.7 C)  TempSrc: Axillary Skin Skin   SpO2: 92% 96% 98% 100%  Weight:      Height:        Intake/Output Summary (Last 24 hours) at 07/20/2021 0718 Last data filed at 07/19/2021 1836 Gross per 24 hour  Intake 240 ml  Output --  Net 240 ml   Filed Weights   06/29/21 1215 07/10/21 1416 07/15/21 0824  Weight: 60.6 kg 65.5 kg 60.4 kg    Examination:  General exam: Appears comfortable  Respiratory system: clear breath sounds b/l  Cardiovascular  system: S1/S2+. No rubs or clicks  Gastrointestinal system: Abd is soft, NT, ND & hypoactive bowel sounds  Central nervous system: Alert and oriented. Moves all extremities  Psychiatry: Judgement and insight are poor. Flat mood and affect     Data Reviewed: I have personally reviewed following labs and imaging studies  CBC: Recent Labs  Lab 07/19/21 0445  WBC 6.2  HGB 12.3  HCT 36.6  MCV 112.3*  PLT 313   Basic Metabolic Panel: Recent Labs  Lab 07/18/21 1704 07/19/21 0445  NA 141 141  K 4.3 3.7  CL 109 109  CO2 25 24  GLUCOSE 94 91  BUN 22 19  CREATININE 0.83 0.76  CALCIUM 8.8* 8.9   GFR: Estimated Creatinine Clearance: 58.5 mL/min (by C-G formula based on SCr of 0.76 mg/dL). Liver Function Tests: No results for input(s): AST, ALT, ALKPHOS, BILITOT, PROT, ALBUMIN in the last 168 hours. No results for input(s): LIPASE, AMYLASE in the last 168 hours. No results for input(s): AMMONIA in the last 168 hours. Coagulation Profile: No results for input(s): INR, PROTIME in the last 168 hours. Cardiac Enzymes: No results for input(s): CKTOTAL, CKMB, CKMBINDEX, TROPONINI in the last 168 hours. BNP (last 3 results) No results for input(s): PROBNP in the last 8760 hours. HbA1C: No results for input(s): HGBA1C  in the last 72 hours. CBG: No results for input(s): GLUCAP in the last 168 hours. Lipid Profile: No results for input(s): CHOL, HDL, LDLCALC, TRIG, CHOLHDL, LDLDIRECT in the last 72 hours. Thyroid Function Tests: No results for input(s): TSH, T4TOTAL, FREET4, T3FREE, THYROIDAB in the last 72 hours. Anemia Panel: No results for input(s): VITAMINB12, FOLATE, FERRITIN, TIBC, IRON, RETICCTPCT in the last 72 hours. Sepsis Labs: No results for input(s): PROCALCITON, LATICACIDVEN in the last 168 hours.  No results found for this or any previous visit (from the past 240 hour(s)).       Radiology Studies: No results found.      Scheduled Meds:  divalproex  500  mg Oral BID   enoxaparin (LOVENOX) injection  40 mg Subcutaneous Q24H   feeding supplement  237 mL Oral Q24H   melatonin  5 mg Oral QHS   pantoprazole  40 mg Oral Daily   polyethylene glycol  17 g Oral BID   senna-docusate  2 tablet Oral BID   sodium chloride flush  10-40 mL Intracatheter Q12H   traZODone  100 mg Oral QHS   ziprasidone  20 mg Oral BID WC   Continuous Infusions:   LOS: 47 days    Time spent: 15 mins    Charise Killian, MD [facility] Pager [phone]  If 7PM-7AM, please contact night-coverage 07/20/2021, 7:18 AM

## 2021-07-21 NOTE — TOC Progression Note (Signed)
Transition of Care [facility]) - Progression Note    Patient Details  Name: Sabrina Johnston MRN: [id number] Date of Birth: Apr 04, 1956  Transition [facility] Care [facility]) CM/SW Contact  Allayne Butcher, RN Phone Number: 07/21/2021, 2:28 PM  Clinical Narrative:    Joanne Chars with [employer] requested FL2.  Updated FL2 sent to [name] via secure email.    Expected Discharge Plan: Group Home Barriers to Discharge: Requiring sitter/restraints, Unsafe home situation  Expected Discharge Plan and Services Expected Discharge Plan: Group Home   Discharge Planning Services: CM Consult   Living arrangements for the past 2 months: Group Home                 DME Arranged: N/A DME Agency: NA       HH Arranged: NA           Social Determinants of Health (SDOH) Interventions    Readmission Risk Interventions Readmission Risk Prevention Plan 06/06/2021  Post Dischage Appt Complete  Medication Screening Complete  Transportation Screening Complete  Some recent data might be hidden

## 2021-07-21 NOTE — [place] FL2 (Signed)
[employer] MEDICAID FL2 LEVEL OF CARE SCREENING TOOL     IDENTIFICATION  Patient Name: Sabrina Johnston Birthdate: 01/22/1956 Sex: female Admission Date (Current Location): 06/01/2021  [facility] and [place] Number:  Chiropodist and Address:  [facility], [address] [place], [place] [address]      Provider Number: [id number]  Attending Physician Name and Address:  Charise Killian, MD  Relative Name and Phone Number:  Coble,Lisa (Legal Guardian) (Sister)   [phone] (Mobile)    Current Level of Care: Other (Comment) (Specialized family Care Home/Respite Care) Recommended Level of Care: Skilled Nursing Facility Prior Approval Number:    Date Approved/Denied:   PASRR Number: pending  Discharge Plan: Other (Comment) (Specialized family care home/group home)    Current Diagnoses: Patient Active Problem List   Diagnosis Date Noted   Pressure ulcer with abrasion, blister, partial thickness skin loss involving epidermis and/or dermis (HCC) 06/05/2021   Abnormal TSH 06/03/2021   Bacteremia due to Gram-negative bacteria    Seizure disorder (HCC)    Dehydration 05/27/2021   Altered mental status    Hypotension    Loose stools 10/25/2016   Menopause 10/25/2016   Chronic constipation 12/10/2014   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   Severe intellectual disability 12/10/2014    Orientation RESPIRATION BLADDER Height & Weight     Self  Normal Incontinent Weight: 60.4 kg Height:  5' (152.4 cm)  BEHAVIORAL SYMPTOMS/MOOD NEUROLOGICAL BOWEL NUTRITION STATUS    Convulsions/Seizures Incontinent  (Dysphagia 3, thin liquids, finger foods)  AMBULATORY STATUS COMMUNICATION OF NEEDS Skin   Limited Assist Non-Verbally Normal                       Personal Care Assistance Level of Assistance  Bathing, Feeding, Dressing Bathing Assistance: Maximum assistance Feeding assistance: Limited assistance Dressing Assistance: Maximum  assistance     Functional Limitations Info  Sight, Hearing, Speech Sight Info: Impaired Hearing Info:  (mute) Speech Info: Impaired ([place])    SPECIAL CARE FACTORS FREQUENCY                       Contractures Contractures Info: Not present    Additional Factors Info  Allergies, Code Status Code Status Info: DNR Allergies Info: No Known Allergies           Current Medications (07/21/2021):  This is the current hospital active medication list Current Facility-Administered Medications  Medication Dose Route Frequency Provider Last Rate Last Admin   acetaminophen (TYLENOL) tablet 650 mg  650 mg Oral Q6H PRN Agbata, Tochukwu, MD   650 mg at 07/20/21 2218   Or   acetaminophen (TYLENOL) suppository 650 mg  650 mg Rectal Q6H PRN Agbata, Tochukwu, MD   650 mg at 06/02/21 1527   bisacodyl (DULCOLAX) suppository 10 mg  10 mg Rectal Daily PRN Pokhrel, Laxman, MD       divalproex (DEPAKOTE SPRINKLE) capsule 500 mg  500 mg Oral BID Gillis Santa, MD   500 mg at 07/21/21 0805   enoxaparin (LOVENOX) injection 40 mg  40 mg Subcutaneous Q24H Charise Killian, MD   40 mg at 07/20/21 2219   feeding supplement (ENSURE ENLIVE / ENSURE PLUS) liquid 237 mL  237 mL Oral Q24H Pokhrel, Laxman, MD   237 mL at 07/21/21 0805   hydrALAZINE (APRESOLINE) injection 10 mg  10 mg Intravenous Q6H PRN Burnadette Pop, MD       hydrocortisone cream 1 %  1 application  1 application Topical TID PRN Pokhrel, Laxman, MD   1 application at 07/09/21 2048   ibuprofen (ADVIL) 100 MG/5ML suspension 600 mg  600 mg Oral Q6H PRN Pokhrel, Laxman, MD   600 mg at 07/19/21 0339   melatonin tablet 5 mg  5 mg Oral QHS Gillis Santa, MD   5 mg at 07/20/21 2218   ondansetron (ZOFRAN) tablet 4 mg  4 mg Oral Q6H PRN Agbata, Tochukwu, MD       Or   ondansetron (ZOFRAN) injection 4 mg  4 mg Intravenous Q6H PRN Agbata, Tochukwu, MD       pantoprazole (PROTONIX) EC tablet 40 mg  40 mg Oral Daily Pokhrel, Laxman, MD   40 mg at  07/21/21 0805   polyethylene glycol (MIRALAX / GLYCOLAX) packet 17 g  17 g Oral BID Pokhrel, Laxman, MD   17 g at 07/21/21 0805   senna-docusate (Senokot-S) tablet 2 tablet  2 tablet Oral BID Alberteen Sam, MD   2 tablet at 07/21/21 0804   sodium chloride flush (NS) 0.9 % injection 10-40 mL  10-40 mL Intracatheter Q12H Rizwan, Ladell Heads, MD   10 mL at 07/21/21 0805   sodium chloride flush (NS) 0.9 % injection 10-40 mL  10-40 mL Intracatheter PRN Calvert Cantor, MD       traZODone (DESYREL) tablet 100 mg  100 mg Oral QHS Rizwan, Ladell Heads, MD   100 mg at 07/20/21 2218   ziprasidone (GEODON) capsule 20 mg  20 mg Oral BID WC Charise Killian, MD   20 mg at 07/21/21 0805   ziprasidone (GEODON) injection 10 mg  10 mg Intramuscular Q6H PRN Mansy, Jan A, MD   10 mg at 07/19/21 2321     Discharge Medications: Please see discharge summary for a list of discharge medications.  Relevant Imaging Results:  Relevant Lab Results:   Additional Information SSN [id number]  Allayne Butcher, RN

## 2021-07-21 NOTE — Plan of Care (Signed)
Patient alert no acute respiratory distress.Patient ambulated with NA and RN at the beginning of the shift in room.Patient had  supervised play time on the floor with toys,floor mats.Patient tolerated same well.Meds with chocolate pudding.Vital signs done and  recorded.Patient rested well over the shift.Medicated with prn tylenol at bedtime.Safety sitter at bedside ,bead alarm in place Problem: Safety: Goal: Ability to remain free from injury will improve Outcome: Not Applicable   Problem: Activity: Goal: Risk for activity intolerance will decrease Outcome: Not Applicable

## 2021-07-21 NOTE — Progress Notes (Signed)
PROGRESS NOTE    Sabrina Johnston  PFX:[id number] DOB: [birth year] DOA: 06/01/2021 PCP: Lauro Regulus, MD    Assessment & Plan:   Principal Problem:   Severe intellectual disability Active Problems:   Generalized epilepsy (HCC)   Bacteremia due to Gram-negative bacteria   Abnormal TSH   Pressure ulcer with abrasion, blister, partial thickness skin loss involving epidermis and/or dermis (HCC)  Severe intellectual disability: unsafe to live alone and difficult to maintain at a group home due to behaviors    Hypotension: resolved    Insomnia: continue on trazodone   Generalized epilepsy: continue on depakote    Bacteremia: secondary to gram-negative bacteria. Completed treatment    DVT prophylaxis: lovenox  Code Status: DNR Family Communication:  Disposition Plan: unclear. Unsafe d/c plane   Level of care: Med-Surg  Status is: Inpatient  Remains inpatient appropriate because:Unsafe d/c plan  Dispo: The patient is from: Group home              Anticipated d/c is to:  unclear              Patient currently is medically stable to d/c.   Difficult to place patient Yes    Consultants:    Procedures:   Antimicrobials:    Subjective: Pt seems comfortable and nonverbal at baseline   Objective: Vitals:   07/20/21 1653 07/20/21 1919 07/21/21 0101 07/21/21 0423  BP:  118/66 114/77 120/63  Pulse:  91 86 93  Resp:  17 18 19   Temp:  (!) 97.4 F (36.3 C) 97.7 F (36.5 C) 97.8 F (36.6 C)  TempSrc:      SpO2: 94% 96% 98% 97%  Weight:      Height:        Intake/Output Summary (Last 24 hours) at 07/21/2021 0734 Last data filed at 07/20/2021 1840 Gross per 24 hour  Intake 610 ml  Output --  Net 610 ml   Filed Weights   06/29/21 1215 07/10/21 1416 07/15/21 0824  Weight: 60.6 kg 65.5 kg 60.4 kg    Examination:  General exam: Appears calm & comfortable  Respiratory system: clear breath sounds b/l  Cardiovascular system: S1 & S2+. No rubs or clicks   Gastrointestinal system: Abd is soft, NT, ND & normal bowel sounds  Central nervous system: Alert and awake. Moves all extremities  Psychiatry: Judgement and insight are poor. Flat mood and affect     Data Reviewed: I have personally reviewed following labs and imaging studies  CBC: Recent Labs  Lab 07/19/21 0445  WBC 6.2  HGB 12.3  HCT 36.6  MCV 112.3*  PLT 313   Basic Metabolic Panel: Recent Labs  Lab 07/18/21 1704 07/19/21 0445  NA 141 141  K 4.3 3.7  CL 109 109  CO2 25 24  GLUCOSE 94 91  BUN 22 19  CREATININE 0.83 0.76  CALCIUM 8.8* 8.9   GFR: Estimated Creatinine Clearance: 58.5 mL/min (by C-G formula based on SCr of 0.76 mg/dL). Liver Function Tests: No results for input(s): AST, ALT, ALKPHOS, BILITOT, PROT, ALBUMIN in the last 168 hours. No results for input(s): LIPASE, AMYLASE in the last 168 hours. No results for input(s): AMMONIA in the last 168 hours. Coagulation Profile: No results for input(s): INR, PROTIME in the last 168 hours. Cardiac Enzymes: No results for input(s): CKTOTAL, CKMB, CKMBINDEX, TROPONINI in the last 168 hours. BNP (last 3 results) No results for input(s): PROBNP in the last 8760 hours. HbA1C: No results for input(s):  HGBA1C in the last 72 hours. CBG: No results for input(s): GLUCAP in the last 168 hours. Lipid Profile: No results for input(s): CHOL, HDL, LDLCALC, TRIG, CHOLHDL, LDLDIRECT in the last 72 hours. Thyroid Function Tests: No results for input(s): TSH, T4TOTAL, FREET4, T3FREE, THYROIDAB in the last 72 hours. Anemia Panel: No results for input(s): VITAMINB12, FOLATE, FERRITIN, TIBC, IRON, RETICCTPCT in the last 72 hours. Sepsis Labs: No results for input(s): PROCALCITON, LATICACIDVEN in the last 168 hours.  No results found for this or any previous visit (from the past 240 hour(s)).       Radiology Studies: No results found.      Scheduled Meds:  divalproex  500 mg Oral BID   enoxaparin (LOVENOX)  injection  40 mg Subcutaneous Q24H   feeding supplement  237 mL Oral Q24H   melatonin  5 mg Oral QHS   pantoprazole  40 mg Oral Daily   polyethylene glycol  17 g Oral BID   senna-docusate  2 tablet Oral BID   sodium chloride flush  10-40 mL Intracatheter Q12H   traZODone  100 mg Oral QHS   ziprasidone  20 mg Oral BID WC   Continuous Infusions:   LOS: 48 days    Time spent: 15 mins    Charise Killian, MD [facility] Pager [phone]  If 7PM-7AM, please contact night-coverage 07/21/2021, 7:34 AM

## 2021-07-22 NOTE — Progress Notes (Signed)
PROGRESS NOTE    Sabrina Johnston  ZOX:[id number] DOB: 1955-11-04 DOA: 06/01/2021 PCP: Lauro Regulus, MD    Assessment & Plan:   Principal Problem:   Severe intellectual disability Active Problems:   Generalized epilepsy (HCC)   Bacteremia due to Gram-negative bacteria   Abnormal TSH   Pressure ulcer with abrasion, blister, partial thickness skin loss involving epidermis and/or dermis (HCC)  Severe intellectual disability: unsafe to live alone and difficult to maintain at a group home due to behaviors    Hypotension: resolved    Insomnia: continue on trazodone.  Generalized epilepsy: continue on depakote    Bacteremia: secondary to gram-negative bacteria. Completed treatment    DVT prophylaxis: lovenox  Code Status: DNR Family Communication:  Disposition Plan: unclear. Unsafe d/c plane   Level of care: Med-Surg  Status is: Inpatient  Remains inpatient appropriate because:Unsafe d/c plan  Dispo: The patient is from: Group home              Anticipated d/c is to:  unclear              Patient currently is medically stable to d/c.   Difficult to place patient Yes    Consultants:    Procedures:   Antimicrobials:    Subjective: Pt is nonverbal at baseline    Objective: Vitals:   07/21/21 1238 07/21/21 2051 07/21/21 2054 07/22/21 0533  BP: (!) 121/33  (!) 96/59 (!) 103/59  Pulse: 81 91 85 84  Resp: 18  18 18   Temp: 98.4 F (36.9 C)  97.8 F (36.6 C) 98.4 F (36.9 C)  TempSrc:   Oral Oral  SpO2: 99% 97% 98% 97%  Weight:   63 kg   Height:        Intake/Output Summary (Last 24 hours) at 07/22/2021 0739 Last data filed at 07/22/2021 0612 Gross per 24 hour  Intake 240 ml  Output 3 ml  Net 237 ml   Filed Weights   07/10/21 1416 07/15/21 0824 07/21/21 2054  Weight: 65.5 kg 60.4 kg 63 kg    Examination:  General exam: Appears comfortable  Respiratory system: clear breath sounds b/l  Cardiovascular system: S1 & S2+. No rubs or clicks   Gastrointestinal system: Abd is soft, NT, ND & normal bowel sounds  Central nervous system: Alert and awake. Moves all extremities  Psychiatry: Judgement and insight are poor. Flat mood and affect     Data Reviewed: I have personally reviewed following labs and imaging studies  CBC: Recent Labs  Lab 07/19/21 0445  WBC 6.2  HGB 12.3  HCT 36.6  MCV 112.3*  PLT 313   Basic Metabolic Panel: Recent Labs  Lab 07/18/21 1704 07/19/21 0445  NA 141 141  K 4.3 3.7  CL 109 109  CO2 25 24  GLUCOSE 94 91  BUN 22 19  CREATININE 0.83 0.76  CALCIUM 8.8* 8.9   GFR: Estimated Creatinine Clearance: 59.7 mL/min (by C-G formula based on SCr of 0.76 mg/dL). Liver Function Tests: No results for input(s): AST, ALT, ALKPHOS, BILITOT, PROT, ALBUMIN in the last 168 hours. No results for input(s): LIPASE, AMYLASE in the last 168 hours. No results for input(s): AMMONIA in the last 168 hours. Coagulation Profile: No results for input(s): INR, PROTIME in the last 168 hours. Cardiac Enzymes: No results for input(s): CKTOTAL, CKMB, CKMBINDEX, TROPONINI in the last 168 hours. BNP (last 3 results) No results for input(s): PROBNP in the last 8760 hours. HbA1C: No results for input(s):  HGBA1C in the last 72 hours. CBG: No results for input(s): GLUCAP in the last 168 hours. Lipid Profile: No results for input(s): CHOL, HDL, LDLCALC, TRIG, CHOLHDL, LDLDIRECT in the last 72 hours. Thyroid Function Tests: No results for input(s): TSH, T4TOTAL, FREET4, T3FREE, THYROIDAB in the last 72 hours. Anemia Panel: No results for input(s): VITAMINB12, FOLATE, FERRITIN, TIBC, IRON, RETICCTPCT in the last 72 hours. Sepsis Labs: No results for input(s): PROCALCITON, LATICACIDVEN in the last 168 hours.  No results found for this or any previous visit (from the past 240 hour(s)).       Radiology Studies: No results found.      Scheduled Meds:  divalproex  500 mg Oral BID   enoxaparin (LOVENOX)  injection  40 mg Subcutaneous Q24H   feeding supplement  237 mL Oral Q24H   melatonin  5 mg Oral QHS   pantoprazole  40 mg Oral Daily   polyethylene glycol  17 g Oral BID   senna-docusate  2 tablet Oral BID   sodium chloride flush  10-40 mL Intracatheter Q12H   traZODone  100 mg Oral QHS   ziprasidone  20 mg Oral BID WC   Continuous Infusions:   LOS: 49 days    Time spent: 10 mins    Charise Killian, MD [facility] Pager [phone]  If 7PM-7AM, please contact night-coverage 07/22/2021, 7:39 AM

## 2021-07-23 NOTE — Progress Notes (Signed)
Pt agitated and refused am vitals this morning

## 2021-07-23 NOTE — Progress Notes (Signed)
PROGRESS NOTE    NETA UPADHYAY  [id number]:[id number] DOB: 04-Nov-1955 DOA: 06/01/2021 PCP: Lauro Regulus, MD    Assessment & Plan:   Principal Problem:   Severe intellectual disability Active Problems:   Generalized epilepsy (HCC)   Bacteremia due to Gram-negative bacteria   Abnormal TSH   Pressure ulcer with abrasion, blister, partial thickness skin loss involving epidermis and/or dermis (HCC)  Severe intellectual disability: unsafe to live alone and difficult to maintain at a group home due to behaviors. Intermittent agitation    Hypotension: resolved    Insomnia: continue on trazdone .  Generalized epilepsy: continue on depakote    Bacteremia: secondary to gram-negative bacteria. Completed treatment    DVT prophylaxis: lovenox  Code Status: DNR Family Communication:  Disposition Plan: unclear. Unsafe d/c plane   Level of care: Med-Surg  Status is: Inpatient  Remains inpatient appropriate because:Unsafe d/c plan  Dispo: The patient is from: Group home              Anticipated d/c is to:  unclear              Patient currently is medically stable to d/c.   Difficult to place patient Yes    Consultants:    Procedures:   Antimicrobials:    Subjective: Pt is nonverbal at baseline   Objective: Vitals:   07/22/21 0533 07/22/21 1235 07/22/21 2106 07/23/21 0605  BP: (!) 103/59 (!) 99/57 105/60 (!) 137/58  Pulse: 84 72 75 86  Resp: 18 18 16 17   Temp: 98.4 F (36.9 C) 98 F (36.7 C)  97.8 F (36.6 C)  TempSrc: Oral Axillary  Oral  SpO2: 97% 95% 94% 98%  Weight:      Height:       No intake or output data in the 24 hours ending 07/23/21 0720  Filed Weights   07/10/21 1416 07/15/21 0824 07/21/21 2054  Weight: 65.5 kg 60.4 kg 63 kg    Examination:  General exam: Appears agitated  Respiratory system: clear breath sounds b/l  Cardiovascular system: S1 & S2+. No rubs or clicks  Gastrointestinal system: Abd is soft, NT, ND & normal bowel sounds   Central nervous system: Alert and awake. Moves all extremities  Psychiatry: Judgement and insight are poor. Flat mood and affect     Data Reviewed: I have personally reviewed following labs and imaging studies  CBC: Recent Labs  Lab 07/19/21 0445  WBC 6.2  HGB 12.3  HCT 36.6  MCV 112.3*  PLT 313   Basic Metabolic Panel: Recent Labs  Lab 07/18/21 1704 07/19/21 0445  NA 141 141  K 4.3 3.7  CL 109 109  CO2 25 24  GLUCOSE 94 91  BUN 22 19  CREATININE 0.83 0.76  CALCIUM 8.8* 8.9   GFR: Estimated Creatinine Clearance: 59.7 mL/min (by C-G formula based on SCr of 0.76 mg/dL). Liver Function Tests: No results for input(s): AST, ALT, ALKPHOS, BILITOT, PROT, ALBUMIN in the last 168 hours. No results for input(s): LIPASE, AMYLASE in the last 168 hours. No results for input(s): AMMONIA in the last 168 hours. Coagulation Profile: No results for input(s): INR, PROTIME in the last 168 hours. Cardiac Enzymes: No results for input(s): CKTOTAL, CKMB, CKMBINDEX, TROPONINI in the last 168 hours. BNP (last 3 results) No results for input(s): PROBNP in the last 8760 hours. HbA1C: No results for input(s): HGBA1C in the last 72 hours. CBG: No results for input(s): GLUCAP in the last 168 hours. Lipid Profile:  No results for input(s): CHOL, HDL, LDLCALC, TRIG, CHOLHDL, LDLDIRECT in the last 72 hours. Thyroid Function Tests: No results for input(s): TSH, T4TOTAL, FREET4, T3FREE, THYROIDAB in the last 72 hours. Anemia Panel: No results for input(s): VITAMINB12, FOLATE, FERRITIN, TIBC, IRON, RETICCTPCT in the last 72 hours. Sepsis Labs: No results for input(s): PROCALCITON, LATICACIDVEN in the last 168 hours.  No results found for this or any previous visit (from the past 240 hour(s)).       Radiology Studies: No results found.      Scheduled Meds:  divalproex  500 mg Oral BID   enoxaparin (LOVENOX) injection  40 mg Subcutaneous Q24H   feeding supplement  237 mL Oral Q24H    melatonin  5 mg Oral QHS   pantoprazole  40 mg Oral Daily   polyethylene glycol  17 g Oral BID   senna-docusate  2 tablet Oral BID   sodium chloride flush  10-40 mL Intracatheter Q12H   traZODone  100 mg Oral QHS   ziprasidone  20 mg Oral BID WC   Continuous Infusions:   LOS: 50 days    Time spent: 10 mins    Charise Killian, MD [facility] Pager [phone]  If 7PM-7AM, please contact night-coverage 07/23/2021, 7:20 AM

## 2021-07-24 NOTE — Progress Notes (Signed)
PROGRESS NOTE    Sabrina Johnston  [id number] DOB: 02/10/1956 DOA: 06/01/2021 PCP: Lauro Regulus, MD    Assessment & Plan:   Principal Problem:   Severe intellectual disability Active Problems:   Generalized epilepsy (HCC)   Bacteremia due to Gram-negative bacteria   Abnormal TSH   Pressure ulcer with abrasion, blister, partial thickness skin loss involving epidermis and/or dermis (HCC)  Severe intellectual disability: unsafe to live alone and difficult to maintain at a group home due to behaviors. Intermittent agitation    Hypotension: resolved    Insomnia: continue on trazodone   Generalized epilepsy: continue on depakote    Bacteremia: secondary to gram-negative bacteria. Completed treatment    DVT prophylaxis: lovenox  Code Status: DNR Family Communication:  Disposition Plan: unclear. Unsafe d/c plane   Level of care: Med-Surg  Status is: Inpatient  Remains inpatient appropriate because:Unsafe d/c plan  Dispo: The patient is from: Group home              Anticipated d/c is to:  unclear              Patient currently is medically stable to d/c.   Difficult to place patient Yes    Consultants:    Procedures:   Antimicrobials:    Subjective: Pt appears restless  Objective: Vitals:   07/23/21 1652 07/23/21 1958 07/24/21 0132 07/24/21 0414  BP: 109/63 (!) 114/58 (!) 136/108 (!) 101/56  Pulse: 99 96 92 90  Resp: 18 19 16 17   Temp: 98.4 F (36.9 C) 98.1 F (36.7 C) 97.6 F (36.4 C) 97.7 F (36.5 C)  TempSrc: Axillary     SpO2: 98% 96% 98% 98%  Weight:      Height:        Intake/Output Summary (Last 24 hours) at 07/24/2021 0724 Last data filed at 07/23/2021 1344 Gross per 24 hour  Intake 120 ml  Output --  Net 120 ml    Filed Weights   07/10/21 1416 07/15/21 0824 07/21/21 2054  Weight: 65.5 kg 60.4 kg 63 kg    Examination:  General exam: Appears restless Respiratory system: clear breath sounds b/l. No rales Cardiovascular  system: S1/S2+. No rubs or clicks  Gastrointestinal system: Abd is soft, NT, ND & normal bowel sounds  Central nervous system: Alert and awake. Moves all extremities  Psychiatry: Judgement and insight are poor. Flat mood and affect     Data Reviewed: I have personally reviewed following labs and imaging studies  CBC: Recent Labs  Lab 07/19/21 0445  WBC 6.2  HGB 12.3  HCT 36.6  MCV 112.3*  PLT 313   Basic Metabolic Panel: Recent Labs  Lab 07/18/21 1704 07/19/21 0445  NA 141 141  K 4.3 3.7  CL 109 109  CO2 25 24  GLUCOSE 94 91  BUN 22 19  CREATININE 0.83 0.76  CALCIUM 8.8* 8.9   GFR: Estimated Creatinine Clearance: 59.7 mL/min (by C-G formula based on SCr of 0.76 mg/dL). Liver Function Tests: No results for input(s): AST, ALT, ALKPHOS, BILITOT, PROT, ALBUMIN in the last 168 hours. No results for input(s): LIPASE, AMYLASE in the last 168 hours. No results for input(s): AMMONIA in the last 168 hours. Coagulation Profile: No results for input(s): INR, PROTIME in the last 168 hours. Cardiac Enzymes: No results for input(s): CKTOTAL, CKMB, CKMBINDEX, TROPONINI in the last 168 hours. BNP (last 3 results) No results for input(s): PROBNP in the last 8760 hours. HbA1C: No results for input(s): HGBA1C  in the last 72 hours. CBG: No results for input(s): GLUCAP in the last 168 hours. Lipid Profile: No results for input(s): CHOL, HDL, LDLCALC, TRIG, CHOLHDL, LDLDIRECT in the last 72 hours. Thyroid Function Tests: No results for input(s): TSH, T4TOTAL, FREET4, T3FREE, THYROIDAB in the last 72 hours. Anemia Panel: No results for input(s): VITAMINB12, FOLATE, FERRITIN, TIBC, IRON, RETICCTPCT in the last 72 hours. Sepsis Labs: No results for input(s): PROCALCITON, LATICACIDVEN in the last 168 hours.  No results found for this or any previous visit (from the past 240 hour(s)).       Radiology Studies: No results found.      Scheduled Meds:  divalproex  500 mg Oral  BID   enoxaparin (LOVENOX) injection  40 mg Subcutaneous Q24H   feeding supplement  237 mL Oral Q24H   melatonin  5 mg Oral QHS   pantoprazole  40 mg Oral Daily   polyethylene glycol  17 g Oral BID   senna-docusate  2 tablet Oral BID   sodium chloride flush  10-40 mL Intracatheter Q12H   traZODone  100 mg Oral QHS   ziprasidone  20 mg Oral BID WC   Continuous Infusions:   LOS: 51 days    Time spent: 10 mins    Charise Killian, MD [facility] Pager [phone]  If 7PM-7AM, please contact night-coverage 07/24/2021, 7:24 AM

## 2021-07-25 NOTE — Progress Notes (Signed)
PROGRESS NOTE   HPI was taken from Dr. Joylene Igo: Sabrina Johnston is a 65 y.o. female with medical history significant for cognitive and neurobehavioral dysfunction, seizure disorder and chronic constipation, who presented to the emergency room 3 hours after her discharge for evaluation of multiple falls and agitation.   She was hospitalized for 6 days and treated for E. coli sepsis with IV antibiotics. 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 She was seen in consultation by neurology and had an EEG which did not show any seizure activity even the patient required 3 mg of IV Ativan for agitation prior to getting the EEG done. Patient was sent back to the emergency room for evaluation from the group home because of aggressive behavior and agitation.  She has sustained 3 falls and struck her head but there was no loss of consciousness or seizure-like activity. Labs in the emergency room showed a lactic acid of 5.6 which has since normalized. I am unable to do a review of systems on this patient since she is sedated and nonverbal. Labs show sodium 143, potassium 4.0, chloride 110, bicarb 23, glucose 83, BUN less than 5, creatinine 0.79, calcium 8.2, alkaline phosphatase 142, albumin 2.0, AST 74, ALT 32, total protein 5.2, ammonia 44, total CK2 52, lactic acid 5. >> 0.9, white count 9.6, hemoglobin 11.5, hematocrit 32.5, MCV 104.5, RDW 14.7, platelet count 236, PT 13.9, INR 1.1, TSH 9.09 Respiratory viral panel is negative Chest x-ray reviewed by me shows low lung volumes with no active disease. CT scan of the head without contrast/maxillofacial CT/CT scan of cervical spine shows markedly enlarged lateral ventricles with colpocephaly configuration, likely congenital.  Small frontal scalp hematoma without  skull fracture.  No facial fracture.  No acute fracture or static subluxation of the cervical spine. Twelve-lead EKG reviewed by me shows normal sinus rhythm with nonspecific ST changes.     ED Course: Patient is a 65 year old female with a history of cognitive and neurobehavioral dysfunction who resides in a group home and was discharged from the hospital on 06/01/21 after hospitalization for E. coli sepsis and metabolic encephalopathy. Patient returned to the emergency room 3 hours after her discharge for evaluation of multiple witnessed falls as well as aggressive behavior towards the staff. Imaging shows a right frontal hematoma and her initial lactic acid level was elevated at 5.6. She required sedation to get a CT scan of her head, cervical spine and maxillofacial CT to rule out any injuries and remains sedated. She was on a Lawyer transiently for hypothermia which has resolved. She will be referred to observation status for further evaluation.  As per Dr. Butler Denmark: Sabrina Vassell Kimbrois is a 65 year old female with cognitive and behavioral dysfunction, seizure disorder and chronic constipation who presented back to the ED about 4 hours after she was discharged from the hospital to a group home.  She was sent back due to multiple falls and agitation.  She had recently been evaluated in the hospital and treated for E. coli sepsis with IV antibiotics. In the ED, lactic acid was noted to be 5.6.  She required sedation to get a CT scan which were found to be unremarkable.  She also required a bear hugger transiently for hypothermia. She has subsequently remained intermittently agitated and has required a sitter at the  bedside.  She is unable to return to the group home and we have had difficulty finding a safe disposition for her.   Hospital course from Dr. Mayford Knife 10/5-10/11/22: Pt is stable and has intermittent agitation. Pt continues to require a sitter 24-7. CM has not found a safe d/c plan for  pt.     Sabrina Johnston  JKD:[id number] DOB: November 07, 1955 DOA: 06/01/2021 PCP: Lauro Regulus, MD    Assessment & Plan:   Principal Problem:   Severe intellectual disability Active Problems:   Generalized epilepsy (HCC)   Bacteremia due to Gram-negative bacteria   Abnormal TSH   Pressure ulcer with abrasion, blister, partial thickness skin loss involving epidermis and/or dermis (HCC)  Severe intellectual disability: unsafe to live alone and difficult to maintain at a group home due to behaviors. Intermittent agitation. Continue w/ sitter    Hypotension: resolved    Insomnia: continue on trazodone   Generalized epilepsy: continue on depakote    Bacteremia: secondary to gram-negative bacteria. Completed treatment    DVT prophylaxis: lovenox  Code Status: DNR Family Communication:  Disposition Plan: unclear. Unsafe d/c plane   Level of care: Med-Surg  Status is: Inpatient  Remains inpatient appropriate because:Unsafe d/c plan  Dispo: The patient is from: Group home              Anticipated d/c is to:  unclear              Patient currently is medically stable to d/c.   Difficult to place patient Yes    Consultants:    Procedures:   Antimicrobials:    Subjective: Pt appears comfortable   Objective: Vitals:   07/24/21 1129 07/24/21 1731 07/24/21 1918 07/25/21 0410  BP: 104/67 101/83 120/64 116/62  Pulse: 90 (!) 101 100 91  Resp: 15 14 16 15   Temp: 97.7 F (36.5 C) 98.1 F (36.7 C) 97.7 F (36.5 C) 97.7 F (36.5 C)  TempSrc:      SpO2: 100% 100% 93% 96%  Weight:      Height:        Intake/Output Summary (Last 24 hours) at 07/25/2021 0725 Last data filed at 07/24/2021 0946 Gross per 24 hour  Intake 0 ml  Output --  Net 0 ml    Filed Weights   07/10/21 1416 07/15/21 0824 07/21/21 2054  Weight: 65.5 kg 60.4 kg 63 kg    Examination:  General exam: Appears comfortable  Respiratory system: clear breath sounds b/l. No  wheezes Cardiovascular system:S1 & S2+. No rubs or clicks  Gastrointestinal system: Abd is soft, NT, ND & normal bowel sounds  Central nervous system: Alert and awake. Moves all extremities  Psychiatry: Judgement and insight appear poor. Flat  mood and affect      Data Reviewed: I have personally reviewed following labs and imaging studies  CBC: Recent Labs  Lab 07/19/21 0445  WBC 6.2  HGB 12.3  HCT 36.6  MCV 112.3*  PLT 313   Basic Metabolic Panel: Recent Labs  Lab 07/18/21 1704 07/19/21 0445  NA 141 141  K 4.3 3.7  CL 109 109  CO2 25 24  GLUCOSE 94 91  BUN 22 19  CREATININE 0.83 0.76  CALCIUM 8.8* 8.9   GFR: Estimated Creatinine Clearance: 59.7 mL/min (by C-G formula based on SCr of 0.76 mg/dL). Liver Function Tests: No results for input(s): AST, ALT, ALKPHOS, BILITOT, PROT, ALBUMIN in the last 168 hours. No results for input(s): LIPASE, AMYLASE in the last  168 hours. No results for input(s): AMMONIA in the last 168 hours. Coagulation Profile: No results for input(s): INR, PROTIME in the last 168 hours. Cardiac Enzymes: No results for input(s): CKTOTAL, CKMB, CKMBINDEX, TROPONINI in the last 168 hours. BNP (last 3 results) No results for input(s): PROBNP in the last 8760 hours. HbA1C: No results for input(s): HGBA1C in the last 72 hours. CBG: No results for input(s): GLUCAP in the last 168 hours. Lipid Profile: No results for input(s): CHOL, HDL, LDLCALC, TRIG, CHOLHDL, LDLDIRECT in the last 72 hours. Thyroid Function Tests: No results for input(s): TSH, T4TOTAL, FREET4, T3FREE, THYROIDAB in the last 72 hours. Anemia Panel: No results for input(s): VITAMINB12, FOLATE, FERRITIN, TIBC, IRON, RETICCTPCT in the last 72 hours. Sepsis Labs: No results for input(s): PROCALCITON, LATICACIDVEN in the last 168 hours.  No results found for this or any previous visit (from the past 240 hour(s)).       Radiology Studies: No results found.      Scheduled  Meds:  divalproex  500 mg Oral BID   enoxaparin (LOVENOX) injection  40 mg Subcutaneous Q24H   feeding supplement  237 mL Oral Q24H   melatonin  5 mg Oral QHS   pantoprazole  40 mg Oral Daily   polyethylene glycol  17 g Oral BID   senna-docusate  2 tablet Oral BID   sodium chloride flush  10-40 mL Intracatheter Q12H   traZODone  100 mg Oral QHS   ziprasidone  20 mg Oral BID WC   Continuous Infusions:   LOS: 52 days    Time spent: 10 mins    Charise Killian, MD [facility] Pager [phone]  If 7PM-7AM, please contact night-coverage 07/25/2021, 7:25 AM

## 2021-07-25 NOTE — Care Management Important Message (Signed)
Important Message  Patient Details  Name: Sabrina Johnston MRN: [id number] Date of Birth: [birth year]   Medicare Important Message Given:  Yes ((late documentation IM given on 10 10 22))     Bernadette Hoit 07/25/2021, 10:48 AM

## 2021-07-25 NOTE — TOC Progression Note (Signed)
Transition of Care [facility]) - Progression Note    Patient Details  Name: Sabrina Johnston MRN: [id number] Date of Birth: Mar 07, 1956  [facility] of [facility]) CM/SW Contact  Allayne Butcher, RN Phone Number: 07/25/2021, 2:30 PM  Clinical Narrative:    Sabrina Johnston with [facility] continues to work on placement.     Expected Discharge Plan: Group Home Barriers to Discharge: Requiring sitter/restraints, Unsafe home situation  Expected Discharge Plan and Services Expected Discharge Plan: Group Home   Discharge Planning Services: CM Consult   Living arrangements for the past 2 months: Group Home                 DME Arranged: N/A DME Agency: NA       HH Arranged: NA           Social Determinants of Health (SDOH) Interventions    Readmission Risk Interventions Readmission Risk Prevention Plan 06/06/2021  Post Dischage Appt Complete  Medication Screening Complete  Transportation Screening Complete  Some recent data might be hidden

## 2021-07-25 NOTE — Progress Notes (Signed)
Nutrition Follow-up  DOCUMENTATION CODES:  Not applicable  INTERVENTION:  Continue current diet as ordered, nursing staff to assist with meals Ensure Enlive po daily, each supplement provides 350 kcal and 20 grams of protein Weekly weights  NUTRITION DIAGNOSIS:  Inadequate oral intake related to  (altered mental status) as evidenced by  (routinely eating <75% of meals).  GOAL:  Patient will meet greater than or equal to 90% of their needs  MONITOR:  PO intake, Supplement acceptance  REASON FOR ASSESSMENT:  Consult Calorie Count  ASSESSMENT:  65 y.o. female presented to the ED with AMS and falls after being discharged back to her group home 4 hours prior to return. PMH of mental retardation, epilepsy, and chronic constipation. Pt nonverbal at baseline. Previous admission for sepsis due to UTI.  Pt remains medically stable at this time. Weight appears stable this admission, good intake of meals over the last week. CM continues to work with [facility] for appropriate placement for pt.  Average Meal Intake: 8/18-9/2: 54% intake x 18 recorded meals (0-100%) 9/3-9/8: 46% intake x 10 recorded meals (0-100%) 9/9-9/15: 81% intake x 10 recorded meals (0-100%) 9/16-9/20: 75% intake x 7 recorded meals (50-100%) 9/21-9/27: 92% intake x 10 recorded meals (75-100%) 9/28-10/4: 42% intake x 10 recorded meals (0-100%) 10/5-10/11: 82% intake x 7 recorded meals (50-100%)  Nutritionally Relevant Medications: Scheduled Meds:  feeding supplement  237 mL Oral Q24H   pantoprazole  40 mg Oral Daily   polyethylene glycol  17 g Oral BID   senna-docusate  2 tablet Oral BID   PRN Meds: bisacodyl, ondansetron  Labs Reviewed  NUTRITION - FOCUSED PHYSICAL EXAM: Flowsheet Row Most Recent Value  Orbital Region No depletion  Upper Arm Region No depletion  Thoracic and Lumbar Region No depletion  Buccal Region No depletion  Temple Region No depletion  Clavicle Bone Region No depletion  Clavicle and  Acromion Bone Region No depletion  Scapular Bone Region No depletion  Dorsal Hand No depletion  Patellar Region No depletion  Anterior Thigh Region No depletion  Posterior Calf Region No depletion  Edema (RD Assessment) None  Hair Reviewed  Eyes Unable to assess  Mouth Unable to assess  Skin Reviewed  Nails Reviewed   Diet Order:   Diet Order             DIET DYS 3 Room service appropriate? Yes; Fluid consistency: Thin  Diet effective now                   EDUCATION NEEDS:  No education needs have been identified at this time  Skin:  Skin Assessment: Reviewed RN Assessment  Last BM:  10/9 - type 6  Height:  Ht Readings from Last 1 Encounters:  07/15/21 5' (1.524 m)   Weight:  Wt Readings from Last 1 Encounters:  07/21/21 63 kg   Ideal Body Weight:  45.5 kg  BMI:  Body mass index is 27.13 kg/m.  Estimated Nutritional Needs:  Kcal:  1400-1600 kcal/d Protein:  70-80 g/d Fluid:  1.5-1.8 L/d   Greig Castilla, RD, LDN Clinical Dietitian Pager on Amion

## 2021-07-26 DIAGNOSIS — I959 Hypotension, unspecified: Secondary | ICD-10-CM

## 2021-07-26 DIAGNOSIS — G4700 Insomnia, unspecified: Secondary | ICD-10-CM

## 2021-07-26 DIAGNOSIS — G40909 Epilepsy, unspecified, not intractable, without status epilepticus: Secondary | ICD-10-CM

## 2021-07-26 NOTE — Progress Notes (Signed)
Patient ID: Sabrina Johnston, female   DOB: 1956-01-29, 65 y.o.   MRN: [id number] [facility] PROGRESS NOTE  MARKESIA CRILLY WLN:[id number] DOB: 1956-08-09 DOA: 06/01/2021 PCP: Lauro Regulus, MD  HPI/Subjective: Patient unable to speak.  Admitted 06/02/2021 with agitation  Objective: Vitals:   07/26/21 0003 07/26/21 0809  BP: 110/62 (!) 112/59  Pulse: 98 94  Resp: 16 17  Temp: 98.7 F (37.1 C) 97.8 F (36.6 C)  SpO2: 96% 95%    Intake/Output Summary (Last 24 hours) at 07/26/2021 1556 Last data filed at 07/26/2021 1409 Gross per 24 hour  Intake 480 ml  Output --  Net 480 ml   Filed Weights   07/10/21 1416 07/15/21 0824 07/21/21 2054  Weight: 65.5 kg 60.4 kg 63 kg    ROS: Review of Systems  Unable to perform ROS: Patient nonverbal  Exam: Physical Exam HENT:     Head: Normocephalic.     Mouth/Throat:     Pharynx: No oropharyngeal exudate.  Eyes:     General: Lids are normal.     Conjunctiva/sclera: Conjunctivae normal.  Cardiovascular:     Rate and Rhythm: Normal rate and regular rhythm.     Heart sounds: Normal heart sounds, S1 normal and S2 normal.  Pulmonary:     Breath sounds: No decreased breath sounds, wheezing, rhonchi or rales.  Abdominal:     Palpations: Abdomen is soft.     Tenderness: There is no abdominal tenderness.  Musculoskeletal:     Right lower leg: Swelling present.     Left lower leg: Swelling present.  Skin:    General: Skin is warm.     Findings: No rash.  Neurological:     Mental Status: She is alert.      Scheduled Meds:  divalproex  500 mg Oral BID   enoxaparin (LOVENOX) injection  40 mg Subcutaneous Q24H   feeding supplement  237 mL Oral Q24H   melatonin  5 mg Oral QHS   pantoprazole  40 mg Oral Daily   polyethylene glycol  17 g Oral BID   senna-docusate  2 tablet Oral BID   sodium chloride flush  10-40 mL Intracatheter Q12H   traZODone  100 mg Oral QHS   ziprasidone  20 mg Oral BID WC      Assessment/Plan:  Severe intellectual disability with intermittent agitation.  On ziprasidone, trazodone and Depakote.   Hypotension.  This has resolved Insomnia on trazodone Epilepsy on Depakote.  Check a Depakote level tomorrow morning Previous E. coli bacteremia which was treated    Code Status:     Code Status Orders  (From admission, onward)           Start     Ordered   07/03/21 1127  Do not attempt resuscitation (DNR)  Continuous       Question Answer Comment  In the event of cardiac or respiratory ARREST Do not call a "code blue"   In the event of cardiac or respiratory ARREST Do not perform Intubation, CPR, defibrillation or ACLS   In the event of cardiac or respiratory ARREST Use medication by any route, position, wound care, and other measures to relive pain and suffering. May use oxygen, suction and manual treatment of airway obstruction as needed for comfort.      07/03/21 1126           Code Status History     Date Active Date Inactive Code Status Order ID Comments User Context  06/02/2021 0950 07/03/2021 1126 Full Code [id number]  Lucile Shutters, MD ED   05/27/2021 0440 06/01/2021 1819 Full Code [id number]  Mansy, Vernetta Honey, MD ED      Family Communication: Spoke with sister on the phone Disposition Plan: Status is: Inpatient  Dispo: The patient is from: Group home              Anticipated d/c is to: Transitional care team and via working on placement.              Patient currently medically stable once a facility is found   Difficult to place patient.  Yes.  Time spent: 26 minutes  [employer]

## 2021-07-26 NOTE — Plan of Care (Signed)
  Problem: Clinical Measurements: Goal: Will remain free from infection Outcome: Progressing   Problem: Clinical Measurements: Goal: Respiratory complications will improve Outcome: Progressing   Problem: Clinical Measurements: Goal: Cardiovascular complication will be avoided Outcome: Progressing   Problem: Nutrition: Goal: Adequate nutrition will be maintained Outcome: Progressing   Problem: Elimination: Goal: Will not experience complications related to bowel motility Outcome: Progressing   Problem: Pain Managment: Goal: General experience of comfort will improve Outcome: Progressing   Problem: Safety: Goal: Ability to remain free from injury will improve Outcome: Progressing   Problem: Skin Integrity: Goal: Risk for impaired skin integrity will decrease Outcome: Progressing

## 2021-07-27 LAB — CBC
HCT: 37.9 % (ref 36.0–46.0)
Hemoglobin: 13 g/dL (ref 12.0–15.0)
MCH: 37.5 pg — ABNORMAL HIGH (ref 26.0–34.0)
MCHC: 34.3 g/dL (ref 30.0–36.0)
MCV: 109.2 fL — ABNORMAL HIGH (ref 80.0–100.0)
Platelets: 326 10*3/uL (ref 150–400)
RBC: 3.47 MIL/uL — ABNORMAL LOW (ref 3.87–5.11)
RDW: 12.8 % (ref 11.5–15.5)
WBC: 6 10*3/uL (ref 4.0–10.5)
nRBC: 0 % (ref 0.0–0.2)

## 2021-07-27 LAB — BASIC METABOLIC PANEL
Anion gap: 6 (ref 5–15)
BUN: 26 mg/dL — ABNORMAL HIGH (ref 8–23)
CO2: 26 mmol/L (ref 22–32)
Calcium: 9 mg/dL (ref 8.9–10.3)
Chloride: 106 mmol/L (ref 98–111)
Creatinine, Ser: 0.75 mg/dL (ref 0.44–1.00)
GFR, Estimated: >60 mL/min (ref >60)
Glucose, Bld: 98 mg/dL (ref 70–99)
Potassium: 4 mmol/L (ref 3.5–5.1)
Sodium: 138 mmol/L (ref 135–145)

## 2021-07-27 LAB — VALPROIC ACID LEVEL: Valproic Acid Lvl: 88 ug/mL (ref 50.0–100.0)

## 2021-07-27 MED ORDER — TUBERCULIN PPD 5 UNIT/0.1ML ID SOLN
5.0000 [IU] | Freq: Once | INTRADERMAL | Status: AC
  Administered 2021-07-27: 5 [IU] via INTRADERMAL
  Filled 2021-07-27: qty 0.1

## 2021-07-27 NOTE — Progress Notes (Signed)
Patient ID: Sabrina Johnston, female   DOB: 1956-02-01, 65 y.o.   MRN: [id number] [facility] PROGRESS NOTE  CLOE SOCKWELL ZWC:[id number] DOB: 05-17-1956 DOA: 06/01/2021 PCP: Lauro Regulus, MD  HPI/Subjective: Patient nonverbal.  Admitted 55 days ago for agitation.  Awaiting respite care.  Objective: Vitals:   07/27/21 0456 07/27/21 0925  BP: 131/75 (!) 115/91  Pulse: (!) 102 95  Resp: 18 20  Temp: 98.2 F (36.8 C)   SpO2: 95% 97%    Intake/Output Summary (Last 24 hours) at 07/27/2021 1346 Last data filed at 07/27/2021 1004 Gross per 24 hour  Intake 720 ml  Output --  Net 720 ml   Filed Weights   07/10/21 1416 07/15/21 0824 07/21/21 2054  Weight: 65.5 kg 60.4 kg 63 kg    ROS: Review of Systems  Unable to perform ROS: Acuity of condition  Exam: Physical Exam HENT:     Head: Normocephalic.     Mouth/Throat:     Pharynx: No oropharyngeal exudate.  Eyes:     General: Lids are normal.     Conjunctiva/sclera: Conjunctivae normal.  Cardiovascular:     Rate and Rhythm: Normal rate and regular rhythm.     Heart sounds: Normal heart sounds, S1 normal and S2 normal.  Pulmonary:     Breath sounds: No decreased breath sounds, wheezing, rhonchi or rales.  Abdominal:     Palpations: Abdomen is soft.     Tenderness: There is no abdominal tenderness.  Musculoskeletal:     Right ankle: Swelling present.     Left ankle: Swelling present.  Skin:    General: Skin is warm.     Comments: Right inner arm looks like a couple bruises  Neurological:     Mental Status: She is alert.      Scheduled Meds:  divalproex  500 mg Oral BID   enoxaparin (LOVENOX) injection  40 mg Subcutaneous Q24H   feeding supplement  237 mL Oral Q24H   melatonin  5 mg Oral QHS   pantoprazole  40 mg Oral Daily   polyethylene glycol  17 g Oral BID   senna-docusate  2 tablet Oral BID   sodium chloride flush  10-40 mL Intracatheter Q12H   traZODone  100 mg Oral QHS   tuberculin  5 Units  Intradermal Once   ziprasidone  20 mg Oral BID WC    Assessment/Plan:  Severe intellectual disability with intermittent agitation.  Patient on ziprasidone, trazodone and Depakote.  Awaiting respite care.  PPD ordered.  Patient was banging her head against the head on the bed.  I did place a pillow underneath her head today.  Patient has a Comptroller. Previous hypotension has resolved Insomnia on trazodone Epilepsy on Depakote.  Depakote level normal. Previous E. coli bacteremia which was treated.    Code Status:     Code Status Orders  (From admission, onward)           Start     Ordered   07/03/21 1127  Do not attempt resuscitation (DNR)  Continuous       Question Answer Comment  In the event of cardiac or respiratory ARREST Do not call a "code blue"   In the event of cardiac or respiratory ARREST Do not perform Intubation, CPR, defibrillation or ACLS   In the event of cardiac or respiratory ARREST Use medication by any route, position, wound care, and other measures to relive pain and suffering. May use oxygen, suction and manual treatment  of airway obstruction as needed for comfort.      07/03/21 1126           Code Status History     Date Active Date Inactive Code Status Order ID Comments User Context   06/02/2021 0950 07/03/2021 1126 Full Code [id number]  Lucile Shutters, MD ED   05/27/2021 0440 06/01/2021 1819 Full Code [id number]  Mansy, Vernetta Honey, MD ED      Family Communication: Updated family yesterday Disposition Plan: Status is: Inpatient.  Transitional care team still working on placement.  Time spent: 25 minutes, case discussed with transitional care team  [employer]  [facility]

## 2021-07-27 NOTE — TOC Progression Note (Signed)
Transition of Care [facility]) - Progression Note    Patient Details  Name: Sabrina Johnston MRN: [id number] Date of Birth: 06-Jul-1956  [facility]) CM/SW Contact  Allayne Butcher, RN Phone Number: 07/27/2021, 3:59 PM  Clinical Narrative:    Patient is first on the list for respite care and may have an opening soon.  [facility] requested a TB skin test which MD ordered and 2 forms that need to be filled out which RNCM will provide to MD tomorrow to complete.     Expected Discharge Plan: Group Home Barriers to Discharge: Requiring sitter/restraints, Unsafe home situation  Expected Discharge Plan and Services Expected Discharge Plan: Group Home   Discharge Planning Services: CM Consult   Living arrangements for the past 2 months: Group Home                 DME Arranged: N/A DME Agency: NA       HH Arranged: NA           Social Determinants of Health (SDOH) Interventions    Readmission Risk Interventions Readmission Risk Prevention Plan 06/06/2021  Post Dischage Appt Complete  Medication Screening Complete  Transportation Screening Complete  Some recent data might be hidden

## 2021-07-27 NOTE — Progress Notes (Signed)
PT Cancellation Note  Patient Details Name: Sabrina Johnston MRN: [id number] DOB: [birth year]   Cancelled Treatment:    Reason Eval/Treat Not Completed: PT screened, no needs identified, will sign off (Repeat consult received.  Per chart review and discussion with attending, patient care needs currently being met by nursing. No acute PT needs identified.  Order completed)  Reyes Ivan. Owens Shark, PT, DPT, NCS 07/27/21, 1:17 PM [phone]

## 2021-07-27 NOTE — Plan of Care (Signed)

## 2021-07-28 MED ORDER — MELATONIN 5 MG PO TABS: 5.0000 mg | ORAL_TABLET | Freq: Every day | ORAL | 0 refills | Status: AC

## 2021-07-28 MED ORDER — ACETAMINOPHEN 325 MG PO TABS: 650.0000 mg | ORAL_TABLET | Freq: Four times a day (QID) | ORAL | Status: AC | PRN

## 2021-07-28 MED ORDER — PANTOPRAZOLE SODIUM 40 MG PO TBEC: 40.0000 mg | DELAYED_RELEASE_TABLET | Freq: Every day | ORAL | 0 refills | Status: AC

## 2021-07-28 MED ORDER — ZIPRASIDONE HCL 20 MG PO CAPS: 20.0000 mg | ORAL_CAPSULE | Freq: Two times a day (BID) | ORAL | 0 refills | Status: DC

## 2021-07-28 MED ORDER — POLYETHYLENE GLYCOL 3350 17 G PO PACK: 17.0000 g | PACK | Freq: Two times a day (BID) | ORAL | 0 refills | Status: AC

## 2021-07-28 MED ORDER — BISACODYL 10 MG RE SUPP: 10.0000 mg | Freq: Every day | RECTAL | 0 refills | Status: AC | PRN

## 2021-07-28 MED ORDER — TRAZODONE HCL 100 MG PO TABS: 100.0000 mg | ORAL_TABLET | Freq: Every day | ORAL | 0 refills | Status: DC

## 2021-07-28 MED ORDER — ENSURE ENLIVE PO LIQD: 237.0000 mL | ORAL | 12 refills | Status: DC

## 2021-07-28 NOTE — Progress Notes (Signed)
Patient ID: Sabrina Johnston, female   DOB: 1956/05/01, 65 y.o.   MRN: [id number] [facility] PROGRESS NOTE  Sabrina Johnston [id number] DOB: 02/02/1956 DOA: 06/01/2021 PCP: Sabrina Regulus, MD  HPI/Subjective: Patient nonverbal.  Did let me look at her arm today.  Admitted 56 days ago with agitation.  Objective: Vitals:   07/27/21 2236 07/28/21 0450  BP: (!) 97/54 116/61  Pulse: 94 87  Resp: 17 17  Temp: (!) 97.4 F (36.3 C) 97.6 F (36.4 C)  SpO2: 98% 99%    Intake/Output Summary (Last 24 hours) at 07/28/2021 1551 Last data filed at 07/28/2021 1422 Gross per 24 hour  Intake 440 ml  Output 0 ml  Net 440 ml   Filed Weights   07/10/21 1416 07/15/21 0824 07/21/21 2054  Weight: 65.5 kg 60.4 kg 63 kg    ROS: Review of Systems  Unable to perform ROS: Patient nonverbal  Exam: Physical Exam HENT:     Mouth/Throat:     Comments: Unable to look into mouth today. Eyes:     General: Lids are normal.     Conjunctiva/sclera: Conjunctivae normal.  Cardiovascular:     Rate and Rhythm: Normal rate and regular rhythm.     Heart sounds: Normal heart sounds, S1 normal and S2 normal.  Pulmonary:     Breath sounds: Normal breath sounds. No decreased breath sounds, wheezing, rhonchi or rales.  Abdominal:     Palpations: Abdomen is soft.     Tenderness: There is no abdominal tenderness.  Musculoskeletal:     Right lower leg: Swelling present.     Left lower leg: Swelling present.  Skin:    General: Skin is warm.     Comments: Bruising inside right arm.  Neurological:     Mental Status: She is alert.      Scheduled Meds:  divalproex  500 mg Oral BID   enoxaparin (LOVENOX) injection  40 mg Subcutaneous Q24H   feeding supplement  237 mL Oral Q24H   melatonin  5 mg Oral QHS   pantoprazole  40 mg Oral Daily   polyethylene glycol  17 g Oral BID   senna-docusate  2 tablet Oral BID   sodium chloride flush  10-40 mL Intracatheter Q12H   traZODone  100 mg Oral QHS    tuberculin  5 Units Intradermal Once   ziprasidone  20 mg Oral BID WC    Assessment/Plan:  Severe intellectual disability with intermittent agitation.  Continue trazodone, Depakote and ziprasidone.  Awaiting respite care facility. Previous hypotension.  Blood pressure up-and-down. Insomnia on trazodone Seizure disorder on Depakote Previous E. coli bacteremia which was treated PPD will be read tomorrow afternoon versus Sunday morning        Code Status:     Code Status Orders  (From admission, onward)           Start     Ordered   07/03/21 1127  Do not attempt resuscitation (DNR)  Continuous       Question Answer Comment  In the event of cardiac or respiratory ARREST Do not call a "code blue"   In the event of cardiac or respiratory ARREST Do not perform Intubation, CPR, defibrillation or ACLS   In the event of cardiac or respiratory ARREST Use medication by any route, position, wound care, and other measures to relive pain and suffering. May use oxygen, suction and manual treatment of airway obstruction as needed for comfort.      09 /19/22  1126           Code Status History     Date Active Date Inactive Code Status Order ID Comments User Context   06/02/2021 0950 07/03/2021 1126 Full Code [id number]  Lucile Shutters, MD ED   05/27/2021 0440 06/01/2021 1819 Full Code [id number]  Mansy, Vernetta Honey, MD ED      Disposition Plan: Status is: Inpatient.  Transitional care team still working on disposition with respite care.  Hopefully next week.  Time spent: 25 minutes  Dontrel [employer]

## 2021-07-28 NOTE — TOC Progression Note (Signed)
Transition of Care [facility]) - Progression Note    Patient Details  Name: Sabrina Johnston MRN: [id number] Date of Birth: May 07, 1956  Transition of Care [facility]) CM/SW Contact  Allayne Butcher, RN Phone Number: 07/28/2021, 3:58 PM  Clinical Narrative:    Joanne Chars updated at [facility]- patient TB test given yesterday and will be read tomorrow.  MD filled out Medication order forms that she sent and RNCM has securely emailed them back to her.     Expected Discharge Plan: Group Home Barriers to Discharge: Requiring sitter/restraints, Unsafe home situation  Expected Discharge Plan and Services Expected Discharge Plan: Group Home   Discharge Planning Services: CM Consult   Living arrangements for the past 2 months: Group Home                 DME Arranged: N/A DME Agency: NA       HH Arranged: NA           Social Determinants of Health (SDOH) Interventions    Readmission Risk Interventions Readmission Risk Prevention Plan 06/06/2021  Post Dischage Appt Complete  Medication Screening Complete  Transportation Screening Complete  Some recent data might be hidden

## 2021-07-29 DIAGNOSIS — R5383 Other fatigue: Secondary | ICD-10-CM

## 2021-07-29 NOTE — Progress Notes (Signed)
Patient ID: Sabrina Johnston, female   DOB: April 21, 1956, 65 y.o.   MRN: [id number] [facility] PROGRESS NOTE  Sabrina Johnston [id number] DOB: [birth year] DOA: 06/01/2021 PCP: Lauro Regulus, MD  HPI/Subjective: Patient was sleeping hard this morning.  Initially admitted 57 days ago with agitation.  Only had a little bit for breakfast this morning.  Objective: Vitals:   07/29/21 0549 07/29/21 0821  BP: 101/65 108/66  Pulse: (!) 101 85  Resp: 20 16  Temp: 98.7 F (37.1 C) 97.9 F (36.6 C)  SpO2: 95% 100%    Intake/Output Summary (Last 24 hours) at 07/29/2021 1520 Last data filed at 07/29/2021 1419 Gross per 24 hour  Intake 360 ml  Output --  Net 360 ml   Filed Weights   07/10/21 1416 07/15/21 0824 07/21/21 2054  Weight: 65.5 kg 60.4 kg 63 kg    ROS: Review of Systems  Unable to perform ROS: Patient nonverbal  Exam: Physical Exam HENT:     Mouth/Throat:     Comments: Unable to look into mouth today. Eyes:     General: Lids are normal.     Conjunctiva/sclera: Conjunctivae normal.  Cardiovascular:     Rate and Rhythm: Normal rate and regular rhythm.     Heart sounds: Normal heart sounds, S1 normal and S2 normal.  Pulmonary:     Breath sounds: Normal breath sounds. No decreased breath sounds, wheezing, rhonchi or rales.  Abdominal:     Palpations: Abdomen is soft.     Tenderness: There is no abdominal tenderness.  Musculoskeletal:     Right lower leg: Swelling present.     Left lower leg: Swelling present.  Skin:    General: Skin is warm.     Comments: Bruising right arm.  PPD negative.  Neurological:     Mental Status: She is lethargic.      Scheduled Meds:  divalproex  500 mg Oral BID   enoxaparin (LOVENOX) injection  40 mg Subcutaneous Q24H   feeding supplement  237 mL Oral Q24H   melatonin  5 mg Oral QHS   pantoprazole  40 mg Oral Daily   polyethylene glycol  17 g Oral BID   senna-docusate  2 tablet Oral BID   sodium chloride flush  10-40  mL Intracatheter Q12H   traZODone  100 mg Oral QHS   tuberculin  5 Units Intradermal Once   ziprasidone  20 mg Oral BID WC    Assessment/Plan:  Severe intellectual disability with intermittent agitation.  This morning more lethargic than usual.  She did eat a little bit of breakfast but not much.  We will see how she does the rest of today.  Patient on trazodone Depakote and ziprasidone. Previous hypotension.  Blood pressure up-and-down based on mood. Insomnia on trazodone Seizure disorder on Depakote Previous E. coli bacteremia which was treated PPD negative read by me      Code Status:     Code Status Orders  (From admission, onward)           Start     Ordered   07/03/21 1127  Do not attempt resuscitation (DNR)  Continuous       Question Answer Comment  In the event of cardiac or respiratory ARREST Do not call a "code blue"   In the event of cardiac or respiratory ARREST Do not perform Intubation, CPR, defibrillation or ACLS   In the event of cardiac or respiratory ARREST Use medication by any route, position, wound  care, and other measures to relive pain and suffering. May use oxygen, suction and manual treatment of airway obstruction as needed for comfort.      07/03/21 1126           Code Status History     Date Active Date Inactive Code Status Order ID Comments User Context   06/02/2021 0950 07/03/2021 1126 Full Code [id number]  Lucile Shutters, MD ED   05/27/2021 0440 06/01/2021 1819 Full Code [id number]  Mansy, Vernetta Honey, MD ED      Disposition Plan: Status is: Inpatient  Time spent: 26 minutes, case discussed with nursing staff  [name]  [facility]

## 2021-07-30 MED ORDER — SODIUM CHLORIDE 0.9 % IV BOLUS
1000.0000 mL | Freq: Once | INTRAVENOUS | Status: AC
  Administered 2021-07-30: 1000 mL via INTRAVENOUS

## 2021-07-30 MED ORDER — ZIPRASIDONE HCL 20 MG PO CAPS
20.0000 mg | ORAL_CAPSULE | Freq: Every day | ORAL | Status: DC
  Administered 2021-07-31 – 2021-08-02 (×3): 20 mg via ORAL
  Filled 2021-07-30 (×4): qty 1

## 2021-07-30 NOTE — Progress Notes (Signed)
Patient ID: AVAEH EWER, female   DOB: 17-Aug-1956, 66 y.o.   MRN: [id number] [facility] PROGRESS NOTE  BETHANEY OSHANA [id number] DOB: [birth year] DOA: 06/01/2021 PCP: Lauro Regulus, MD  HPI/Subjective: Patient more lethargic today.  As per the aide she did get up and drink a little bit for breakfast but did not eat.  Did not eat much yesterday but did drink.  Objective: Vitals:   07/30/21 0017 07/30/21 0820  BP: 105/67 121/72  Pulse: 88 90  Resp: 18 16  Temp: 98 F (36.7 C) 98.6 F (37 C)  SpO2: 100% 97%    Intake/Output Summary (Last 24 hours) at 07/30/2021 1225 Last data filed at 07/30/2021 1052 Gross per 24 hour  Intake 60 ml  Output --  Net 60 ml   Filed Weights   07/10/21 1416 07/15/21 0824 07/21/21 2054  Weight: 65.5 kg 60.4 kg 63 kg    ROS: Review of Systems  Unable to perform ROS: Patient nonverbal  Exam: Physical Exam HENT:     Mouth/Throat:     Comments: Unable to look into mouth,   Eyes:     General: Lids are normal.     Comments: Unable to look into illness today  Cardiovascular:     Rate and Rhythm: Normal rate and regular rhythm.     Heart sounds: Normal heart sounds, S1 normal and S2 normal.  Pulmonary:     Breath sounds: No decreased breath sounds, wheezing, rhonchi or rales.  Abdominal:     Palpations: Abdomen is soft.     Tenderness: There is no abdominal tenderness.  Musculoskeletal:     Right ankle: Swelling present.     Left ankle: Swelling present.  Skin:    General: Skin is warm.     Findings: No rash.  Neurological:     Mental Status: She is lethargic.      Scheduled Meds:  divalproex  500 mg Oral BID   enoxaparin (LOVENOX) injection  40 mg Subcutaneous Q24H   feeding supplement  237 mL Oral Q24H   melatonin  5 mg Oral QHS   pantoprazole  40 mg Oral Daily   polyethylene glycol  17 g Oral BID   senna-docusate  2 tablet Oral BID   sodium chloride flush  10-40 mL Intracatheter Q12H   traZODone  100 mg  Oral QHS   [START ON 07/31/2021] ziprasidone  20 mg Oral QHS   Continuous Infusions:  sodium chloride      Assessment/Plan:  Lethargy today.  No signs of fever at this point.  We will check labs tomorrow morning.  10 chest x-ray.  Give a fluid bolus.  Continue to monitor.  As per staff does get up and drinks a little bit but did not eat.  Hold psychiatric medications this morning.  Check Depakote level tomorrow morning. Severe intellectual disability with intermittent agitation.  This morning she is lethargic again.  Hold the morning medications.  Nighttime trazodone and Geodon.  Continue Depakote if able to take. Insomnia on trazodone Seizure disorder on Depakote Previous hypotension.  Blood pressure variable based on mood PPD read by me on 07/29/2001 negative.     Code Status:     Code Status Orders  (From admission, onward)           Start     Ordered   07/03/21 1127  Do not attempt resuscitation (DNR)  Continuous       Question Answer Comment  In the event  of cardiac or respiratory ARREST Do not call a "code blue"   In the event of cardiac or respiratory ARREST Do not perform Intubation, CPR, defibrillation or ACLS   In the event of cardiac or respiratory ARREST Use medication by any route, position, wound care, and other measures to relive pain and suffering. May use oxygen, suction and manual treatment of airway obstruction as needed for comfort.      07/03/21 1126           Code Status History     Date Active Date Inactive Code Status Order ID Comments User Context   06/02/2021 0950 07/03/2021 1126 Full Code [id number]  Lucile Shutters, MD ED   05/27/2021 0440 06/01/2021 1819 Full Code [id number]  Mansy, Vernetta Honey, MD ED      Family Communication: Updated patient's sister on the phone Disposition Plan: Status is: Inpatient  Time spent: 27 minutes  Sharief [employer]

## 2021-07-30 NOTE — Progress Notes (Signed)
REFUSED

## 2021-07-31 ENCOUNTER — Inpatient Hospital Stay: Payer: Medicare Other

## 2021-07-31 LAB — URINALYSIS, COMPLETE (UACMP) WITH MICROSCOPIC
Bilirubin Urine: NEGATIVE
Glucose, UA: NEGATIVE mg/dL
Hgb urine dipstick: NEGATIVE
Ketones, ur: 20 mg/dL — AB
Leukocytes,Ua: NEGATIVE
Nitrite: NEGATIVE
Protein, ur: NEGATIVE mg/dL
Specific Gravity, Urine: 1.021 (ref 1.005–1.030)
Squamous Epithelial / HPF: NONE SEEN (ref 0–5)
pH: 5 (ref 5.0–8.0)

## 2021-07-31 LAB — CBC
HCT: 37 % (ref 36.0–46.0)
Hemoglobin: 12.8 g/dL (ref 12.0–15.0)
MCH: 37.5 pg — ABNORMAL HIGH (ref 26.0–34.0)
MCHC: 34.6 g/dL (ref 30.0–36.0)
MCV: 108.5 fL — ABNORMAL HIGH (ref 80.0–100.0)
Platelets: 299 10*3/uL (ref 150–400)
RBC: 3.41 MIL/uL — ABNORMAL LOW (ref 3.87–5.11)
RDW: 12.5 % (ref 11.5–15.5)
WBC: 6.1 10*3/uL (ref 4.0–10.5)
nRBC: 0 % (ref 0.0–0.2)

## 2021-07-31 LAB — BASIC METABOLIC PANEL
Anion gap: 4 — ABNORMAL LOW (ref 5–15)
BUN: 22 mg/dL (ref 8–23)
CO2: 27 mmol/L (ref 22–32)
Calcium: 8.6 mg/dL — ABNORMAL LOW (ref 8.9–10.3)
Chloride: 109 mmol/L (ref 98–111)
Creatinine, Ser: 0.65 mg/dL (ref 0.44–1.00)
GFR, Estimated: >60 mL/min (ref >60)
Glucose, Bld: 90 mg/dL (ref 70–99)
Potassium: 3.8 mmol/L (ref 3.5–5.1)
Sodium: 140 mmol/L (ref 135–145)

## 2021-07-31 LAB — VALPROIC ACID LEVEL: Valproic Acid Lvl: 68 ug/mL (ref 50.0–100.0)

## 2021-07-31 NOTE — Progress Notes (Signed)
Patient ID: Sabrina Johnston, female   DOB: [birth year], 65 y.o.   MRN: [id number] [facility] PROGRESS NOTE  DENIZ ESKRIDGE [id number] DOB: 1955-12-10 DOA: 06/01/2021 PCP: Lauro Regulus, MD  HPI/Subjective: Patient did not sleep last night so was sleeping again when I saw her today.  Unable to arouse.  She was able to hold her left arm up once on a moved it.  Objective: Vitals:   07/31/21 0300 07/31/21 1157  BP: 130/66 108/64  Pulse: 83 76  Resp: 17 18  Temp: 98 F (36.7 C) 98.8 F (37.1 C)  SpO2: 99% 90%    Intake/Output Summary (Last 24 hours) at 07/31/2021 1659 Last data filed at 07/30/2021 2042 Gross per 24 hour  Intake 360 ml  Output --  Net 360 ml   Filed Weights   07/10/21 1416 07/15/21 0824 07/21/21 2054  Weight: 65.5 kg 60.4 kg 63 kg    ROS: Review of Systems  Respiratory:  Negative for shortness of breath.   Cardiovascular:  Negative for chest pain.  Gastrointestinal:  Negative for abdominal pain, nausea and vomiting.  Exam: Physical Exam HENT:     Mouth/Throat:     Comments: Unable to look in mouth. Eyes:     General: Lids are normal.     Comments: With her positioning in the bed and unable to open up her eyes today  Cardiovascular:     Rate and Rhythm: Normal rate and regular rhythm.     Heart sounds: Normal heart sounds, S1 normal and S2 normal.  Pulmonary:     Breath sounds: No decreased breath sounds, wheezing, rhonchi or rales.  Abdominal:     Palpations: Abdomen is soft.     Tenderness: There is no abdominal tenderness.  Musculoskeletal:     Right lower leg: Swelling present.     Left lower leg: Swelling present.  Skin:    General: Skin is warm.     Comments: Bruising right arm.  Neurological:     Mental Status: She is lethargic.      Scheduled Meds:  divalproex  500 mg Oral BID   enoxaparin (LOVENOX) injection  40 mg Subcutaneous Q24H   feeding supplement  237 mL Oral Q24H   melatonin  5 mg Oral QHS   pantoprazole   40 mg Oral Daily   polyethylene glycol  17 g Oral BID   senna-docusate  2 tablet Oral BID   sodium chloride flush  10-40 mL Intracatheter Q12H   traZODone  100 mg Oral QHS   ziprasidone  20 mg Oral QHS     Assessment/Plan:  Lethargy.  Apparently patient did not sleep last night.  Labs today unremarkable.  Chest x-ray.  Get urinalysis and urine culture with In and Out catheter.  Depakote level normal. Severe intellectual disability with intermittent agitation.  Patient lethargic this morning again.  Apparently did not sleep last night.  Got rid of the morning Geodon yesterday.  Continue nighttime trazodone and Geodon.  Depakote if able to take. Insomnia on trazodone at night. Seizure disorder on Depakote     Code Status:     Code Status Orders  (From admission, onward)           Start     Ordered   07/03/21 1127  Do not attempt resuscitation (DNR)  Continuous       Question Answer Comment  In the event of cardiac or respiratory ARREST Do not call a "code blue"   In  the event of cardiac or respiratory ARREST Do not perform Intubation, CPR, defibrillation or ACLS   In the event of cardiac or respiratory ARREST Use medication by any route, position, wound care, and other measures to relive pain and suffering. May use oxygen, suction and manual treatment of airway obstruction as needed for comfort.      07/03/21 1126           Code Status History     Date Active Date Inactive Code Status Order ID Comments User Context   06/02/2021 0950 07/03/2021 1126 Full Code [id number]  Lucile Shutters, MD ED   05/27/2021 0440 06/01/2021 1819 Full Code [id number]  Mansy, Vernetta Honey, MD ED      Family Communication: Updated sister yesterday Disposition Plan: Status is: Inpatient  Time spent: 26 minutes, case discussed with transitional care team  [facility]  [facility]

## 2021-08-01 ENCOUNTER — Inpatient Hospital Stay: Payer: Medicare Other

## 2021-08-01 LAB — URINE CULTURE: Culture: <10000 — AB

## 2021-08-01 MED ORDER — LORAZEPAM 2 MG/ML IJ SOLN
0.5000 mg | Freq: Once | INTRAMUSCULAR | Status: AC | PRN
  Administered 2021-08-01: 18:00:00 0.5 mg via INTRAVENOUS
  Filled 2021-08-01: qty 1

## 2021-08-01 NOTE — Progress Notes (Signed)
Nutrition Follow-up  DOCUMENTATION CODES:  Not applicable  INTERVENTION:  Continue current diet as ordered, nursing staff to assist with meals Ensure Enlive po daily, each supplement provides 350 kcal and 20 grams of protein Weekly weights  NUTRITION DIAGNOSIS:  Inadequate oral intake related to  (altered mental status) as evidenced by  (routinely eating <75% of meals).  GOAL:  Patient will meet greater than or equal to 90% of their needs  MONITOR:  PO intake, Supplement acceptance  REASON FOR ASSESSMENT:  Consult Calorie Count  ASSESSMENT:  65 y.o. female presented to the ED with AMS and falls after being discharged back to her group home 4 hours prior to return. PMH of mental retardation, epilepsy, and chronic constipation. Pt nonverbal at baseline. Previous admission for sepsis due to UTI.  Pt remains medically stable at this time, CM working on placement. Weight stable this admission, but none taken since 10/7, will request new weight. Intake declined this week, but intake has trended up and down throughout admission.   Average Meal Intake: 8/18-9/2: 54% intake x 18 recorded meals (0-100%) 9/3-9/8: 46% intake x 10 recorded meals (0-100%) 9/9-9/15: 81% intake x 10 recorded meals (0-100%) 9/16-9/20: 75% intake x 7 recorded meals (50-100%) 9/21-9/27: 92% intake x 10 recorded meals (75-100%) 9/28-10/4: 42% intake x 10 recorded meals (0-100%) 10/5-10/11: 82% intake x 7 recorded meals (50-100%) 10/12-10/18: 42% intake x 10 recorded meals (0-100%)  Nutritionally Relevant Medications: Scheduled Meds:  feeding supplement  237 mL Oral Q24H   pantoprazole  40 mg Oral Daily   PRN Meds: bisacodyl, ondansetron   Labs Reviewed  NUTRITION - FOCUSED PHYSICAL EXAM: Flowsheet Row Most Recent Value  Orbital Region No depletion  Upper Arm Region No depletion  Thoracic and Lumbar Region No depletion  Buccal Region No depletion  Temple Region No depletion  Clavicle Bone Region No  depletion  Clavicle and Acromion Bone Region No depletion  Scapular Bone Region No depletion  Dorsal Hand No depletion  Patellar Region No depletion  Anterior Thigh Region No depletion  Posterior Calf Region No depletion  Edema (RD Assessment) None  Hair Reviewed  Eyes Unable to assess  Mouth Unable to assess  Skin Reviewed  Nails Reviewed   Diet Order:   Diet Order             DIET DYS 3 Room service appropriate? Yes with Assist; Fluid consistency: Thin  Diet effective now                   EDUCATION NEEDS:  No education needs have been identified at this time  Skin:  Skin Assessment: Reviewed RN Assessment  Last BM:  10/18 - type 6  Height:  Ht Readings from Last 1 Encounters:  07/15/21 5' (1.524 m)   Weight:  Wt Readings from Last 1 Encounters:  07/21/21 63 kg   Ideal Body Weight:  45.5 kg  BMI:  Body mass index is 27.13 kg/m.  Estimated Nutritional Needs:  Kcal:  1400-1600 kcal/d Protein:  70-80 g/d Fluid:  1.5-1.8 L/d   Greig Castilla, RD, LDN Clinical Dietitian Pager on Amion

## 2021-08-01 NOTE — Progress Notes (Signed)
Patient ID: Sabrina Johnston, female   DOB: [birth year], 65 y.o.   MRN: [id number] [facility] PROGRESS NOTE  Sabrina Johnston [id number] DOB: 1956-07-27 DOA: 06/01/2021 PCP: Lauro Regulus, MD  HPI/Subjective: Patient moved around today when stimulated.  The last couple days prior to that she was just lying there when I came into the room.  Patient admitted 60 days ago with agitation.  Objective: Vitals:   08/01/21 0931 08/01/21 1627  BP: (!) 124/95 (!) 113/53  Pulse: 89 88  Resp: 16 18  Temp: 97.6 F (36.4 C) 98.4 F (36.9 C)  SpO2: 100% 100%    Intake/Output Summary (Last 24 hours) at 08/01/2021 1736 Last data filed at 08/01/2021 1356 Gross per 24 hour  Intake 600 ml  Output --  Net 600 ml   Filed Weights   07/10/21 1416 07/15/21 0824 07/21/21 2054  Weight: 65.5 kg 60.4 kg 63 kg    ROS: Review of Systems  Unable to perform ROS: Patient nonverbal  Exam: Physical Exam HENT:     Mouth/Throat:     Comments: Unable to look into mouth today Eyes:     General: Lids are normal.     Comments: Unable to look into eyes today  Cardiovascular:     Rate and Rhythm: Normal rate and regular rhythm.     Heart sounds: Normal heart sounds, S1 normal and S2 normal.  Pulmonary:     Breath sounds: Normal breath sounds. No decreased breath sounds, wheezing, rhonchi or rales.  Abdominal:     Palpations: Abdomen is soft.     Tenderness: There is no abdominal tenderness.  Musculoskeletal:     Right lower leg: Swelling present.     Left lower leg: Swelling present.  Skin:    General: Skin is warm.     Findings: No rash.  Neurological:     Comments: Today rolled around when I stimulated her.      Scheduled Meds:  divalproex  500 mg Oral BID   enoxaparin (LOVENOX) injection  40 mg Subcutaneous Q24H   feeding supplement  237 mL Oral Q24H   melatonin  5 mg Oral QHS   pantoprazole  40 mg Oral Daily   sodium chloride flush  10-40 mL Intracatheter Q12H   traZODone   100 mg Oral QHS   ziprasidone  20 mg Oral QHS   Brief history 65 year old female admitted 60 days ago with agitation at her group facility.  Transitional care team looking into treatment options.  Past medical history of severe intellectual disability, nonverbal, epilepsy.  Patient has a recent treatment of gram-negative bacteremia.  Over the last few days has been more lethargic and not eating as well.  She does drink some apple juice and eats chocolate ice cream.  Assessment/Plan:  Lethargy last few days.  Patient rolled when stimulated today.  We will get a CT scan of the head.  In and out urinary catheterization was negative yesterday.  Chest x-ray was rotated but read as negative.  White blood cell count normal.  Encouraged staff to feed her when she is awake.  We will get a calorie count. Severe intellectual disability with intermittent agitation.  Since patient has been more lethargic, I discontinued the morning Geodon.  Continue nighttime trazodone Geodon and Depakote if able to take.  Depakote level normal. Insomnia on trazodone and Geodon at night. Seizure disorder on Depakote.  Last Depakote level normal range. Stage II decubiti arm documented on 08/01/2021.  See full  description below. Patient had a few episodes of diarrhea I held anticonstipation medications today.  Pressure Injury 08/01/21 Arm Distal;Posterior;Right;Upper Stage 2 -  Partial thickness loss of dermis presenting as a shallow open injury with a red, pink wound bed without slough. red, pink, moist (Active)  08/01/21 0126  Location: Arm  Location Orientation: Distal;Posterior;Right;Upper  Staging: Stage 2 -  Partial thickness loss of dermis presenting as a shallow open injury with a red, pink wound bed without slough.  Wound Description (Comments): red, pink, moist  Present on Admission: No       Code Status:     Code Status Orders  (From admission, onward)           Start     Ordered   07/03/21 1127  Do  not attempt resuscitation (DNR)  Continuous       Question Answer Comment  In the event of cardiac or respiratory ARREST Do not call a "code blue"   In the event of cardiac or respiratory ARREST Do not perform Intubation, CPR, defibrillation or ACLS   In the event of cardiac or respiratory ARREST Use medication by any route, position, wound care, and other measures to relive pain and suffering. May use oxygen, suction and manual treatment of airway obstruction as needed for comfort.      07/03/21 1126           Code Status History     Date Active Date Inactive Code Status Order ID Comments User Context   06/02/2021 0950 07/03/2021 1126 Full Code [id number]  Lucile Shutters, MD ED   05/27/2021 0440 06/01/2021 1819 Full Code [id number]  Mansy, Vernetta Honey, MD ED      Disposition Plan: Status is: Inpatient.  Transitional care team working on long-term placement options.  Time spent: 27 minutes  [employer]oshiyuki [employer]

## 2021-08-02 LAB — RESP PANEL BY RT-PCR (FLU A&B, COVID) ARPGX2
Influenza A by PCR: NEGATIVE
Influenza B by PCR: NEGATIVE
SARS Coronavirus 2 by RT PCR: NEGATIVE

## 2021-08-02 MED ORDER — ENSURE ENLIVE PO LIQD
237.0000 mL | Freq: Two times a day (BID) | ORAL | Status: DC
  Administered 2021-08-03 – 2021-08-07 (×8): 237 mL via ORAL

## 2021-08-02 MED ORDER — BOOST / RESOURCE BREEZE PO LIQD CUSTOM
1.0000 | Freq: Three times a day (TID) | ORAL | Status: DC
  Administered 2021-08-04 – 2021-08-07 (×5): 1 via ORAL

## 2021-08-02 MED ORDER — ENSURE ENLIVE PO LIQD
237.0000 mL | Freq: Three times a day (TID) | ORAL | Status: DC
  Administered 2021-08-02: 237 mL via ORAL

## 2021-08-02 NOTE — TOC Progression Note (Addendum)
Transition of Care [facility]) - Progression Note    Patient Details  Name: Sabrina Johnston MRN: [id number] Date of Birth: 04-14-1956  Transition of Care [facility]) CM/SW Contact  Allayne Butcher, RN Phone Number: 08/02/2021, 1:49 PM  Clinical Narrative:    Patient has a bed at a respite facility, [facility].  TB test negative, COVID negative.  This information sent to [name] with [facility].  [name] reports that I should be hearing from Jana Half at [facility] soon about when patient can go.     Expected Discharge Plan: Group Home Barriers to Discharge: Requiring sitter/restraints, Unsafe home situation  Expected Discharge Plan and Services Expected Discharge Plan: Group Home   Discharge Planning Services: CM Consult   Living arrangements for the past 2 months: Group Home                 DME Arranged: N/A DME Agency: NA       HH Arranged: NA           Social Determinants of Health (SDOH) Interventions    Readmission Risk Interventions Readmission Risk Prevention Plan 06/06/2021  Post Dischage Appt Complete  Medication Screening Complete  Transportation Screening Complete  Some recent data might be hidden

## 2021-08-02 NOTE — Progress Notes (Signed)
PROGRESS NOTE    Sabrina Johnston   ZOX:[id number]  DOB: April 05, 1956  PCP: Lauro Regulus, MD    DOA: 06/01/2021 LOS: 60    Brief Narrative / Hospital Course to Date:   65 year old female admitted on 06/01/2021 with agitation, aggressive behavior and multiple falls at her group facility.  She had just been hospitalized for 6 days and treated for sepsis due to E. coli infection, treated with IV antibiotics, discharged only a few hours prior to this presentation.  She underwent extensive neurologic evaluation that was essentially unremarkable for anything acute to explain her presentation.  Transitional care team looking into treatment options.  Past medical history of severe intellectual disability, nonverbal, epilepsy.  Patient has a recent treatment of gram-negative bacteremia.  Over the last few days has been more lethargic and not eating as well.  She does drink some apple juice and eats chocolate ice cream.   Assessment & Plan   Principal Problem:   Severe intellectual disability Active Problems:   Generalized epilepsy (HCC)   Bacteremia due to Gram-negative bacteria   Seizure disorder (HCC)   Abnormal TSH   Pressure ulcer with abrasion, blister, partial thickness skin loss involving epidermis and/or dermis (HCC)   Insomnia   Lethargy   Lethargy -past few days patient has been less interactive, sleeping more.  CT head was ordered but unable to be obtained due to patient not able to lie flat and still despite sedation.  UA was checked by and out cath sample and negative.  Chest x-ray was rotated and therefore poor quality but negative for pneumonia and patient does not have respiratory symptoms.  No leukocytosis. --Continue to monitor --Delirium precautions --Patient when she is awake  Severe intellectual disability with intermittent agitation -due to recent lethargy, morning Geodon has been stopped.  Continue nighttime trazodone, Geodon and Depakote.  Insomnia -on  trazodone and Geodon at night  Seizure disorder -continue Depakote.  Last Depakote level normal.  Pressure injury of skin -present on admission --Continue wound care per instructions Pressure Injury 08/01/21 Arm Distal;Posterior;Right;Upper Stage 2 -  Partial thickness loss of dermis presenting as a shallow open injury with a red, pink wound bed without slough. red, pink, moist (Active)  08/01/21 0126  Location: Arm  Location Orientation: Distal;Posterior;Right;Upper  Staging: Stage 2 -  Partial thickness loss of dermis presenting as a shallow open injury with a red, pink wound bed without slough.  Wound Description (Comments): red, pink, moist  Present on Admission: No    Diarrhea -secondary to stool softeners/laxatives given due to history of chronic constipation.  These have been held.     Patient BMI: Body mass index is 24.5 kg/m.   DVT prophylaxis: enoxaparin (LOVENOX) injection 40 mg Start: 06/07/21 2200 Place and maintain sequential compression device Start: 06/07/21 1434   Diet:  Diet Orders (From admission, onward)     Start     Ordered   07/25/21 1554  DIET DYS 3 Room service appropriate? Yes with Assist; Fluid consistency: Thin  Diet effective now       Comments: Finger foods at meals; pt can have banana pudding per Speech ok.  Question Answer Comment  Room service appropriate? Yes with Assist   Fluid consistency: Thin      07/25/21 1554              Code Status: DNR   Subjective 08/02/21    Patient awake laying on her right side in bed when seen today.  She is nonverbal.  Comptroller at bedside reports she has been sleeping all morning and just now starting to wake up.  Did not eat breakfast yet.  No acute events reported but staff continue to state patient does not quite seem at her baseline, sleeping more than normal and less interactive.   Disposition Plan & Communication   Status is: Inpatient  Remains inpatient appropriate because: Long-term  placement is pending    Consults, Procedures, Significant Events   Consultants:  Palliative care  Procedures:  None  Antimicrobials:  Anti-infectives (From admission, onward)    Start     Dose/Rate Route Frequency Ordered Stop   06/04/21 1000  cephALEXin (KEFLEX) capsule 500 mg        500 mg Oral 4 times daily 06/04/21 0811 06/08/21 2329   06/02/21 1000  cefTRIAXone (ROCEPHIN) 2 g in sodium chloride 0.9 % 100 mL IVPB  Status:  Discontinued        2 g 200 mL/hr over 30 Minutes Intravenous Every 24 hours 06/02/21 0950 06/04/21 0810   06/02/21 0345  cefTRIAXone (ROCEPHIN) 1 g in sodium chloride 0.9 % 100 mL IVPB        1 g 200 mL/hr over 30 Minutes Intravenous  Once 06/02/21 0330 06/02/21 0452   06/01/21 2200  cefTRIAXone (ROCEPHIN) 1 g in sodium chloride 0.9 % 100 mL IVPB        1 g 200 mL/hr over 30 Minutes Intravenous  Once 06/01/21 2150 06/02/21 0039         Micro    Objective   Vitals:   08/02/21 0006 08/02/21 0606 08/02/21 1100 08/02/21 1158  BP: 125/65 108/79 (!) 114/57   Pulse: 80 85 77   Resp: 17 16 17    Temp: (!) 96.8 F (36 C) (!) 97.4 F (36.3 C) 97.6 F (36.4 C)   TempSrc: Axillary Axillary Axillary   SpO2: 100% 100% (!) 86%   Weight:    56.9 kg  Height:       No intake or output data in the 24 hours ending 08/02/21 1412 Filed Weights   07/15/21 0824 07/21/21 2054 08/02/21 1158  Weight: 60.4 kg 63 kg 56.9 kg    Physical Exam:  General exam: awake, no acute distress, nonverbal HEENT: moist mucus membranes, hearing grossly normal  Respiratory system: CTAB, no wheezes, rales or rhonchi, normal respiratory effort. Cardiovascular system: normal S1/S2, RRR, no pedal edema.   Gastrointestinal system: soft, NT, ND, no HSM felt, +bowel sounds. Central nervous system: Unable to evaluate as patient does not follow commands, grossly nonfocal exam aside from what appears to be baseline contractures/hypertonicity Extremities: Pressure dressings in place  over bony prominences on all extremities Skin: dry, intact, normal temperature   Labs   Data Reviewed: I have personally reviewed following labs and imaging studies  CBC: Recent Labs  Lab 07/27/21 0451 07/31/21 0457  WBC 6.0 6.1  HGB 13.0 12.8  HCT 37.9 37.0  MCV 109.2* 108.5*  PLT 326 299   Basic Metabolic Panel: Recent Labs  Lab 07/27/21 0451 07/31/21 0457  NA 138 140  K 4.0 3.8  CL 106 109  CO2 26 27  GLUCOSE 98 90  BUN 26* 22  CREATININE 0.75 0.65  CALCIUM 9.0 8.6*   GFR: Estimated Creatinine Clearance: 56.9 mL/min (by C-G formula based on SCr of 0.65 mg/dL). Liver Function Tests: No results for input(s): AST, ALT, ALKPHOS, BILITOT, PROT, ALBUMIN in the last 168 hours. No results for input(s): LIPASE, AMYLASE  in the last 168 hours. No results for input(s): AMMONIA in the last 168 hours. Coagulation Profile: No results for input(s): INR, PROTIME in the last 168 hours. Cardiac Enzymes: No results for input(s): CKTOTAL, CKMB, CKMBINDEX, TROPONINI in the last 168 hours. BNP (last 3 results) No results for input(s): PROBNP in the last 8760 hours. HbA1C: No results for input(s): HGBA1C in the last 72 hours. CBG: No results for input(s): GLUCAP in the last 168 hours. Lipid Profile: No results for input(s): CHOL, HDL, LDLCALC, TRIG, CHOLHDL, LDLDIRECT in the last 72 hours. Thyroid Function Tests: No results for input(s): TSH, T4TOTAL, FREET4, T3FREE, THYROIDAB in the last 72 hours. Anemia Panel: No results for input(s): VITAMINB12, FOLATE, FERRITIN, TIBC, IRON, RETICCTPCT in the last 72 hours. Sepsis Labs: No results for input(s): PROCALCITON, LATICACIDVEN in the last 168 hours.  Recent Results (from the past 240 hour(s))  Urine Culture     Status: Abnormal   Collection Time: 07/31/21  6:00 PM   Specimen: Urine, Catheterized  Result Value Ref Range Status   Specimen Description   Final    URINE, CATHETERIZED Performed at [facility], [address], [place], [place] [address]    Special Requests NONE  Final   Culture (A)  Final    <10,000 COLONIES/mL INSIGNIFICANT GROWTH Performed at [facility], [address] [place], [place] [address]    Report Status 08/01/2021 FINAL  Final  Resp Panel by RT-PCR (Flu A&B, Covid) Nasopharyngeal Swab     Status: None   Collection Time: 08/02/21 10:55 AM   Specimen: Nasopharyngeal Swab; Nasopharyngeal(NP) swabs in vial transport medium  Result Value Ref Range Status   SARS Coronavirus 2 by RT PCR NEGATIVE NEGATIVE Final    Comment: (NOTE) SARS-CoV-2 target nucleic acids are NOT DETECTED.  The SARS-CoV-2 RNA is generally detectable in upper respiratory specimens during the acute phase of infection. The lowest concentration of SARS-CoV-2 viral copies this assay can detect is 138 copies/mL. A negative result does not preclude SARS-Cov-2 infection and should not be used as the sole basis for treatment or other patient management decisions. A negative result may occur with  improper specimen collection/handling, submission of specimen other than nasopharyngeal swab, presence of viral mutation(s) within the areas targeted by this assay, and inadequate number of viral copies(<138 copies/mL). A negative result must be combined with clinical observations, patient history, and epidemiological information. The expected result is Negative.  Fact Sheet for Patients:  BloggerCourse.com  Fact Sheet for Healthcare Providers:  SeriousBroker.it  This test is no t yet approved or cleared by the [place] FDA and  has been authorized for detection and/or diagnosis of SARS-CoV-2 by FDA under an Emergency Use Authorization (EUA). This EUA will remain  in effect (meaning this test can be used) for the duration of the COVID-19 declaration under Section 564(b)(1) of the Act, 21 U.S.C.section 360bbb-3(b)(1), unless the authorization is terminated   or revoked sooner.       Influenza A by PCR NEGATIVE NEGATIVE Final   Influenza B by PCR NEGATIVE NEGATIVE Final    Comment: (NOTE) The Xpert Xpress SARS-CoV-2/FLU/RSV plus assay is intended as an aid in the diagnosis of influenza from Nasopharyngeal swab specimens and should not be used as a sole basis for treatment. Nasal washings and aspirates are unacceptable for Xpert Xpress SARS-CoV-2/FLU/RSV testing.  Fact Sheet for Patients: BloggerCourse.com  Fact Sheet for Healthcare Providers: SeriousBroker.it  This test is not yet approved or cleared by the  [place] [employer] trader and has been authorized for detection and/or diagnosis of SARS-CoV-2 by FDA under an TEFL teacher (EUA). This EUA will remain in effect (meaning this test can be used) for the duration of the COVID-19 declaration under Section 564(b)(1) of the Act, 21 U.S.C. section 360bbb-3(b)(1), unless the authorization is terminated or revoked.  Performed at [facility], [address], [place], [place] [address]       Imaging Studies   DG Chest [place] 1 View  Result Date: 07/31/2021 CLINICAL DATA:  Altered mental status EXAM: PORTABLE CHEST 1 VIEW COMPARISON:  06/01/2021 FINDINGS: Only the left hemithorax is completely included. Technologist reports attempted decubitus view, best possible image secondary to patient physical condition. The left lung field is grossly clear. Cardiomediastinal silhouette probably normal. Hazy right lung opacity likely due to technique, positioning and decubitus view. IMPRESSION: Very limited secondary to positioning and technical factors. The left lung is grossly clear. Electronically Signed   By: Jasmine Pang M.D.   On: 07/31/2021 17:09     Medications   Scheduled Meds:  divalproex  500 mg Oral BID   enoxaparin (LOVENOX) injection  40 mg Subcutaneous Q24H   feeding supplement  237 mL Oral TID BM   melatonin  5  mg Oral QHS   pantoprazole  40 mg Oral Daily   sodium chloride flush  10-40 mL Intracatheter Q12H   traZODone  100 mg Oral QHS   ziprasidone  20 mg Oral QHS   Continuous Infusions:     LOS: 60 days    Time spent: 30 minutes    Pennie Banter, DO [facility]  08/02/2021, 2:12 PM      If 7PM-7AM, please contact night-coverage. How to contact the [facility] Attending or Consulting provider 7A - 7P or covering provider during after hours 7P -7A, for this patient?    Check the care team in [facility] and look for a) attending/consulting TRH provider listed and b) the [facility] team listed Log into [email or web address] and use [facility]'s universal password to access. If you do not have the password, please contact the hospital operator. Locate the [facility] provider you are looking for under [facility] and page to a number that you can be directly reached. If you still have difficulty reaching the provider, please page the [facility] (Director on Call) for the Hospitalists listed on amion for assistance.

## 2021-08-03 LAB — AMMONIA: Ammonia: 41 umol/L — ABNORMAL HIGH (ref 9–35)

## 2021-08-03 NOTE — Progress Notes (Signed)
PROGRESS NOTE    Sabrina Johnston   IRC:[id number]  DOB: [birth year]  PCP: Lauro Regulus, MD    DOA: 06/01/2021 LOS: 56    Brief Narrative / Hospital Course to Date:   65 year old female admitted on 06/01/2021 with agitation, aggressive behavior and multiple falls at her group facility.  She had just been hospitalized for 6 days and treated for sepsis due to E. coli infection, treated with IV antibiotics, discharged only a few hours prior to this presentation.  She underwent extensive neurologic evaluation that was essentially unremarkable for anything acute to explain her presentation.  Transitional care team looking into treatment options.  Past medical history of severe intellectual disability, nonverbal, epilepsy.  Patient has a recent treatment of gram-negative bacteremia.  Over the last few days has been more lethargic and not eating as well.  She does drink some apple juice and eats chocolate ice cream.   Assessment & Plan   Principal Problem:   Severe intellectual disability Active Problems:   Generalized epilepsy (HCC)   Bacteremia due to Gram-negative bacteria   Seizure disorder (HCC)   Abnormal TSH   Pressure ulcer with abrasion, blister, partial thickness skin loss involving epidermis and/or dermis (HCC)   Insomnia   Lethargy   Lethargy -past few days patient has been less interactive, sleeping more.  CT head was ordered but unable to be obtained due to patient not able to lie flat and still despite sedation.   UA was checked by and out cath sample and negative, culture with insignificant growth.   Chest x-ray was rotated and therefore poor quality but negative for pneumonia and patient does not have respiratory symptoms.   No leukocytosis. 10/20: Ammonia level minimally elevated at 41 --Continue to monitor --Delirium precautions --Feed patient when she is awake  Severe intellectual disability with intermittent agitation -due to recent lethargy, morning Geodon  has been stopped.  Continue nighttime trazodone, Geodon and Depakote.  Insomnia -on trazodone and Geodon at night  Seizure disorder -continue Depakote.  Last Depakote level normal.  Pressure injury of skin -present on admission --Continue wound care per instructions Pressure Injury 08/01/21 Arm Distal;Posterior;Right;Upper Stage 2 -  Partial thickness loss of dermis presenting as a shallow open injury with a red, pink wound bed without slough. red, pink, moist (Active)  08/01/21 0126  Location: Arm  Location Orientation: Distal;Posterior;Right;Upper  Staging: Stage 2 -  Partial thickness loss of dermis presenting as a shallow open injury with a red, pink wound bed without slough.  Wound Description (Comments): red, pink, moist  Present on Admission: No    Diarrhea -secondary to stool softeners/laxatives given due to history of chronic constipation.  These have been held.     Patient BMI: Body mass index is 24.5 kg/m.   DVT prophylaxis: enoxaparin (LOVENOX) injection 40 mg Start: 06/07/21 2200 Place and maintain sequential compression device Start: 06/07/21 1434   Diet:  Diet Orders (From admission, onward)     Start     Ordered   07/25/21 1554  DIET DYS 3 Room service appropriate? Yes with Assist; Fluid consistency: Thin  Diet effective now       Comments: Finger foods at meals; pt can have banana pudding per Speech ok.  Question Answer Comment  Room service appropriate? Yes with Assist   Fluid consistency: Thin      07/25/21 1554              Code Status: DNR   Subjective 08/03/21  Patient seen with nurse techs at bedside attempting to reposition patient and set her up edge of bed.  She sits only briefly with assistance, then wants to lay back down on her right side.  Staff report she only lays on her right side.  Patient does not appear in any distress.  No acute events reported, however staff report patient being less interactive and seems not acting herself  past few days.  Is or other sign of infection.   Disposition Plan & Communication with   Status is: Inpatient  Remains inpatient appropriate because: Long-term placement is pending.  Anticipate discharge to respite facility tomorrow, 10/21    Consults, Procedures, Significant Events   Consultants:  Palliative care  Procedures:  None  Antimicrobials:  Anti-infectives (From admission, onward)    Start     Dose/Rate Route Frequency Ordered Stop   06/04/21 1000  cephALEXin (KEFLEX) capsule 500 mg        500 mg Oral 4 times daily 06/04/21 0811 06/08/21 2329   06/02/21 1000  cefTRIAXone (ROCEPHIN) 2 g in sodium chloride 0.9 % 100 mL IVPB  Status:  Discontinued        2 g 200 mL/hr over 30 Minutes Intravenous Every 24 hours 06/02/21 0950 06/04/21 0810   06/02/21 0345  cefTRIAXone (ROCEPHIN) 1 g in sodium chloride 0.9 % 100 mL IVPB        1 g 200 mL/hr over 30 Minutes Intravenous  Once 06/02/21 0330 06/02/21 0452   06/01/21 2200  cefTRIAXone (ROCEPHIN) 1 g in sodium chloride 0.9 % 100 mL IVPB        1 g 200 mL/hr over 30 Minutes Intravenous  Once 06/01/21 2150 06/02/21 0039         Micro    Objective   Vitals:   08/02/21 1500 08/02/21 2120 08/02/21 2351 08/03/21 0559  BP: 102/67 103/61 114/66 (!) 114/55  Pulse: 88 79 85 77  Resp: 17 16 17 17   Temp: 97.6 F (36.4 C) 98.5 F (36.9 C) 98 F (36.7 C) 97.7 F (36.5 C)  TempSrc: Axillary Axillary Axillary Axillary  SpO2: 95% 98% 97% 99%  Weight:      Height:       No intake or output data in the 24 hours ending 08/03/21 1425 Filed Weights   07/15/21 0824 07/21/21 2054 08/02/21 1158  Weight: 60.4 kg 63 kg 56.9 kg    Physical Exam:  General exam: awake, no acute distress, nonverbal, mildly agitated with staff during attempts to sit up or reposition Respiratory system: CTAB, no wheezes, rales or rhonchi, normal respiratory effort. Cardiovascular system: normal S1/S2, RRR, no pedal edema.   Gastrointestinal system:  soft, NT, ND, no HSM felt, +bowel sounds. Central nervous system: Unable to evaluate as patient does not follow commands Extremities: Pressure dressings in place over bony prominences on all extremities Skin: dry, intact, normal temperature   Labs   Data Reviewed: I have personally reviewed following labs and imaging studies  CBC: Recent Labs  Lab 07/31/21 0457  WBC 6.1  HGB 12.8  HCT 37.0  MCV 108.5*  PLT 299   Basic Metabolic Panel: Recent Labs  Lab 07/31/21 0457  NA 140  K 3.8  CL 109  CO2 27  GLUCOSE 90  BUN 22  CREATININE 0.65  CALCIUM 8.6*   GFR: Estimated Creatinine Clearance: 56.9 mL/min (by C-G formula based on SCr of 0.65 mg/dL). Liver Function Tests: No results for input(s): AST, ALT, ALKPHOS, BILITOT, PROT, ALBUMIN  in the last 168 hours. No results for input(s): LIPASE, AMYLASE in the last 168 hours. Recent Labs  Lab 08/03/21 1150  AMMONIA 41*   Coagulation Profile: No results for input(s): INR, PROTIME in the last 168 hours. Cardiac Enzymes: No results for input(s): CKTOTAL, CKMB, CKMBINDEX, TROPONINI in the last 168 hours. BNP (last 3 results) No results for input(s): PROBNP in the last 8760 hours. HbA1C: No results for input(s): HGBA1C in the last 72 hours. CBG: No results for input(s): GLUCAP in the last 168 hours. Lipid Profile: No results for input(s): CHOL, HDL, LDLCALC, TRIG, CHOLHDL, LDLDIRECT in the last 72 hours. Thyroid Function Tests: No results for input(s): TSH, T4TOTAL, FREET4, T3FREE, THYROIDAB in the last 72 hours. Anemia Panel: No results for input(s): VITAMINB12, FOLATE, FERRITIN, TIBC, IRON, RETICCTPCT in the last 72 hours. Sepsis Labs: No results for input(s): PROCALCITON, LATICACIDVEN in the last 168 hours.  Recent Results (from the past 240 hour(s))  Urine Culture     Status: Abnormal   Collection Time: 07/31/21  6:00 PM   Specimen: Urine, Catheterized  Result Value Ref Range Status   Specimen Description   Final     URINE, CATHETERIZED Performed at [facility], [address], [place], [place] [address]    Special Requests NONE  Final   Culture (A)  Final    <10,000 COLONIES/mL INSIGNIFICANT GROWTH Performed at [facility], [address], [place], [place] [address]    Report Status 08/01/2021 FINAL  Final  Resp Panel by RT-PCR (Flu A&B, Covid) Nasopharyngeal Swab     Status: None   Collection Time: 08/02/21 10:55 AM   Specimen: Nasopharyngeal Swab; Nasopharyngeal(NP) swabs in vial transport medium  Result Value Ref Range Status   SARS Coronavirus 2 by RT PCR NEGATIVE NEGATIVE Final    Comment: (NOTE) SARS-CoV-2 target nucleic acids are NOT DETECTED.  The SARS-CoV-2 RNA is generally detectable in upper respiratory specimens during the acute phase of infection. The lowest concentration of SARS-CoV-2 viral copies this assay can detect is 138 copies/mL. A negative result does not preclude SARS-Cov-2 infection and should not be used as the sole basis for treatment or other patient management decisions. A negative result may occur with  improper specimen collection/handling, submission of specimen other than nasopharyngeal swab, presence of viral mutation(s) within the areas targeted by this assay, and inadequate number of viral copies(<138 copies/mL). A negative result must be combined with clinical observations, patient history, and epidemiological information. The expected result is Negative.  Fact Sheet for Patients:  BloggerCourse.com  Fact Sheet for Healthcare Providers:  SeriousBroker.it  This test is no t yet approved or cleared by the [place] FDA and  has been authorized for detection and/or diagnosis of SARS-CoV-2 by FDA under an Emergency Use Authorization (EUA). This EUA will remain  in effect (meaning this test can be used) for the duration of the COVID-19 declaration under Section 564(b)(1) of the Act,  21 U.S.C.section 360bbb-3(b)(1), unless the authorization is terminated  or revoked sooner.       Influenza A by PCR NEGATIVE NEGATIVE Final   Influenza B by PCR NEGATIVE NEGATIVE Final    Comment: (NOTE) The Xpert Xpress SARS-CoV-2/FLU/RSV plus assay is intended as an aid in the diagnosis of influenza from Nasopharyngeal swab specimens and should not be used as a sole basis for treatment. Nasal washings and aspirates are unacceptable for Xpert Xpress SARS-CoV-2/FLU/RSV testing.  Fact Sheet for Patients: BloggerCourse.com  Fact Sheet for Healthcare Providers:  SeriousBroker.it  This test is not yet approved or cleared by the [place] and has been authorized for detection and/or diagnosis of SARS-CoV-2 by FDA under an Emergency Use Authorization (EUA). This EUA will remain in effect (meaning this test can be used) for the duration of the COVID-19 declaration under Section 564(b)(1) of the Act, 21 U.S.C. section 360bbb-3(b)(1), unless the authorization is terminated or revoked.  Performed at [facility], [address], [place], [place] [address]       Imaging Studies   No results found.   Medications   Scheduled Meds:  divalproex  500 mg Oral BID   enoxaparin (LOVENOX) injection  40 mg Subcutaneous Q24H   feeding supplement  1 Container Oral TID WC   feeding supplement  237 mL Oral BID BM   melatonin  5 mg Oral QHS   pantoprazole  40 mg Oral Daily   sodium chloride flush  10-40 mL Intracatheter Q12H   traZODone  100 mg Oral QHS   ziprasidone  20 mg Oral QHS   Continuous Infusions:     LOS: 61 days    Time spent: 30 minutes with > 50% spent at bedside and in coordination of care     Pennie Banter, DO [facility]  08/03/2021, 2:25 PM      If 7PM-7AM, please contact night-coverage. How to contact the [facility] Attending or Consulting provider 7A - 7P or covering provider  during after hours 7P -7A, for this patient?    Check the care team in [facility] and look for a) attending/consulting TRH provider listed and b) the [facility] team listed Log into [email or web address] and use [facility] universal password to access. If you do not have the password, please contact the hospital operator. Locate the [facility] provider you are looking for under [facility] and page to a number that you can be directly reached. If you still have difficulty reaching the provider, please page the [facility] (Director on Call) for the Hospitalists listed on amion for assistance.

## 2021-08-03 NOTE — Consult Note (Signed)
[facility] Face-to-Face Psychiatry Consult   Reason for Consult: Consult for 65 year old woman with a history of severe intellectual disability.  Reportedly mental status has declined even further recently Referring Physician: Denton Lank Patient Identification: Sabrina Johnston MRN:  [id number] Principal Diagnosis: Severe intellectual disability Diagnosis:  Principal Problem:   Severe intellectual disability Active Problems:   Generalized epilepsy (HCC)   Bacteremia due to Gram-negative bacteria   Seizure disorder (HCC)   Abnormal TSH   Pressure ulcer with abrasion, blister, partial thickness skin loss involving epidermis and/or dermis (HCC)   Insomnia   Lethargy   Total Time spent with patient: 45 minutes  Subjective:   Sabrina Johnston is a 65 y.o. female patient admitted with patient not able to give any information.  HPI: Patient is a 65 year old woman with severe intellectual disability who has been in the hospital for an extended stay for multiple medical problems including sepsis.  Staff reports that mental status has declined even more in the last several days.  She has become less responsive.  Sleeps most of the day.  Much more difficult to manage as far as eating and basic ADLs.  Not able to move or position in a way that would allow for head imaging.  I saw the patient in her room she was asleep curled up.  Set her name loudly multiple times close to her did not see even the slightest response.  Given that she is not able to communicate verbally at baseline did not see a reason to force her to wake up.  Nursing reports that she ate a small amount earlier today  Past Psychiatric History: Longstanding severe intellectual disability  Risk to Self:   Risk to Others:   Prior Inpatient Therapy:   Prior Outpatient Therapy:    Past Medical History:  Past Medical History:  Diagnosis Date   Menopause    Mental retardation    PMB (postmenopausal bleeding)     Past Surgical History:   Procedure Laterality Date   HYSTEROSCOPY WITH D & C  2013   b9 endometrium wiht polyps   Family History:  Family History  Family history unknown: Yes   Family Psychiatric  History: Unknown Social History:  Social History   Substance and Sexual Activity  Alcohol Use No     Social History   Substance and Sexual Activity  Drug Use No    Social History   Socioeconomic History   Marital status: Single    Spouse name: Not on file   Number of children: Not on file   Years of education: Not on file   Highest education level: Not on file  Occupational History   Not on file  Tobacco Use   Smoking status: Never   Smokeless tobacco: Never  Vaping Use   Vaping Use: Never used  Substance and Sexual Activity   Alcohol use: No   Drug use: No   Sexual activity: Never  Other Topics Concern   Not on file  Social History Narrative   Not on file   Social Determinants of Health   Financial Resource Strain: Not on file  Food Insecurity: Not on file  Transportation Needs: Not on file  Physical Activity: Not on file  Stress: Not on file  Social Connections: Not on file   Additional Social History:    Allergies:  No Known Allergies  Labs:  Results for orders placed or performed during the hospital encounter of 06/01/21 (from the past 48 hour(s))  Resp  Panel by RT-PCR (Flu A&B, Covid) Nasopharyngeal Swab     Status: None   Collection Time: 08/02/21 10:55 AM   Specimen: Nasopharyngeal Swab; Nasopharyngeal(NP) swabs in vial transport medium  Result Value Ref Range   SARS Coronavirus 2 by RT PCR NEGATIVE NEGATIVE    Comment: (NOTE) SARS-CoV-2 target nucleic acids are NOT DETECTED.  The SARS-CoV-2 RNA is generally detectable in upper respiratory specimens during the acute phase of infection. The lowest concentration of SARS-CoV-2 viral copies this assay can detect is 138 copies/mL. A negative result does not preclude SARS-Cov-2 infection and should not be used as the sole  basis for treatment or other patient management decisions. A negative result may occur with  improper specimen collection/handling, submission of specimen other than nasopharyngeal swab, presence of viral mutation(s) within the areas targeted by this assay, and inadequate number of viral copies(<138 copies/mL). A negative result must be combined with clinical observations, patient history, and epidemiological information. The expected result is Negative.  Fact Sheet for Patients:  BloggerCourse.com  Fact Sheet for Healthcare Providers:  SeriousBroker.it  This test is no t yet approved or cleared by the [place] FDA and  has been authorized for detection and/or diagnosis of SARS-CoV-2 by FDA under an Emergency Use Authorization (EUA). This EUA will remain  in effect (meaning this test can be used) for the duration of the COVID-19 declaration under Section 564(b)(1) of the Act, 21 U.S.C.section 360bbb-3(b)(1), unless the authorization is terminated  or revoked sooner.       Influenza A by PCR NEGATIVE NEGATIVE   Influenza B by PCR NEGATIVE NEGATIVE    Comment: (NOTE) The Xpert Xpress SARS-CoV-2/FLU/RSV plus assay is intended as an aid in the diagnosis of influenza from Nasopharyngeal swab specimens and should not be used as a sole basis for treatment. Nasal washings and aspirates are unacceptable for Xpert Xpress SARS-CoV-2/FLU/RSV testing.  Fact Sheet for Patients: BloggerCourse.com  Fact Sheet for Healthcare Providers: SeriousBroker.it  This test is not yet approved or cleared by the [place] FDA and has been authorized for detection and/or diagnosis of SARS-CoV-2 by FDA under an Emergency Use Authorization (EUA). This EUA will remain in effect (meaning this test can be used) for the duration of the COVID-19 declaration under Section 564(b)(1) of the Act, 21  U.S.C. section 360bbb-3(b)(1), unless the authorization is terminated or revoked.  Performed at [facility], [address] [place], [place] [address]   Ammonia     Status: Abnormal   Collection Time: 08/03/21 11:50 AM  Result Value Ref Range   Ammonia 41 (H) 9 - 35 umol/L    Comment: Performed at [facility], [address] [place], [place] [address]    Current Facility-Administered Medications  Medication Dose Route Frequency Provider Last Rate Last Admin   acetaminophen (TYLENOL) tablet 650 mg  650 mg Oral Q6H PRN Agbata, Tochukwu, MD   650 mg at 07/31/21 1619   Or   acetaminophen (TYLENOL) suppository 650 mg  650 mg Rectal Q6H PRN Agbata, Tochukwu, MD   650 mg at 06/02/21 1527   bisacodyl (DULCOLAX) suppository 10 mg  10 mg Rectal Daily PRN Pokhrel, Laxman, MD   10 mg at 07/27/21 2333   divalproex (DEPAKOTE SPRINKLE) capsule 500 mg  500 mg Oral BID Gillis Santa, MD   500 mg at 08/03/21 1004   enoxaparin (LOVENOX) injection 40 mg  40 mg Subcutaneous Q24H Charise Killian, MD   40 mg at 08/02/21 2259  feeding supplement (BOOST / RESOURCE BREEZE) liquid 1 Container  1 Container Oral TID WC Esaw Grandchild A, DO       feeding supplement (ENSURE ENLIVE / ENSURE PLUS) liquid 237 mL  237 mL Oral BID BM Esaw Grandchild A, DO   237 mL at 08/03/21 1429   hydrocortisone cream 1 % 1 application  1 application Topical TID PRN Pokhrel, Rebekah Chesterfield, MD   1 application at 07/09/21 2048   ibuprofen (ADVIL) 100 MG/5ML suspension 600 mg  600 mg Oral Q6H PRN Pokhrel, Laxman, MD   600 mg at 07/19/21 8657   melatonin tablet 5 mg  5 mg Oral QHS Gillis Santa, MD   5 mg at 08/02/21 2309   ondansetron (ZOFRAN) tablet 4 mg  4 mg Oral Q6H PRN Agbata, Tochukwu, MD       Or   ondansetron (ZOFRAN) injection 4 mg  4 mg Intravenous Q6H PRN Agbata, Tochukwu, MD       pantoprazole (PROTONIX) EC tablet 40 mg  40 mg Oral Daily Pokhrel, Laxman, MD   40 mg at 08/03/21 1004   sodium chloride  flush (NS) 0.9 % injection 10-40 mL  10-40 mL Intracatheter Q12H Rizwan, Ladell Heads, MD   10 mL at 08/03/21 1005   sodium chloride flush (NS) 0.9 % injection 10-40 mL  10-40 mL Intracatheter PRN Calvert Cantor, MD        Musculoskeletal: Strength & Muscle Tone: decreased Gait & Station: unable to stand Patient leans: N/A            Psychiatric Specialty Exam:  Presentation  General Appearance:  No data recorded Eye Contact: No data recorded Speech: No data recorded Speech Volume: No data recorded Handedness: No data recorded  Mood and Affect  Mood: No data recorded Affect: No data recorded  Thought Process  Thought Processes: No data recorded Descriptions of Associations:No data recorded Orientation:No data recorded Thought Content:No data recorded History of Schizophrenia/Schizoaffective disorder:No data recorded Duration of Psychotic Symptoms:No data recorded Hallucinations:No data recorded Ideas of Reference:No data recorded Suicidal Thoughts:No data recorded Homicidal Thoughts:No data recorded  Sensorium  Memory: No data recorded Judgment: No data recorded Insight: No data recorded  Executive Functions  Concentration: No data recorded Attention Span: No data recorded Recall: No data recorded Fund of Knowledge: No data recorded Language: No data recorded  Psychomotor Activity  Psychomotor Activity: No data recorded  Assets  Assets: No data recorded  Sleep  Sleep: No data recorded  Physical Exam: Physical Exam Vitals and nursing note reviewed.  Cardiovascular:     Rate and Rhythm: Normal rate.  Pulmonary:     Breath sounds: Normal breath sounds.  Skin:    General: Skin is warm and dry.   Review of Systems  Unable to perform ROS: Patient nonverbal  Blood pressure (!) 114/55, pulse 77, temperature 97.7 F (36.5 C), temperature source Axillary, resp. rate 17, height 5' (1.524 m), weight 56.9 kg, SpO2 99 %. Body mass index is  24.5 kg/m.  Treatment Plan Summary: Plan very difficult assessment given that even at baseline patient is nonverbal.  Vital signs have been stable.  Labs that have been drawn so far been unremarkable.  Patient is on only a small amount of psychiatric medicine or medicine that would be expected even be potentially sedating.  Depakote level was checked just a few days ago and was in the low therapeutic level.  I ordered an ammonia test today and that was only very slightly elevated not enough  to account at all for changes in mental status.  I am going to recommend discontinuing the trazodone and Geodon the last medicines other than her Depakote that she has been on that could be sedating.  Will follow up although it looks like she may be ready for discharge within the next day.  Disposition:  see note  Mordecai Rasmussen, MD 08/03/2021 4:50 PM

## 2021-08-03 NOTE — TOC Progression Note (Signed)
Transition of Care [facility]) - Progression Note    Patient Details  Name: Sabrina Johnston MRN: [id number] Date of Birth: [birth year]  [facility] CM/SW Contact  Allayne Butcher, RN Phone Number: 08/03/2021, 3:12 PM  Clinical Narrative:    Patient has been accepted at [facility] which is in [place].  They have  bed ready for her.  RNCM was unable to secure transport today as it is late in the afternoon and earliest transport tomorrow with [facility] was 2pm.  Facility reports that is too late for them to do an admission.  Facility does not accept admissions over the weekend.  Plan for Discharge on Monday.  [facility] to pick up at 10 am.     Expected Discharge Plan: Group Home Barriers to Discharge: Requiring sitter/restraints, Unsafe home situation  Expected Discharge Plan and Services Expected Discharge Plan: Group Home   Discharge Planning Services: CM Consult   Living arrangements for the past 2 months: Group Home                 DME Arranged: N/A DME Agency: NA       HH Arranged: NA           Social Determinants of Health (SDOH) Interventions    Readmission Risk Interventions Readmission Risk Prevention Plan 06/06/2021  Post Dischage Appt Complete  Medication Screening Complete  Transportation Screening Complete  Some recent data might be hidden

## 2021-08-04 NOTE — Progress Notes (Signed)
PROGRESS NOTE    Sabrina Johnston   OIZ:[id number]  DOB: Oct 26, 1955  PCP: Lauro Regulus, MD    DOA: 06/01/2021 LOS: 62    Brief Narrative / Hospital Course to Date:   65 year old female admitted on 06/01/2021 with agitation, aggressive behavior and multiple falls at her group facility.  She had just been hospitalized for 6 days and treated for sepsis due to E. coli infection, treated with IV antibiotics, discharged only a few hours prior to this presentation.  She underwent extensive neurologic evaluation that was essentially unremarkable for anything acute to explain her presentation.  Transitional care team looking into treatment options.  Past medical history of severe intellectual disability, nonverbal, epilepsy.  Patient has a recent treatment of gram-negative bacteremia.  Over the last few days has been more lethargic and not eating as well.  She does drink some apple juice and eats chocolate ice cream.   Assessment & Plan   Principal Problem:   Severe intellectual disability Active Problems:   Generalized epilepsy (HCC)   Bacteremia due to Gram-negative bacteria   Seizure disorder (HCC)   Abnormal TSH   Pressure ulcer with abrasion, blister, partial thickness skin loss involving epidermis and/or dermis (HCC)   Insomnia   Lethargy   Lethargy -past few days patient has been less interactive, sleeping more.   CT head was ordered but unable to be obtained due to patient not able to lie flat and still despite sedation.   UA was checked by and out cath sample and negative, culture with insignificant growth.   Chest x-ray was rotated and therefore poor quality but negative for pneumonia and patient does not have respiratory symptoms.   No leukocytosis. 10/20: Ammonia level minimally elevated at 41 --Continue to monitor --Delirium precautions --Feed patient when she is awake  Severe intellectual disability with intermittent agitation -due to recent lethargy, morning  Geodon has been stopped.  Continue nighttime trazodone, Geodon and Depakote.  Insomnia -on trazodone and Geodon at night  Seizure disorder -continue Depakote.  Last Depakote level normal.  Pressure injury of skin -present on admission --Continue wound care per instructions Pressure Injury 08/01/21 Arm Distal;Posterior;Right;Upper Stage 2 -  Partial thickness loss of dermis presenting as a shallow open injury with a red, pink wound bed without slough. red, pink, moist (Active)  08/01/21 0126  Location: Arm  Location Orientation: Distal;Posterior;Right;Upper  Staging: Stage 2 -  Partial thickness loss of dermis presenting as a shallow open injury with a red, pink wound bed without slough.  Wound Description (Comments): red, pink, moist  Present on Admission: No    Diarrhea -secondary to stool softeners/laxatives given due to history of chronic constipation.  These have been held.     Patient BMI: Body mass index is 24.5 kg/m.   DVT prophylaxis: enoxaparin (LOVENOX) injection 40 mg Start: 06/07/21 2200 Place and maintain sequential compression device Start: 06/07/21 1434   Diet:  Diet Orders (From admission, onward)     Start     Ordered   07/25/21 1554  DIET DYS 3 Room service appropriate? Yes with Assist; Fluid consistency: Thin  Diet effective now       Comments: Finger foods at meals; pt can have banana pudding per Speech ok.  Question Answer Comment  Room service appropriate? Yes with Assist   Fluid consistency: Thin      07/25/21 1554              Code Status: DNR   Subjective 08/04/21  Patient remains curled up in fetal position on her right side, sleeping comfortably.  Per sitter at bedside patient has not woken up to eat or drink anything today.  Dressing changes 1 to 2 weeks ago when patient was able to ambulate to the bathroom, feed herself and would engage and interact and play with staff.   Disposition Plan & Communication with   Status is:  Inpatient  Remains inpatient appropriate because: Long-term placement is pending.  Anticipate discharge to respite facility Monday 10/24  Family communication -spoke with [name], [name], by phone today 10/21  Consults, Procedures, Significant Events   Consultants:  Palliative care  Procedures:  None  Antimicrobials:  Anti-infectives (From admission, onward)    Start     Dose/Rate Route Frequency Ordered Stop   06/04/21 1000  cephALEXin (KEFLEX) capsule 500 mg        500 mg Oral 4 times daily 06/04/21 0811 06/08/21 2329   06/02/21 1000  cefTRIAXone (ROCEPHIN) 2 g in sodium chloride 0.9 % 100 mL IVPB  Status:  Discontinued        2 g 200 mL/hr over 30 Minutes Intravenous Every 24 hours 06/02/21 0950 06/04/21 0810   06/02/21 0345  cefTRIAXone (ROCEPHIN) 1 g in sodium chloride 0.9 % 100 mL IVPB        1 g 200 mL/hr over 30 Minutes Intravenous  Once 06/02/21 0330 06/02/21 0452   06/01/21 2200  cefTRIAXone (ROCEPHIN) 1 g in sodium chloride 0.9 % 100 mL IVPB        1 g 200 mL/hr over 30 Minutes Intravenous  Once 06/01/21 2150 06/02/21 0039         Micro    Objective   Vitals:   08/03/21 2153 08/04/21 0448 08/04/21 0811 08/04/21 1149  BP: 119/66 122/70 128/81 121/70  Pulse: 89 83 83 77  Resp: 18 16 16 18   Temp: 97.6 F (36.4 C) (!) 97.5 F (36.4 C) 98.2 F (36.8 C) 98 F (36.7 C)  TempSrc: Oral Oral Oral Oral  SpO2: 98% 97% 98% 98%  Weight:      Height:        Intake/Output Summary (Last 24 hours) at 08/04/2021 1413 Last data filed at 08/04/2021 1401 Gross per 24 hour  Intake 0 ml  Output --  Net 0 ml   Filed Weights   07/15/21 0824 07/21/21 2054 08/02/21 1158  Weight: 60.4 kg 63 kg 56.9 kg    Physical Exam:  General exam: Sleeping comfortably, on right side in fetal position, no acute distress Respiratory system: Lungs clear bilaterally, normal respiratory effort, on room air Cardiovascular system: Regular rate and rhythm, no peripheral  edema, S1/S2 + Central nervous system: Unable to evaluate as patient does awake and or follow commands Extremities: Pressure dressings in place over bony prominences on all extremities Skin: dry, intact, normal temperature   Labs   Data Reviewed: I have personally reviewed following labs and imaging studies  CBC: Recent Labs  Lab 07/31/21 0457  WBC 6.1  HGB 12.8  HCT 37.0  MCV 108.5*  PLT 299   Basic Metabolic Panel: Recent Labs  Lab 07/31/21 0457  NA 140  K 3.8  CL 109  CO2 27  GLUCOSE 90  BUN 22  CREATININE 0.65  CALCIUM 8.6*   GFR: Estimated Creatinine Clearance: 56.9 mL/min (by C-G formula based on SCr of 0.65 mg/dL). Liver Function Tests: No results for input(s): AST, ALT, ALKPHOS, BILITOT, PROT, ALBUMIN in the last 168 hours.  No results for input(s): LIPASE, AMYLASE in the last 168 hours. Recent Labs  Lab 08/03/21 1150  AMMONIA 41*   Coagulation Profile: No results for input(s): INR, PROTIME in the last 168 hours. Cardiac Enzymes: No results for input(s): CKTOTAL, CKMB, CKMBINDEX, TROPONINI in the last 168 hours. BNP (last 3 results) No results for input(s): PROBNP in the last 8760 hours. HbA1C: No results for input(s): HGBA1C in the last 72 hours. CBG: No results for input(s): GLUCAP in the last 168 hours. Lipid Profile: No results for input(s): CHOL, HDL, LDLCALC, TRIG, CHOLHDL, LDLDIRECT in the last 72 hours. Thyroid Function Tests: No results for input(s): TSH, T4TOTAL, FREET4, T3FREE, THYROIDAB in the last 72 hours. Anemia Panel: No results for input(s): VITAMINB12, FOLATE, FERRITIN, TIBC, IRON, RETICCTPCT in the last 72 hours. Sepsis Labs: No results for input(s): PROCALCITON, LATICACIDVEN in the last 168 hours.  Recent Results (from the past 240 hour(s))  Urine Culture     Status: Abnormal   Collection Time: 07/31/21  6:00 PM   Specimen: Urine, Catheterized  Result Value Ref Range Status   Specimen Description   Final    URINE,  CATHETERIZED Performed at [facility], [address], [place], [place] [address]    Special Requests NONE  Final   Culture (A)  Final    <10,000 COLONIES/mL INSIGNIFICANT GROWTH Performed at [facility], [address], [place], [place] [address]    Report Status 08/01/2021 FINAL  Final  Resp Panel by RT-PCR (Flu A&B, Covid) Nasopharyngeal Swab     Status: None   Collection Time: 08/02/21 10:55 AM   Specimen: Nasopharyngeal Swab; Nasopharyngeal(NP) swabs in vial transport medium  Result Value Ref Range Status   SARS Coronavirus 2 by RT PCR NEGATIVE NEGATIVE Final    Comment: (NOTE) SARS-CoV-2 target nucleic acids are NOT DETECTED.  The SARS-CoV-2 RNA is generally detectable in upper respiratory specimens during the acute phase of infection. The lowest concentration of SARS-CoV-2 viral copies this assay can detect is 138 copies/mL. A negative result does not preclude SARS-Cov-2 infection and should not be used as the sole basis for treatment or other patient management decisions. A negative result may occur with  improper specimen collection/handling, submission of specimen other than nasopharyngeal swab, presence of viral mutation(s) within the areas targeted by this assay, and inadequate number of viral copies(<138 copies/mL). A negative result must be combined with clinical observations, patient history, and epidemiological information. The expected result is Negative.  Fact Sheet for Patients:  BloggerCourse.com  Fact Sheet for Healthcare Providers:  SeriousBroker.it  This test is no t yet approved or cleared by the [place] FDA and  has been authorized for detection and/or diagnosis of SARS-CoV-2 by FDA under an Emergency Use Authorization (EUA). This EUA will remain  in effect (meaning this test can be used) for the duration of the COVID-19 declaration under Section 564(b)(1) of the Act,  21 U.S.C.section 360bbb-3(b)(1), unless the authorization is terminated  or revoked sooner.       Influenza A by PCR NEGATIVE NEGATIVE Final   Influenza B by PCR NEGATIVE NEGATIVE Final    Comment: (NOTE) The Xpert Xpress SARS-CoV-2/FLU/RSV plus assay is intended as an aid in the diagnosis of influenza from Nasopharyngeal swab specimens and should not be used as a sole basis for treatment. Nasal washings and aspirates are unacceptable for Xpert Xpress SARS-CoV-2/FLU/RSV testing.  Fact Sheet for Patients: BloggerCourse.com  Fact Sheet for Healthcare Providers: SeriousBroker.it  This test is  not yet approved or cleared by the [place] and has been authorized for detection and/or diagnosis of SARS-CoV-2 by FDA under an Emergency Use Authorization (EUA). This EUA will remain in effect (meaning this test can be used) for the duration of the COVID-19 declaration under Section 564(b)(1) of the Act, 21 U.S.C. section 360bbb-3(b)(1), unless the authorization is terminated or revoked.  Performed at [facility], [address], [place], [place] [address]       Imaging Studies   No results found.   Medications   Scheduled Meds:  divalproex  500 mg Oral BID   enoxaparin (LOVENOX) injection  40 mg Subcutaneous Q24H   feeding supplement  1 Container Oral TID WC   feeding supplement  237 mL Oral BID BM   melatonin  5 mg Oral QHS   pantoprazole  40 mg Oral Daily   sodium chloride flush  10-40 mL Intracatheter Q12H   Continuous Infusions:     LOS: 62 days    Time spent: 30 minutes with > 50% spent at bedside and in coordination of care     Pennie Banter, DO [facility]  08/04/2021, 2:13 PM      If 7PM-7AM, please contact night-coverage. How to contact the [facility] Attending or Consulting provider 7A - 7P or covering provider during after hours 7P -7A, for this patient?    Check the care  team in [facility] and look for a) attending/consulting [facility] provider listed and b) the [facility] team listed Log into [email or web address] and use [place]'s universal password to access. If you do not have the password, please contact the hospital operator. Locate the [facility] provider you are looking for under [facility] and page to a number that you can be directly reached. If you still have difficulty reaching the provider, please page the [facility] (Director on Call) for the Hospitalists listed on amion for assistance.

## 2021-08-04 NOTE — Care Management Important Message (Signed)
Important Message  Patient Details  Name: Sabrina Johnston MRN: [id number] Date of Birth: 06/01/1956   Medicare Important Message Given:  Other (see comment)  I talked with patient's legal guardian, Sabrina Johnston (sister) [phone] and reviewed the Important Message from Medicare. She is in agreement with the planned discharge on Monday morning. I asked if she would like a copy of the form and she replied yes. I told her I would send it securely and thanked her for time.  Sabrina Johnston 08/04/2021, 9:54 AM

## 2021-08-05 LAB — CBC
HCT: 45.4 % (ref 36.0–46.0)
Hemoglobin: 15.2 g/dL — ABNORMAL HIGH (ref 12.0–15.0)
MCH: 35.8 pg — ABNORMAL HIGH (ref 26.0–34.0)
MCHC: 33.5 g/dL (ref 30.0–36.0)
MCV: 106.8 fL — ABNORMAL HIGH (ref 80.0–100.0)
Platelets: 223 10*3/uL (ref 150–400)
RBC: 4.25 MIL/uL (ref 3.87–5.11)
RDW: 12.6 % (ref 11.5–15.5)
WBC: 5.9 10*3/uL (ref 4.0–10.5)
nRBC: 0 % (ref 0.0–0.2)

## 2021-08-05 LAB — BASIC METABOLIC PANEL
Anion gap: 6 (ref 5–15)
BUN: 24 mg/dL — ABNORMAL HIGH (ref 8–23)
CO2: 27 mmol/L (ref 22–32)
Calcium: 9.3 mg/dL (ref 8.9–10.3)
Chloride: 109 mmol/L (ref 98–111)
Creatinine, Ser: 0.7 mg/dL (ref 0.44–1.00)
GFR, Estimated: >60 mL/min (ref >60)
Glucose, Bld: 86 mg/dL (ref 70–99)
Potassium: 4.1 mmol/L (ref 3.5–5.1)
Sodium: 142 mmol/L (ref 135–145)

## 2021-08-05 NOTE — Progress Notes (Addendum)
PROGRESS NOTE    Sabrina Johnston   JOA:[id number]  DOB: 08-11-1956  PCP: Lauro Regulus, MD    DOA: 06/01/2021 LOS: 34    Brief Narrative / Hospital Course to Date:   65 year old female admitted on 06/01/2021 with agitation, aggressive behavior and multiple falls at her group facility.  She had just been hospitalized for 6 days and treated for sepsis due to E. coli infection, treated with IV antibiotics, discharged only a few hours prior to this presentation.  She underwent extensive neurologic evaluation that was essentially unremarkable for anything acute to explain her presentation.  Transitional care team looking into treatment options.  Past medical history of severe intellectual disability, nonverbal, epilepsy.  Patient has a recent treatment of gram-negative bacteremia.  Over the last few days has been more lethargic and not eating as well.  She does drink some apple juice and eats chocolate ice cream.   Assessment & Plan   Principal Problem:   Severe intellectual disability Active Problems:   Generalized epilepsy (HCC)   Bacteremia due to Gram-negative bacteria   Seizure disorder (HCC)   Abnormal TSH   Pressure ulcer with abrasion, blister, partial thickness skin loss involving epidermis and/or dermis (HCC)   Insomnia   Lethargy   Lethargy -past few days patient has been less interactive, sleeping more.   CT head was ordered but unable to be obtained due to patient not able to lie flat and still despite sedation.   UA was checked by and out cath sample and negative, culture with insignificant growth.   Chest x-ray was rotated and therefore poor quality but negative for pneumonia and patient does not have respiratory symptoms.   No leukocytosis. 10/20: Ammonia level minimally elevated at 41 --Stop nighttime trazodone and Geodon and monitor for improved alertness 10/22: Patient this morning awake and actually ate breakfast, improved --Continue to monitor --Delirium  precautions --Feed patient when she is awake  Severe intellectual disability with intermittent agitation -due to recent lethargy, morning Geodon has been stopped.  Nighttime trazodone and Geodon were later stopped 10/20 due to persistent lethargy, patient barely waking up or eating/drinking. --Continue Depakote.  Insomnia -on trazodone and Geodon at night  Seizure disorder -continue Depakote.  Last Depakote level normal.  Pressure injury of skin -present on admission --Continue wound care per instructions Pressure Injury 08/01/21 Arm Distal;Posterior;Right;Upper Stage 2 -  Partial thickness loss of dermis presenting as a shallow open injury with a red, pink wound bed without slough. red, pink, moist (Active)  08/01/21 0126  Location: Arm  Location Orientation: Distal;Posterior;Right;Upper  Staging: Stage 2 -  Partial thickness loss of dermis presenting as a shallow open injury with a red, pink wound bed without slough.  Wound Description (Comments): red, pink, moist  Present on Admission: No    Diarrhea -secondary to stool softeners/laxatives given due to history of chronic constipation.  These have been held.     Patient BMI: Body mass index is 24.5 kg/m.   DVT prophylaxis: enoxaparin (LOVENOX) injection 40 mg Start: 06/07/21 2200 Place and maintain sequential compression device Start: 06/07/21 1434   Diet:  Diet Orders (From admission, onward)     Start     Ordered   07/25/21 1554  DIET DYS 3 Room service appropriate? Yes with Assist; Fluid consistency: Thin  Diet effective now       Comments: Finger foods at meals; pt can have banana pudding per Speech ok.  Question Answer Comment  Room service appropriate? Yes  with Assist   Fluid consistency: Thin      07/25/21 1554              Code Status: DNR   Subjective 08/05/21    Patient is more awake this morning and actually ate breakfast.  Sitter at bedside reports a little more interactive than in the past several  days.  Patient is actually awake and moving around in the bed somewhat but still prefers to be on her right side despite attempts at repositioning.  No acute events reported.   Disposition Plan & Communication with   Status is: Inpatient  Remains inpatient appropriate because: Long-term placement is pending.  Anticipate discharge to respite facility Monday 10/24  Family communication -spoke with [name], [name], by phone 10/21  Consults, Procedures, Significant Events   Consultants:  Palliative care  Procedures:  None  Antimicrobials:  Anti-infectives (From admission, onward)    Start     Dose/Rate Route Frequency Ordered Stop   06/04/21 1000  cephALEXin (KEFLEX) capsule 500 mg        500 mg Oral 4 times daily 06/04/21 0811 06/08/21 2329   06/02/21 1000  cefTRIAXone (ROCEPHIN) 2 g in sodium chloride 0.9 % 100 mL IVPB  Status:  Discontinued        2 g 200 mL/hr over 30 Minutes Intravenous Every 24 hours 06/02/21 0950 06/04/21 0810   06/02/21 0345  cefTRIAXone (ROCEPHIN) 1 g in sodium chloride 0.9 % 100 mL IVPB        1 g 200 mL/hr over 30 Minutes Intravenous  Once 06/02/21 0330 06/02/21 0452   06/01/21 2200  cefTRIAXone (ROCEPHIN) 1 g in sodium chloride 0.9 % 100 mL IVPB        1 g 200 mL/hr over 30 Minutes Intravenous  Once 06/01/21 2150 06/02/21 0039         Micro    Objective   Vitals:   08/05/21 0523 08/05/21 0717 08/05/21 0721 08/05/21 1211  BP: 124/74 132/69 132/69 128/68  Pulse: 72 78 78 85  Resp: 16 16  16   Temp: (!) 97.5 F (36.4 C) 97.7 F (36.5 C) 97.7 F (36.5 C) 98.5 F (36.9 C)  TempSrc: Oral Oral Axillary Axillary  SpO2: 100% 93%  100%  Weight:      Height:        Intake/Output Summary (Last 24 hours) at 08/05/2021 1312 Last data filed at 08/05/2021 0900 Gross per 24 hour  Intake 300 ml  Output --  Net 300 ml   Filed Weights   07/15/21 0824 07/21/21 2054 08/02/21 1158  Weight: 60.4 kg 63 kg 56.9 kg    Physical  Exam:  General exam: awake, moving around in bed, on right side in fetal position, no acute distress Respiratory system: normal respiratory effort, on room air Cardiovascular system: Regular rate and rhythm, no peripheral edema Central nervous system: Unable to evaluate as patient does not follow commands Extremities: Pressure dressings in place over bony prominences on all extremities Skin: dry, intact, normal temperature   Labs   Data Reviewed: I have personally reviewed following labs and imaging studies  CBC: Recent Labs  Lab 07/31/21 0457 08/05/21 0349  WBC 6.1 5.9  HGB 12.8 15.2*  HCT 37.0 45.4  MCV 108.5* 106.8*  PLT 299 223   Basic Metabolic Panel: Recent Labs  Lab 07/31/21 0457 08/05/21 0349  NA 140 142  K 3.8 4.1  CL 109 109  CO2 27 27  GLUCOSE 90 86  BUN 22 24*  CREATININE 0.65 0.70  CALCIUM 8.6* 9.3   GFR: Estimated Creatinine Clearance: 56.9 mL/min (by C-G formula based on SCr of 0.7 mg/dL). Liver Function Tests: No results for input(s): AST, ALT, ALKPHOS, BILITOT, PROT, ALBUMIN in the last 168 hours. No results for input(s): LIPASE, AMYLASE in the last 168 hours. Recent Labs  Lab 08/03/21 1150  AMMONIA 41*   Coagulation Profile: No results for input(s): INR, PROTIME in the last 168 hours. Cardiac Enzymes: No results for input(s): CKTOTAL, CKMB, CKMBINDEX, TROPONINI in the last 168 hours. BNP (last 3 results) No results for input(s): PROBNP in the last 8760 hours. HbA1C: No results for input(s): HGBA1C in the last 72 hours. CBG: No results for input(s): GLUCAP in the last 168 hours. Lipid Profile: No results for input(s): CHOL, HDL, LDLCALC, TRIG, CHOLHDL, LDLDIRECT in the last 72 hours. Thyroid Function Tests: No results for input(s): TSH, T4TOTAL, FREET4, T3FREE, THYROIDAB in the last 72 hours. Anemia Panel: No results for input(s): VITAMINB12, FOLATE, FERRITIN, TIBC, IRON, RETICCTPCT in the last 72 hours. Sepsis Labs: No results for  input(s): PROCALCITON, LATICACIDVEN in the last 168 hours.  Recent Results (from the past 240 hour(s))  Urine Culture     Status: Abnormal   Collection Time: 07/31/21  6:00 PM   Specimen: Urine, Catheterized  Result Value Ref Range Status   Specimen Description   Final    URINE, CATHETERIZED Performed at [facility], [address], [place], [place] [address]    Special Requests NONE  Final   Culture (A)  Final    <10,000 COLONIES/mL INSIGNIFICANT GROWTH Performed at [facility], [address], [place], [place] [address]    Report Status 08/01/2021 FINAL  Final  Resp Panel by RT-PCR (Flu A&B, Covid) Nasopharyngeal Swab     Status: None   Collection Time: 08/02/21 10:55 AM   Specimen: Nasopharyngeal Swab; Nasopharyngeal(NP) swabs in vial transport medium  Result Value Ref Range Status   SARS Coronavirus 2 by RT PCR NEGATIVE NEGATIVE Final    Comment: (NOTE) SARS-CoV-2 target nucleic acids are NOT DETECTED.  The SARS-CoV-2 RNA is generally detectable in upper respiratory specimens during the acute phase of infection. The lowest concentration of SARS-CoV-2 viral copies this assay can detect is 138 copies/mL. A negative result does not preclude SARS-Cov-2 infection and should not be used as the sole basis for treatment or other patient management decisions. A negative result may occur with  improper specimen collection/handling, submission of specimen other than nasopharyngeal swab, presence of viral mutation(s) within the areas targeted by this assay, and inadequate number of viral copies(<138 copies/mL). A negative result must be combined with clinical observations, patient history, and epidemiological information. The expected result is Negative.  Fact Sheet for Patients:  BloggerCourse.com  Fact Sheet for Healthcare Providers:  SeriousBroker.it  This test is no t yet approved or cleared by the [place] FDA and  has been authorized for detection and/or diagnosis of SARS-CoV-2 by FDA under an Emergency Use Authorization (EUA). This EUA will remain  in effect (meaning this test can be used) for the duration of the COVID-19 declaration under Section 564(b)(1) of the Act, 21 U.S.C.section 360bbb-3(b)(1), unless the authorization is terminated  or revoked sooner.       Influenza A by PCR NEGATIVE NEGATIVE Final   Influenza B by PCR NEGATIVE NEGATIVE Final    Comment: (NOTE) The Xpert Xpress SARS-CoV-2/FLU/RSV plus assay is intended as an aid in the  diagnosis of influenza from Nasopharyngeal swab specimens and should not be used as a sole basis for treatment. Nasal washings and aspirates are unacceptable for Xpert Xpress SARS-CoV-2/FLU/RSV testing.  Fact Sheet for Patients: BloggerCourse.com  Fact Sheet for Healthcare Providers: SeriousBroker.it  This test is not yet approved or cleared by the [place] FDA and has been authorized for detection and/or diagnosis of SARS-CoV-2 by FDA under an Emergency Use Authorization (EUA). This EUA will remain in effect (meaning this test can be used) for the duration of the COVID-19 declaration under Section 564(b)(1) of the Act, 21 U.S.C. section 360bbb-3(b)(1), unless the authorization is terminated or revoked.  Performed at [facility], [address] [place], [place] [address]       Imaging Studies   No results found.   Medications   Scheduled Meds:  divalproex  500 mg Oral BID   enoxaparin (LOVENOX) injection  40 mg Subcutaneous Q24H   feeding supplement  1 Container Oral TID WC   feeding supplement  237 mL Oral BID BM   melatonin  5 mg Oral QHS   pantoprazole  40 mg Oral Daily   sodium chloride flush  10-40 mL Intracatheter Q12H   Continuous Infusions:     LOS: 63 days    Time spent: 20 minutes     Pennie Banter, DO [facility]  08/05/2021, 1:12 PM      If 7PM-7AM, please contact night-coverage. How to contact the [facility] Attending or Consulting provider 7A - 7P or covering provider during after hours 7P -7A, for this patient?    Check the care team in [facility] and look for a) attending/consulting TR[facility] provider listed and b) the [facility] team listed Log into [email or web address] and use [facility]'s universal password to access. If you do not have the password, please contact the hospital operator. Locate the [facility] provider you are looking for under [facility] and page to a number that you can be directly reached. If you still have difficulty reaching the provider, please page the [facility] (Director on Call) for the Hospitalists listed on amion for assistance.

## 2021-08-06 NOTE — Progress Notes (Signed)
PROGRESS NOTE    Sabrina Johnston   RWE:[id number]  DOB: [birth year]  PCP: Lauro Regulus, MD    DOA: 06/01/2021 LOS: 22    Brief Narrative / Hospital Course to Date:   65 year old female admitted on 06/01/2021 with agitation, aggressive behavior and multiple falls at her group facility.  She had just been hospitalized for 6 days and treated for sepsis due to E. coli infection, treated with IV antibiotics, discharged only a few hours prior to this presentation.  She underwent extensive neurologic evaluation that was essentially unremarkable for anything acute to explain her presentation.  Transitional care team looking into treatment options.  Past medical history of severe intellectual disability, nonverbal, epilepsy.  Patient has a recent treatment of gram-negative bacteremia.  Over the last few days has been more lethargic and not eating as well.  She does drink some apple juice and eats chocolate ice cream.   Assessment & Plan   Principal Problem:   Severe intellectual disability Active Problems:   Generalized epilepsy (HCC)   Bacteremia due to Gram-negative bacteria   Seizure disorder (HCC)   Abnormal TSH   Pressure ulcer with abrasion, blister, partial thickness skin loss involving epidermis and/or dermis (HCC)   Insomnia   Lethargy   Lethargy -past few days patient has been less interactive, sleeping more.   CT head was ordered but unable to be obtained due to patient not able to lie flat and still despite sedation.   UA was checked by and out cath sample and negative, culture with insignificant growth.   Chest x-ray was rotated and therefore poor quality but negative for pneumonia and patient does not have respiratory symptoms.   No leukocytosis. 10/20: Ammonia level minimally elevated at 41 --Stop nighttime trazodone and Geodon and monitor for improved alertness 10/22: Patient this morning awake and actually ate breakfast, improved --Continue to monitor --Delirium  precautions --Feed patient when she is awake  Severe intellectual disability with intermittent agitation -due to recent lethargy, morning Geodon has been stopped.  Nighttime trazodone and Geodon were later stopped 10/20 due to persistent lethargy, patient barely waking up or eating/drinking. --Continue Depakote.  Insomnia -on trazodone and Geodon at night  Seizure disorder -continue Depakote.  Last Depakote level normal.  Pressure injury of skin -present on admission --Continue wound care per instructions Pressure Injury 08/01/21 Arm Distal;Posterior;Right;Upper Stage 2 -  Partial thickness loss of dermis presenting as a shallow open injury with a red, pink wound bed without slough. red, pink, moist (Active)  08/01/21 0126  Location: Arm  Location Orientation: Distal;Posterior;Right;Upper  Staging: Stage 2 -  Partial thickness loss of dermis presenting as a shallow open injury with a red, pink wound bed without slough.  Wound Description (Comments): red, pink, moist  Present on Admission: No    Diarrhea -secondary to stool softeners/laxatives given due to history of chronic constipation.  These have been held.     Patient BMI: Body mass index is 24.5 kg/m.   DVT prophylaxis: enoxaparin (LOVENOX) injection 40 mg Start: 06/07/21 2200 Place and maintain sequential compression device Start: 06/07/21 1434   Diet:  Diet Orders (From admission, onward)     Start     Ordered   07/25/21 1554  DIET DYS 3 Room service appropriate? Yes with Assist; Fluid consistency: Thin  Diet effective now       Comments: Finger foods at meals; pt can have banana pudding per Speech ok.  Question Answer Comment  Room service appropriate? Yes  with Assist   Fluid consistency: Thin      07/25/21 1554              Code Status: DNR   Subjective 08/06/21    Patient just waking up per sitter at bedside.  Had not yet eaten breakfast.  Looks like she has been a little bit more awake and eating  better since being taken off trazodone and Geodon and attempt to improve her persistent lethargy.  No report of agitation or behavioral issues overnight.  She did refuse vital signs however this morning.   Disposition Plan & Communication with   Status is: Inpatient  Remains inpatient appropriate because: Long-term placement is pending.  Anticipate discharge to respite facility Monday 10/24  Family communication -spoke with [name], [name], by phone 10/21  Consults, Procedures, Significant Events   Consultants:  Palliative care  Procedures:  None  Antimicrobials:  Anti-infectives (From admission, onward)    Start     Dose/Rate Route Frequency Ordered Stop   06/04/21 1000  cephALEXin (KEFLEX) capsule 500 mg        500 mg Oral 4 times daily 06/04/21 0811 06/08/21 2329   06/02/21 1000  cefTRIAXone (ROCEPHIN) 2 g in sodium chloride 0.9 % 100 mL IVPB  Status:  Discontinued        2 g 200 mL/hr over 30 Minutes Intravenous Every 24 hours 06/02/21 0950 06/04/21 0810   06/02/21 0345  cefTRIAXone (ROCEPHIN) 1 g in sodium chloride 0.9 % 100 mL IVPB        1 g 200 mL/hr over 30 Minutes Intravenous  Once 06/02/21 0330 06/02/21 0452   06/01/21 2200  cefTRIAXone (ROCEPHIN) 1 g in sodium chloride 0.9 % 100 mL IVPB        1 g 200 mL/hr over 30 Minutes Intravenous  Once 06/01/21 2150 06/02/21 0039         Micro    Objective   Vitals:   08/05/21 1211 08/05/21 1602 08/05/21 1900 08/06/21 1010  BP: 128/68 100/72 (!) 122/99 108/66  Pulse: 85 81 99 72  Resp: 16 17 20 16   Temp: 98.5 F (36.9 C) 98.1 F (36.7 C)  98.1 F (36.7 C)  TempSrc: Axillary Axillary    SpO2: 100% 97%    Weight:      Height:        Intake/Output Summary (Last 24 hours) at 08/06/2021 1400 Last data filed at 08/05/2021 1845 Gross per 24 hour  Intake 0 ml  Output --  Net 0 ml   Filed Weights   07/15/21 0824 07/21/21 2054 08/02/21 1158  Weight: 60.4 kg 63 kg 56.9 kg    Physical  Exam:  General exam: awake, laying on right side, sideways in bed, no acute distress Respiratory system: lungs CTAB, normal respiratory effort, on room air Cardiovascular system: Regular rate and rhythm, no peripheral edema Central nervous system: Unable to evaluate as patient does not follow commands Extremities: Pressure dressings in place over bony prominences on all extremities Skin: dry, intact, normal temperature   Labs   Data Reviewed: I have personally reviewed following labs and imaging studies  CBC: Recent Labs  Lab 07/31/21 0457 08/05/21 0349  WBC 6.1 5.9  HGB 12.8 15.2*  HCT 37.0 45.4  MCV 108.5* 106.8*  PLT 299 223   Basic Metabolic Panel: Recent Labs  Lab 07/31/21 0457 08/05/21 0349  NA 140 142  K 3.8 4.1  CL 109 109  CO2 27 27  GLUCOSE 90  86  BUN 22 24*  CREATININE 0.65 0.70  CALCIUM 8.6* 9.3   GFR: Estimated Creatinine Clearance: 56.9 mL/min (by C-G formula based on SCr of 0.7 mg/dL). Liver Function Tests: No results for input(s): AST, ALT, ALKPHOS, BILITOT, PROT, ALBUMIN in the last 168 hours. No results for input(s): LIPASE, AMYLASE in the last 168 hours. Recent Labs  Lab 08/03/21 1150  AMMONIA 41*   Coagulation Profile: No results for input(s): INR, PROTIME in the last 168 hours. Cardiac Enzymes: No results for input(s): CKTOTAL, CKMB, CKMBINDEX, TROPONINI in the last 168 hours. BNP (last 3 results) No results for input(s): PROBNP in the last 8760 hours. HbA1C: No results for input(s): HGBA1C in the last 72 hours. CBG: No results for input(s): GLUCAP in the last 168 hours. Lipid Profile: No results for input(s): CHOL, HDL, LDLCALC, TRIG, CHOLHDL, LDLDIRECT in the last 72 hours. Thyroid Function Tests: No results for input(s): TSH, T4TOTAL, FREET4, T3FREE, THYROIDAB in the last 72 hours. Anemia Panel: No results for input(s): VITAMINB12, FOLATE, FERRITIN, TIBC, IRON, RETICCTPCT in the last 72 hours. Sepsis Labs: No results for  input(s): PROCALCITON, LATICACIDVEN in the last 168 hours.  Recent Results (from the past 240 hour(s))  Urine Culture     Status: Abnormal   Collection Time: 07/31/21  6:00 PM   Specimen: Urine, Catheterized  Result Value Ref Range Status   Specimen Description   Final    URINE, CATHETERIZED Performed at [facility], [address] [place], [place] [address]    Special Requests NONE  Final   Culture (A)  Final    <10,000 COLONIES/mL INSIGNIFICANT GROWTH Performed at [facility], [address], [place], [place] [address]    Report Status 08/01/2021 FINAL  Final  Resp Panel by RT-PCR (Flu A&B, Covid) Nasopharyngeal Swab     Status: None   Collection Time: 08/02/21 10:55 AM   Specimen: Nasopharyngeal Swab; Nasopharyngeal(NP) swabs in vial transport medium  Result Value Ref Range Status   SARS Coronavirus 2 by RT PCR NEGATIVE NEGATIVE Final    Comment: (NOTE) SARS-CoV-2 target nucleic acids are NOT DETECTED.  The SARS-CoV-2 RNA is generally detectable in upper respiratory specimens during the acute phase of infection. The lowest concentration of SARS-CoV-2 viral copies this assay can detect is 138 copies/mL. A negative result does not preclude SARS-Cov-2 infection and should not be used as the sole basis for treatment or other patient management decisions. A negative result may occur with  improper specimen collection/handling, submission of specimen other than nasopharyngeal swab, presence of viral mutation(s) within the areas targeted by this assay, and inadequate number of viral copies(<138 copies/mL). A negative result must be combined with clinical observations, patient history, and epidemiological information. The expected result is Negative.  Fact Sheet for Patients:  BloggerCourse.com  Fact Sheet for Healthcare Providers:  SeriousBroker.it  This test is no t yet approved or cleared by the [place] FDA and  has been authorized for detection and/or diagnosis of SARS-CoV-2 by FDA under an Emergency Use Authorization (EUA). This EUA will remain  in effect (meaning this test can be used) for the duration of the COVID-19 declaration under Section 564(b)(1) of the Act, 21 U.S.C.section 360bbb-3(b)(1), unless the authorization is terminated  or revoked sooner.       Influenza A by PCR NEGATIVE NEGATIVE Final   Influenza B by PCR NEGATIVE NEGATIVE Final    Comment: (NOTE) The Xpert Xpress SARS-CoV-2/FLU/RSV plus assay is intended as an aid  in the diagnosis of influenza from Nasopharyngeal swab specimens and should not be used as a sole basis for treatment. Nasal washings and aspirates are unacceptable for Xpert Xpress SARS-CoV-2/FLU/RSV testing.  Fact Sheet for Patients: BloggerCourse.com  Fact Sheet for Healthcare Providers: SeriousBroker.it  This test is not yet approved or cleared by the [place] FDA and has been authorized for detection and/or diagnosis of SARS-CoV-2 by FDA under an Emergency Use Authorization (EUA). This EUA will remain in effect (meaning this test can be used) for the duration of the COVID-19 declaration under Section 564(b)(1) of the Act, 21 U.S.C. section 360bbb-3(b)(1), unless the authorization is terminated or revoked.  Performed at [facility], [address], [place], [place] [address]       Imaging Studies   No results found.   Medications   Scheduled Meds:  divalproex  500 mg Oral BID   enoxaparin (LOVENOX) injection  40 mg Subcutaneous Q24H   feeding supplement  1 Container Oral TID WC   feeding supplement  237 mL Oral BID BM   melatonin  5 mg Oral QHS   pantoprazole  40 mg Oral Daily   sodium chloride flush  10-40 mL Intracatheter Q12H   Continuous Infusions:     LOS: 64 days    Time spent: 20 minutes     Pennie Banter, DO [facility]  08/06/2021, 2:00 PM      If 7PM-7AM, please contact night-coverage. How to contact the [facility] Attending or Consulting provider 7A - 7P or covering provider during after hours 7P -7A, for this patient?    Check the care team in [facility] and look for a) attending/consulting [facility] provider listed and b) the [facility] team listed Log into [email or web address] and use [place]'s universal password to access. If you do not have the password, please contact the hospital operator. Locate the [facility] provider you are looking for under [facility] and page to a number that you can be directly reached. If you still have difficulty reaching the provider, please page the [facility] (Director on Call) for the Hospitalists listed on amion for assistance.

## 2021-08-06 NOTE — Progress Notes (Signed)
Pt refused morning vitals

## 2021-08-07 MED ORDER — ACETAMINOPHEN 650 MG RE SUPP: 650.0000 mg | Freq: Four times a day (QID) | RECTAL | 0 refills | Status: AC | PRN

## 2021-08-07 MED ORDER — ENSURE ENLIVE PO LIQD: 237.0000 mL | Freq: Two times a day (BID) | ORAL | 12 refills | Status: AC

## 2021-08-07 NOTE — Discharge Summary (Signed)
Physician Discharge Summary  Sabrina Johnston EGB:[id number] DOB: 02/16/1956 DOA: 06/01/2021  PCP: Sabrina Regulus, MD  Admit date: 06/01/2021 Discharge date: 08/07/2021  Admitted From: group home Disposition:  respite home ([facility] in [place], [place])  Recommendations for Outpatient Follow-up:  Follow up with PCP in 1-2 weeks Please obtain BMP/CBC in one week   Home Health: no  Equipment/Devices: none   Discharge Condition: Stable  CODE STATUS: DNR  Diet recommendation: Dysphagia 3, assist with feeding, finger foods  Discharge Diagnoses: Principal Problem:   Severe intellectual disability Active Problems:   Generalized epilepsy (HCC)   Bacteremia due to Gram-negative bacteria   Seizure disorder (HCC)   Abnormal TSH   Pressure ulcer with abrasion, blister, partial thickness skin loss involving epidermis and/or dermis (HCC)   Insomnia   Lethargy    Summary of HPI and Hospital Course:  65 year old female admitted on 06/01/2021 with agitation, aggressive behavior and multiple falls at her group facility.  She had just been hospitalized for 6 days and treated for sepsis due to E. coli infection, treated with IV antibiotics, discharged only a few hours prior to this presentation.  She underwent extensive neurologic evaluation that was essentially unremarkable for anything acute to explain her presentation.   Past medical history of severe intellectual disability, nonverbal at baseline, epilepsy.    Psychiatry was consulted for management of patient's agitation.  She was started on trazodone and Geodon with good effect at controlling the agitation.  However, later in the admission, patient was noted to be persistently lethargic.  She would mostly sleep, became much less interactive with staff, no longer ambulating, sitting up, engaging in play or feeding herself.  Trazodone and Geodon were stopped a few days ago and patient has been more awake and eating well, although staff  are feeding her.    Below list of patient's Likes and Dislikes was compiled by the unit nursing staff, who have come to know Sabrina Johnston well over the past two+ months: Likes: --Anything chocolate --Bananas --Grilled cheese sandwiches --Raffi the singer - songs she loves by him are [employer], Apples and [place], Wheels On the --[address], [employer], [employer] --Sippy cups --[employer] --Playing on the floor --Cartoons - [place] [address], Anything with animals in it --Infant noise makers --Dancing the [employer] for her --Rubbing her head soothes her --Talking to her in baby talk and using soft voices and explaining what you're doing --Soothing sounds  Dislikes: --Loud noises or a lot of commotion --Socks or other things on her feet --Milk --Tea     ================================================  Lethargy - Improved with stopping sedating medications 10/20.  This is likely, in part, due to the hospital environment and prolonged admission.     CT head was unable to be obtained as patient not able to lie flat and still despite sedation.   UA was checked by and out cath sample and negative, culture with insignificant growth.   Chest x-ray was rotated and therefore poor quality but negative for pneumonia and patient does not have respiratory symptoms.   No leukocytosis or fevers. Ammonia level minimally elevated at 41, would not cause encephalopathy    Severe intellectual disability with intermittent agitation - due to recent lethargy over past 1-2 weeks, morning Geodon was stopped.  Later, nighttime trazodone and Geodon were stopped as well 10/20.  Patient was barely waking up or eating/drinking, but this has improved over past few days.  Several medications were stopped earlier this admission  due to polypharmacy and family concern for side effects of so many medications.   No issues with significant agitation in quite some time now. --Monitor and have psychiatry reassess if  agitation or behavioral issues recur  Nutrition - Patient was seen by Dietitian. Boost / Resource Breeze supplement drinks THREE times daily with meals. Ensure Enlive / Ensure Plus drinks TWICE daily in between meals. Dysphagia 3 (chopped). Assist with feeding. Finger foods best when patient able to feed herself. See Likes/Dislikes above.   Insomnia - continue melatonin.   Trazodone and Geodon were stopped due to lethargy, as above.  Seizure disorder -continue Depakote.  Last Depakote level normal, 07/31/2021. Monitor Depakote level routinely.  Diarrhea -secondary to stool softeners/laxatives given due to history of chronic constipation.  These were held and diarrhea resolved.  Resume Miralax if not having regular BM's.   Pressure injury of skin  --Continue local routine wound care  Pressure Injury 08/01/21 Arm Distal;Posterior;Right;Upper Stage 2 -  Partial thickness loss of dermis presenting as a shallow open injury with a red, pink wound bed without slough. red, pink, moist (Active)  08/01/21 0126  Location: Arm  Location Orientation: Distal;Posterior;Right;Upper  Staging: Stage 2 -  Partial thickness loss of dermis presenting as a shallow open injury with a red, pink wound bed without slough.  Wound Description (Comments): red, pink, moist  Present on Admission: No        Discharge Instructions    Allergies as of 08/07/2021   No Known Allergies      Medication List     STOP taking these medications    aluminum-magnesium hydroxide-simethicone 200-200-20 MG/5ML Susp Commonly known as: MAALOX   bacitracin-polymyxin b ointment Commonly known as: POLYSPORIN   Calcium Citrate-Vitamin D 200-250 MG-UNIT Tabs   carbamide peroxide 6.5 % OTIC solution Commonly known as: DEBROX   cephALEXin 500 MG capsule Commonly known as: KEFLEX   CERTAGEN SILVER PO   cetirizine 10 MG tablet Commonly known as: ZYRTEC   Cholecalciferol 50 MCG (2000 UT) Caps   DULoxetine 30  MG capsule Commonly known as: CYMBALTA   DULoxetine 60 MG capsule Commonly known as: CYMBALTA   furosemide 20 MG tablet Commonly known as: LASIX   guaifenesin 100 MG/5ML syrup Commonly known as: ROBITUSSIN   hydrOXYzine 25 MG tablet Commonly known as: ATARAX/VISTARIL   lanolin-mineral oil Lotn   loperamide 2 MG capsule Commonly known as: IMODIUM   magnesium hydroxide 800 MG/5ML suspension Commonly known as: MILK OF MAGNESIA   tolnaftate 1 % powder Commonly known as: TINACTIN       TAKE these medications    acetaminophen 325 MG tablet Commonly known as: TYLENOL Take 2 tablets (650 mg total) by mouth every 6 (six) hours as needed for mild pain or moderate pain (or Fever >/= 101). What changed: reasons to take this   acetaminophen 650 MG suppository Commonly known as: TYLENOL Place 1 suppository (650 mg total) rectally every 6 (six) hours as needed for mild pain (or Fever >/= 101). What changed: You were already taking a medication with the same name, and this prescription was added. Make sure you understand how and when to take each.   bisacodyl 10 MG suppository Commonly known as: DULCOLAX Place 1 suppository (10 mg total) rectally daily as needed for moderate constipation or mild constipation. What changed:  when to take this reasons to take this   divalproex 500 MG DR tablet Commonly known as: DEPAKOTE Take 500 mg by mouth 2 (two) times  daily.   feeding supplement Liqd Take 237 mLs by mouth 2 (two) times daily between meals.   melatonin 5 MG Tabs Take 1 tablet (5 mg total) by mouth at bedtime.   pantoprazole 40 MG tablet Commonly known as: PROTONIX Take 1 tablet (40 mg total) by mouth daily.   polyethylene glycol 17 g packet Commonly known as: MIRALAX / GLYCOLAX Take 17 g by mouth 2 (two) times daily.   senna 8.6 MG tablet Commonly known as: SENOKOT TAKE 2 TABLETS BY MOUTH TWO TIMES A DAY.        No Known Allergies   If you experience  worsening of your admission symptoms, develop shortness of breath, life threatening emergency, suicidal or homicidal thoughts you must seek medical attention immediately by calling 911 or calling your MD immediately  if symptoms less severe.    Please note   You were cared for by a hospitalist during your hospital stay. If you have any questions about your discharge medications or the care you received while you were in the hospital after you are discharged, you can call the unit and asked to speak with the hospitalist on call if the hospitalist that took care of you is not available. Once you are discharged, your primary care physician will handle any further medical issues. Please note that NO REFILLS for any discharge medications will be authorized once you are discharged, as it is imperative that you return to your primary care physician (or establish a relationship with a primary care physician if you do not have one) for your aftercare needs so that they can reassess your need for medications and monitor your lab values.   Consultations: Psychiatry  Palliative Care   Procedures/Studies: DG Chest Port 1 View  Result Date: 07/31/2021 CLINICAL DATA:  Altered mental status EXAM: PORTABLE CHEST 1 VIEW COMPARISON:  06/01/2021 FINDINGS: Only the left hemithorax is completely included. Technologist reports attempted decubitus view, best possible image secondary to patient physical condition. The left lung field is grossly clear. Cardiomediastinal silhouette probably normal. Hazy right lung opacity likely due to technique, positioning and decubitus view. IMPRESSION: Very limited secondary to positioning and technical factors. The left lung is grossly clear. Electronically Signed   By: Jasmine Pang M.D.   On: 07/31/2021 17:09       Subjective: Pt awake this AM. Sitter at bedside reports just waking up.  No acute issues reported overnight.  Patient appears calm and comfortable.  Laying on her right  side as she usually does.   Discharge Exam: Vitals:   08/07/21 0019 08/07/21 0436  BP: 130/87 117/68  Pulse: 77 71  Resp: 15 11  Temp: 98 F (36.7 C) 97.8 F (36.6 C)  SpO2: 97% 100%   Vitals:   08/06/21 2034 08/06/21 2110 08/07/21 0019 08/07/21 0436  BP: 128/71  130/87 117/68  Pulse: 81  77 71  Resp: 12 15 15 11   Temp: (!) 97.5 F (36.4 C)  98 F (36.7 C) 97.8 F (36.6 C)  TempSrc: Oral  Oral Oral  SpO2: 99%  97% 100%  Weight:      Height:        General: Pt is alert, awake, not in acute distress, non-verbal Cardiovascular: RRR, S1/S2 +, no rubs, no gallops Respiratory: CTA bilaterally, no wheezing, no rhonchi Abdominal: Soft, NT, ND, bowel sounds + Extremities: pressure dressings over bony prominences, no edema, no cyanosis    The results of significant diagnostics from this hospitalization (including imaging, microbiology,  ancillary and laboratory) are listed below for reference.     Microbiology: Recent Results (from the past 240 hour(s))  Urine Culture     Status: Abnormal   Collection Time: 07/31/21  6:00 PM   Specimen: Urine, Catheterized  Result Value Ref Range Status   Specimen Description   Final    URINE, CATHETERIZED Performed at [facility], [address], [place], [place] [address]    Special Requests NONE  Final   Culture (A)  Final    <10,000 COLONIES/mL INSIGNIFICANT GROWTH Performed at [facility], [address], [place], [place] [address]    Report Status 08/01/2021 FINAL  Final  Resp Panel by RT-PCR (Flu A&B, Covid) Nasopharyngeal Swab     Status: None   Collection Time: 08/02/21 10:55 AM   Specimen: Nasopharyngeal Swab; Nasopharyngeal(NP) swabs in vial transport medium  Result Value Ref Range Status   SARS Coronavirus 2 by RT PCR NEGATIVE NEGATIVE Final    Comment: (NOTE) SARS-CoV-2 target nucleic acids are NOT DETECTED.  The SARS-CoV-2 RNA is generally detectable in upper respiratory specimens during the acute  phase of infection. The lowest concentration of SARS-CoV-2 viral copies this assay can detect is 138 copies/mL. A negative result does not preclude SARS-Cov-2 infection and should not be used as the sole basis for treatment or other patient management decisions. A negative result may occur with  improper specimen collection/handling, submission of specimen other than nasopharyngeal swab, presence of viral mutation(s) within the areas targeted by this assay, and inadequate number of viral copies(<138 copies/mL). A negative result must be combined with clinical observations, patient history, and epidemiological information. The expected result is Negative.  Fact Sheet for Patients:  BloggerCourse.com  Fact Sheet for Healthcare Providers:  SeriousBroker.it  This test is no t yet approved or cleared by the [place] FDA and  has been authorized for detection and/or diagnosis of SARS-CoV-2 by FDA under an Emergency Use Authorization (EUA). This EUA will remain  in effect (meaning this test can be used) for the duration of the COVID-19 declaration under Section 564(b)(1) of the Act, 21 U.S.C.section 360bbb-3(b)(1), unless the authorization is terminated  or revoked sooner.       Influenza A by PCR NEGATIVE NEGATIVE Final   Influenza B by PCR NEGATIVE NEGATIVE Final    Comment: (NOTE) The Xpert Xpress SARS-CoV-2/FLU/RSV plus assay is intended as an aid in the diagnosis of influenza from Nasopharyngeal swab specimens and should not be used as a sole basis for treatment. Nasal washings and aspirates are unacceptable for Xpert Xpress SARS-CoV-2/FLU/RSV testing.  Fact Sheet for Patients: BloggerCourse.com  Fact Sheet for Healthcare Providers: SeriousBroker.it  This test is not yet approved or cleared by the [place] FDA and has been authorized for detection and/or diagnosis of  SARS-CoV-2 by FDA under an Emergency Use Authorization (EUA). This EUA will remain in effect (meaning this test can be used) for the duration of the COVID-19 declaration under Section 564(b)(1) of the Act, 21 U.S.C. section 360bbb-3(b)(1), unless the authorization is terminated or revoked.  Performed at [facility], [address] [place], [place] [address]      Labs: BNP (last 3 results) No results for input(s): BNP in the last 8760 hours. Basic Metabolic Panel: Recent Labs  Lab 08/05/21 0349  NA 142  K 4.1  CL 109  CO2 27  GLUCOSE 86  BUN 24*  CREATININE 0.70  CALCIUM 9.3   Liver Function Tests: No results for  input(s): AST, ALT, ALKPHOS, BILITOT, PROT, ALBUMIN in the last 168 hours. No results for input(s): LIPASE, AMYLASE in the last 168 hours. Recent Labs  Lab 08/03/21 1150  AMMONIA 41*   CBC: Recent Labs  Lab 08/05/21 0349  WBC 5.9  HGB 15.2*  HCT 45.4  MCV 106.8*  PLT 223   Cardiac Enzymes: No results for input(s): CKTOTAL, CKMB, CKMBINDEX, TROPONINI in the last 168 hours. BNP: Invalid input(s): POCBNP CBG: No results for input(s): GLUCAP in the last 168 hours. D-Dimer No results for input(s): DDIMER in the last 72 hours. Hgb A1c No results for input(s): HGBA1C in the last 72 hours. Lipid Profile No results for input(s): CHOL, HDL, LDLCALC, TRIG, CHOLHDL, LDLDIRECT in the last 72 hours. Thyroid function studies No results for input(s): TSH, T4TOTAL, T3FREE, THYROIDAB in the last 72 hours.  Invalid input(s): FREET3 Anemia work up No results for input(s): VITAMINB12, FOLATE, FERRITIN, TIBC, IRON, RETICCTPCT in the last 72 hours. Urinalysis    Component Value Date/Time   COLORURINE YELLOW (A) 07/31/2021 1800   APPEARANCEUR CLEAR (A) 07/31/2021 1800   APPEARANCEUR Cloudy (A) 10/26/2016 1137   LABSPEC 1.021 07/31/2021 1800   PHURINE 5.0 07/31/2021 1800   GLUCOSEU NEGATIVE 07/31/2021 1800   HGBUR NEGATIVE 07/31/2021 1800    BILIRUBINUR NEGATIVE 07/31/2021 1800   BILIRUBINUR 1+ 11/01/2017 1630   BILIRUBINUR Negative 10/26/2016 1137   KETONESUR 20 (A) 07/31/2021 1800   PROTEINUR NEGATIVE 07/31/2021 1800   UROBILINOGEN 0.2 11/01/2017 1630   NITRITE NEGATIVE 07/31/2021 1800   LEUKOCYTESUR NEGATIVE 07/31/2021 1800   Sepsis Labs Invalid input(s): PROCALCITONIN,  WBC,  LACTICIDVEN Microbiology Recent Results (from the past 240 hour(s))  Urine Culture     Status: Abnormal   Collection Time: 07/31/21  6:00 PM   Specimen: Urine, Catheterized  Result Value Ref Range Status   Specimen Description   Final    URINE, CATHETERIZED Performed at [facility], [address] [place], [place] [address]    Special Requests NONE  Final   Culture (A)  Final    <10,000 COLONIES/mL INSIGNIFICANT GROWTH Performed at [facility], [address], [place], [place] [address]    Report Status 08/01/2021 FINAL  Final  Resp Panel by RT-PCR (Flu A&B, Covid) Nasopharyngeal Swab     Status: None   Collection Time: 08/02/21 10:55 AM   Specimen: Nasopharyngeal Swab; Nasopharyngeal(NP) swabs in vial transport medium  Result Value Ref Range Status   SARS Coronavirus 2 by RT PCR NEGATIVE NEGATIVE Final    Comment: (NOTE) SARS-CoV-2 target nucleic acids are NOT DETECTED.  The SARS-CoV-2 RNA is generally detectable in upper respiratory specimens during the acute phase of infection. The lowest concentration of SARS-CoV-2 viral copies this assay can detect is 138 copies/mL. A negative result does not preclude SARS-Cov-2 infection and should not be used as the sole basis for treatment or other patient management decisions. A negative result may occur with  improper specimen collection/handling, submission of specimen other than nasopharyngeal swab, presence of viral mutation(s) within the areas targeted by this assay, and inadequate number of viral copies(<138 copies/mL). A negative result must be combined  with clinical observations, patient history, and epidemiological information. The expected result is Negative.  Fact Sheet for Patients:  BloggerCourse.com  Fact Sheet for Healthcare Providers:  SeriousBroker.it  This test is no t yet approved or cleared by the [place] FDA and  has been authorized for detection and/or diagnosis of SARS-CoV-2 by FDA under an  Emergency Use Authorization (EUA). This EUA will remain  in effect (meaning this test can be used) for the duration of the COVID-19 declaration under Section 564(b)(1) of the Act, 21 U.S.C.section 360bbb-3(b)(1), unless the authorization is terminated  or revoked sooner.       Influenza A by PCR NEGATIVE NEGATIVE Final   Influenza B by PCR NEGATIVE NEGATIVE Final    Comment: (NOTE) The Xpert Xpress SARS-CoV-2/FLU/RSV plus assay is intended as an aid in the diagnosis of influenza from Nasopharyngeal swab specimens and should not be used as a sole basis for treatment. Nasal washings and aspirates are unacceptable for Xpert Xpress SARS-CoV-2/FLU/RSV testing.  Fact Sheet for Patients: BloggerCourse.com  Fact Sheet for Healthcare Providers: SeriousBroker.it  This test is not yet approved or cleared by the [place] FDA and has been authorized for detection and/or diagnosis of SARS-CoV-2 by FDA under an Emergency Use Authorization (EUA). This EUA will remain in effect (meaning this test can be used) for the duration of the COVID-19 declaration under Section 564(b)(1) of the Act, 21 U.S.C. section 360bbb-3(b)(1), unless the authorization is terminated or revoked.  Performed at [facility], [address] [place], [place] [address]      Time coordinating discharge: Over 30 minutes  SIGNED:   Pennie Banter, DO [facility] 08/07/2021, 8:16 AM   If 7PM-7AM, please contact  night-coverage [email or web address]

## 2021-08-07 NOTE — TOC Transition Note (Signed)
Transition of Care [facility]) - CM/SW Discharge Note   Patient Details  Name: Sabrina Johnston MRN: [id number] Date of Birth: 1956/07/31  Transition [facility] CM/SW Contact:  Allayne Butcher, RN Phone Number: 08/07/2021, 9:13 AM   Clinical Narrative:     Patient will discharge to [facility] today in [place], [address]  Ambulance person scheduled to pick patient up at 10 am.  DC summary, FL2, and progress note from the RN put in DC packet to go with patient to facility.    Final next level of care: OP Rehab (Specialized Care Home Respite Care) Barriers to Discharge: Barriers Resolved   Patient Goals and CMS Choice Patient states their goals for this hospitalization and ongoing recovery are:: Patient needs placement in specialized care home or facility CMS Medicare.gov Compare Post Acute Care list provided to:: Legal Guardian Choice offered to / list presented to : [facility] POA / Guardian  Discharge Placement              Patient chooses bed at: Other - please specify in the comment section below: ([facility] in [place]) Patient to be transferred to facility by: Hyacinth Meeker Transport Name of family member notified: [name] Patient and family notified of of transfer: 08/07/21  Discharge Plan and Services   Discharge Planning Services: CM Consult            DME Arranged: N/A DME Agency: NA       HH Arranged: NA          Social Determinants of Health (SDOH) Interventions     Readmission Risk Interventions Readmission Risk Prevention Plan 06/06/2021  Post Dischage Appt Complete  Medication Screening Complete  Transportation Screening Complete  Some recent data might be hidden

## 2021-08-07 NOTE — NC FL2 (Signed)
[place] MEDICAID FL2 LEVEL OF CARE SCREENING TOOL     IDENTIFICATION  Patient Name: Sabrina Johnston Birthdate: 06/05/1956 Sex: female Admission Date (Current Location): 06/01/2021  [facility] and [place] Number:  Chiropodist and Address:  [facility], [address], [place], [place] [address]      Provider Number: [id number]  Attending Physician Name and Address:  Pennie Banter, DO  Relative Name and Phone Number:  Coble,Lisa (Legal Guardian) (Sister)   [phone] [facility])    Current Level of Care: Hospital Recommended Level of Care: Other (Comment) (Specialized care home) Prior Approval Number:    Date Approved/Denied:   PASRR Number: pending  Discharge Plan: Other (Comment) (Specialized care home)    Current Diagnoses: Patient Active Problem List   Diagnosis Date Noted   Lethargy    Insomnia    Pressure ulcer with abrasion, blister, partial thickness skin loss involving epidermis and/or dermis (HCC) 06/05/2021   Abnormal TSH 06/03/2021   Bacteremia due to Gram-negative bacteria    Seizure disorder (HCC)    Dehydration 05/27/2021   Altered mental status    Hypotension    Loose stools 10/25/2016   Menopause 10/25/2016   Chronic constipation 12/10/2014   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   Severe intellectual disability 12/10/2014    Orientation RESPIRATION BLADDER Height & Weight     Self  Normal Incontinent Weight: 56.9 kg (bed weight) Height:  5' (152.4 cm)  BEHAVIORAL SYMPTOMS/MOOD NEUROLOGICAL BOWEL NUTRITION STATUS    Convulsions/Seizures Incontinent  (Dysphagia 3, thin liquids, finger foods)  AMBULATORY STATUS COMMUNICATION OF NEEDS Skin   Limited Assist Non-Verbally Normal                       Personal Care Assistance Level of Assistance  Bathing, Feeding, Dressing Bathing Assistance: Maximum assistance Feeding assistance: Limited assistance Dressing Assistance: Maximum assistance      Functional Limitations Info  Sight, Hearing, Speech Sight Info: Impaired Hearing Info:  ([place]ute) Speech Info: Impaired ([place])    SPECIAL CARE FACTORS FREQUENCY                       Contractures Contractures Info: Not present    Additional Factors Info  Allergies, Code Status Code Status Info: DNR Allergies Info: No Known Allergies           Current Medications (08/07/2021):  This is the current hospital active medication list Current Facility-Administered Medications  Medication Dose Route Frequency Provider Last Rate Last Admin   acetaminophen (TYLENOL) tablet 650 mg  650 mg Oral Q6H PRN Agbata, Tochukwu, MD   650 mg at 07/31/21 1619   Or   acetaminophen (TYLENOL) suppository 650 mg  650 mg Rectal Q6H PRN Agbata, Tochukwu, MD   650 mg at 06/02/21 1527   bisacodyl (DULCOLAX) suppository 10 mg  10 mg Rectal Daily PRN Pokhrel, Laxman, MD   10 mg at 08/05/21 0950   divalproex (DEPAKOTE SPRINKLE) capsule 500 mg  500 mg Oral BID Gillis Santa, MD   500 mg at 08/07/21 0836   enoxaparin (LOVENOX) injection 40 mg  40 mg Subcutaneous Q24H Charise Killian, MD   40 mg at 08/06/21 2109   feeding supplement (BOOST / RESOURCE BREEZE) liquid 1 Container  1 Container Oral TID WC Esaw Grandchild A, DO   1 Container at 08/07/21 0842   feeding supplement (ENSURE ENLIVE / ENSURE PLUS) liquid 237 mL  237 mL Oral BID  BM Esaw Grandchild A, DO   237 mL at 08/07/21 0838   hydrocortisone cream 1 % 1 application  1 application Topical TID PRN Joycelyn Das, MD   1 application at 07/09/21 2048   ibuprofen (ADVIL) 100 MG/5ML suspension 600 mg  600 mg Oral Q6H PRN Pokhrel, Laxman, MD   600 mg at 07/19/21 5852   melatonin tablet 5 mg  5 mg Oral QHS Gillis Santa, MD   5 mg at 08/06/21 2109   ondansetron (ZOFRAN) tablet 4 mg  4 mg Oral Q6H PRN Agbata, Tochukwu, MD       Or   ondansetron (ZOFRAN) injection 4 mg  4 mg Intravenous Q6H PRN Agbata, Tochukwu, MD       pantoprazole (PROTONIX) EC  tablet 40 mg  40 mg Oral Daily Pokhrel, Laxman, MD   40 mg at 08/07/21 0836   sodium chloride flush (NS) 0.9 % injection 10-40 mL  10-40 mL Intracatheter Q12H Calvert Cantor, MD   10 mL at 08/06/21 2109   sodium chloride flush (NS) 0.9 % injection 10-40 mL  10-40 mL Intracatheter PRN Calvert Cantor, MD         Discharge Medications: Medication List       STOP taking these medications     aluminum-magnesium hydroxide-simethicone 200-200-20 MG/5ML Susp Commonly known as: MAALOX    bacitracin-polymyxin b ointment Commonly known as: POLYSPORIN    Calcium Citrate-Vitamin D 200-250 MG-UNIT Tabs    carbamide peroxide 6.5 % OTIC solution Commonly known as: DEBROX    cephALEXin 500 MG capsule Commonly known as: KEFLEX    CERTAGEN SILVER PO    cetirizine 10 MG tablet Commonly known as: ZYRTEC    Cholecalciferol 50 MCG (2000 UT) Caps    DULoxetine 30 MG capsule Commonly known as: CYMBALTA    DULoxetine 60 MG capsule Commonly known as: CYMBALTA    furosemide 20 MG tablet Commonly known as: LASIX    guaifenesin 100 MG/5ML syrup Commonly known as: ROBITUSSIN    hydrOXYzine 25 MG tablet Commonly known as: ATARAX/VISTARIL    lanolin-mineral oil Lotn    loperamide 2 MG capsule Commonly known as: IMODIUM    magnesium hydroxide 800 MG/5ML suspension Commonly known as: MILK OF MAGNESIA    tolnaftate 1 % powder Commonly known as: TINACTIN           TAKE these medications     acetaminophen 325 MG tablet Commonly known as: TYLENOL Take 2 tablets (650 mg total) by mouth every 6 (six) hours as needed for mild pain or moderate pain (or Fever >/= 101). What changed: reasons to take this    acetaminophen 650 MG suppository Commonly known as: TYLENOL Place 1 suppository (650 mg total) rectally every 6 (six) hours as needed for mild pain (or Fever >/= 101). What changed: You were already taking a medication with the same name, and this prescription was added. Make sure you  understand how and when to take each.    bisacodyl 10 MG suppository Commonly known as: DULCOLAX Place 1 suppository (10 mg total) rectally daily as needed for moderate constipation or mild constipation. What changed:  when to take this reasons to take this    divalproex 500 MG DR tablet Commonly known as: DEPAKOTE Take 500 mg by mouth 2 (two) times daily.    feeding supplement Liqd Take 237 mLs by mouth 2 (two) times daily between meals.    melatonin 5 MG Tabs Take 1 tablet (5 mg total) by mouth  at bedtime.    pantoprazole 40 MG tablet Commonly known as: PROTONIX Take 1 tablet (40 mg total) by mouth daily.    polyethylene glycol 17 g packet Commonly known as: MIRALAX / GLYCOLAX Take 17 g by mouth 2 (two) times daily.    senna 8.6 MG tablet Commonly known as: SENOKOT TAKE 2 TABLETS BY MOUTH TWO TIMES A DAY.      Relevant Imaging Results:  Relevant Lab Results:   Additional Information SSN [id number]  Allayne Butcher, RN

## 2021-08-07 NOTE — Progress Notes (Signed)
Pt leaving for facility today. Would like to share a list of likes and dislikes that this pt has.   Likes:  Anything chocolate Bananas Grilled cheese sandwiches Raffi the singer - songs she loves by him are [employer], Apples and [place], Wheels On the [address], [place], Itsy Bitsy Spider Sippy cups [employer] on the floor Cartoons - [address], Anything with animals in it Infant noise makers Dancing the [employer] for her Rubbing her head soothes her Talking to her in baby talk and using soft voices and explaining what you're doing Soothing sounds    Dislikes: Loud noises or a lot of commotion Socks or other things on her feet Milk Tea

## 2021-08-08 NOTE — TOC Transition Note (Signed)
Transition of Care [facility]) - CM/SW Discharge Note   Patient Details  Name: Sabrina Johnston MRN: [id number] Date of Birth: [birth year]  Transition of Care [facility]) CM/SW Contact:  Okoboji Cellar, RN Phone Number: 08/08/2021, 9:37 AM   Clinical Narrative:    Received call from [facility] @ receiving facility [phone] @ 5:35pm  expressing concerns due to Depakote prescription missing from paperwork. Confirmed Depakote was on dc summary and prearranged paperwork but [facility] unable to fill without confirmation. This Clinical research associate, RN CM called [facility] [phone] and provided verbal order for Depakote 500mg  BID. #60. 30 day supply. Contacted Melissa back to confirm prescription had been completed. Melissa reported patient had multiple "wounds" on her body and no orders for care. RN CM confirmed no wound care was noted in paperwork and nurse reported patient had foam pads on bony prominences but only one pressure injury.     Final next level of care: OP Rehab (Specialized Care Home Respite Care) Barriers to Discharge: Barriers Resolved   Patient Goals and CMS Choice Patient states their goals for this hospitalization and ongoing recovery are:: Patient needs placement in specialized care home or facility CMS Medicare.gov Compare Post Acute Care list provided to:: Legal Guardian Choice offered to / list presented to : [facility] POA / Guardian  Discharge Placement              Patient chooses bed at: Other - please specify in the comment section below: ([facility] in [place]) Patient to be transferred to facility by: [facility] Name of family member notified: Hyacinth Meeker Patient and family notified of of transfer: 08/07/21  Discharge Plan and Services   Discharge Planning Services: CM Consult            DME Arranged: N/A DME Agency: NA       HH Arranged: NA          Social Determinants of Health (SDOH) Interventions     Readmission Risk Interventions Readmission Risk Prevention  Plan 06/06/2021  Post Dischage Appt Complete  Medication Screening Complete  Transportation Screening Complete  Some recent data might be hidden

## 2023-05-04 IMAGING — DX DG CHEST 1V PORT
1 series · 1 of 1 positions shown · non-contrast
Comparison: Report 01/21/2015

CLINICAL DATA: Fall weakness

EXAM:
PORTABLE CHEST 1 VIEW

[chest ap]
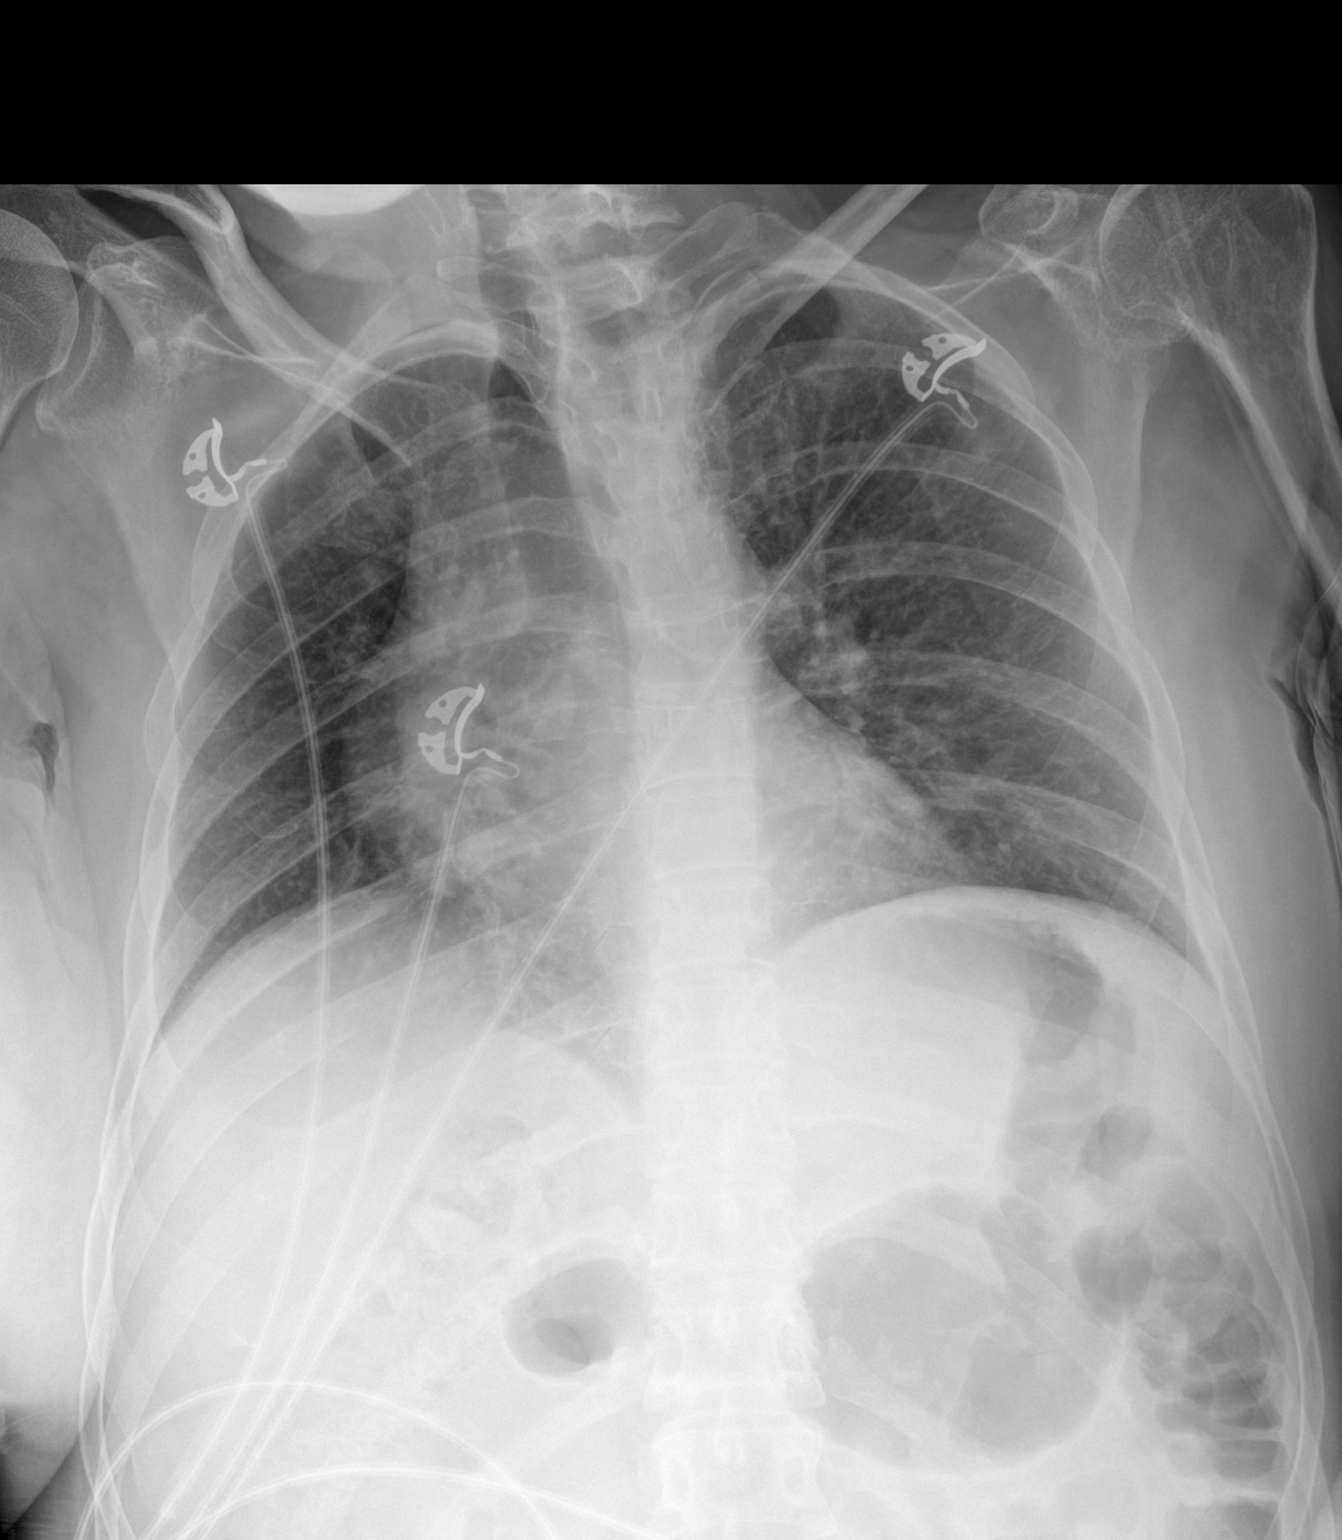

[1 of 1 positions shown; findings below may reference images not displayed]

FINDINGS: Single-view chest demonstrates no focal opacity or pleural effusion.
Normal cardiomediastinal silhouette. No pneumothorax.
IMPRESSION: No active disease.

## 2023-05-04 IMAGING — CT CT HEAD W/O CM
3 series · 14 of 46 positions shown, 16 images · non-contrast
Comparison: None.

CLINICAL DATA: Mental status change right-sided weakness.

EXAM:
CT HEAD WITHOUT CONTRAST
CT MAXILLOFACIAL WITHOUT CONTRAST
CT CERVICAL SPINE WITHOUT CONTRAST
TECHNIQUE: Multidetector CT imaging of the head, cervical spine, and
maxillofacial structures were performed using the standard protocol
without intravenous contrast. Multiplanar CT image reconstructions
of the cervical spine and maxillofacial structures were also
generated.

[Series 2: head wo · axial · 0.39mm/px · z∈[-150,-30]mm · 8 of 29 slices shown, 10 images]
[im 3/29  brain]
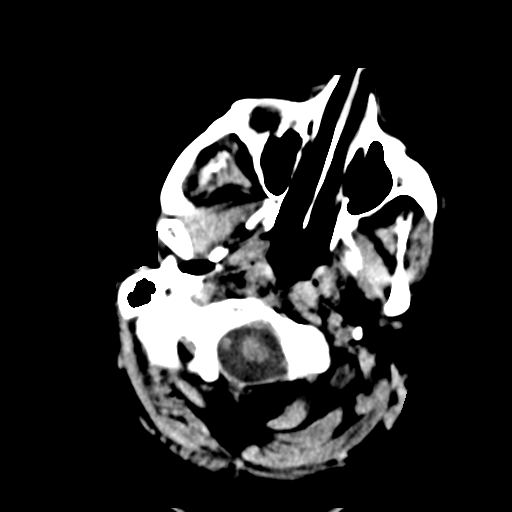
[im 3/29  bone]
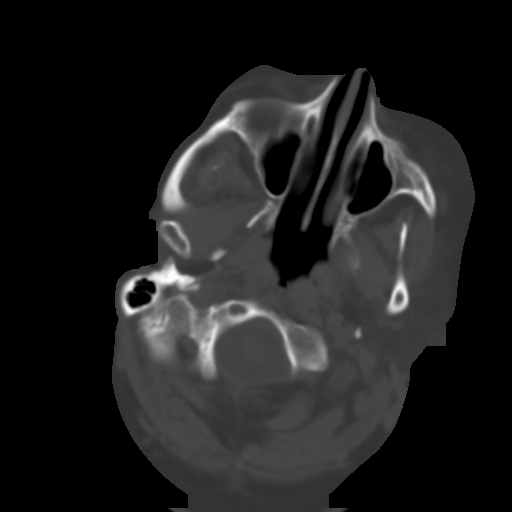
[im 7/29  brain]
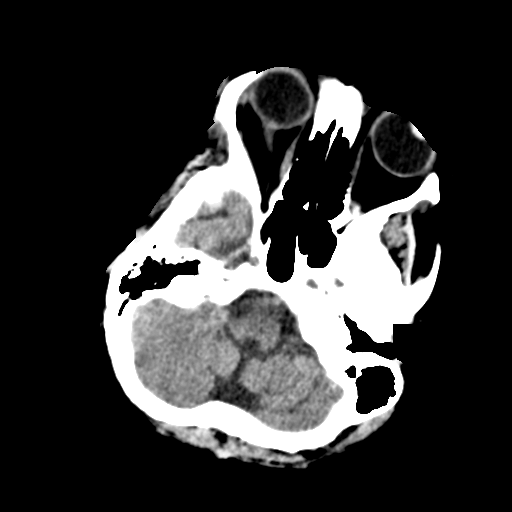
[im 10/29  brain]
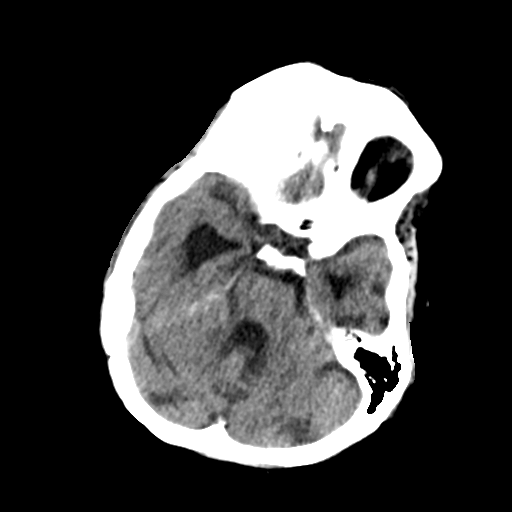
[im 13/29  brain]
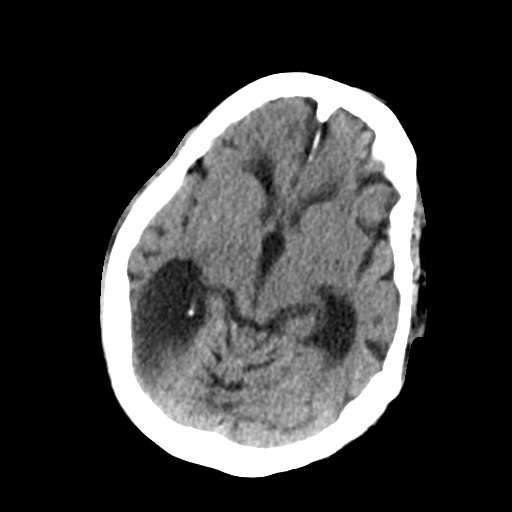
[im 17/29  brain]
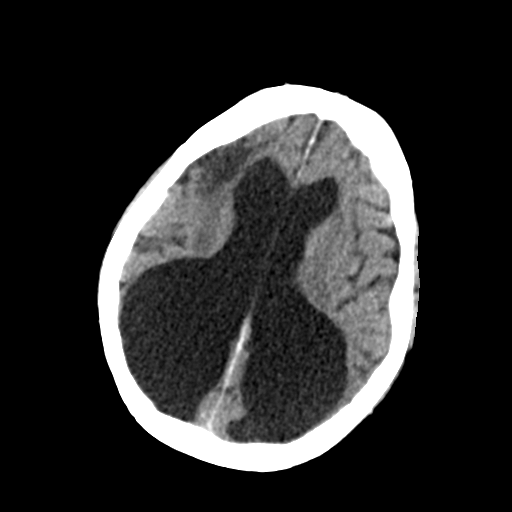
[im 17/29  bone]
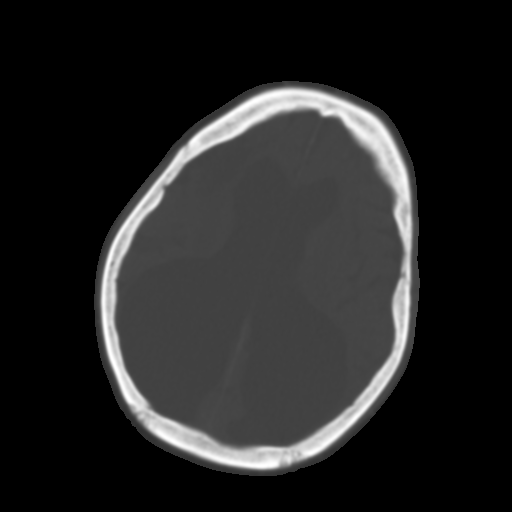
[im 20/29  brain]
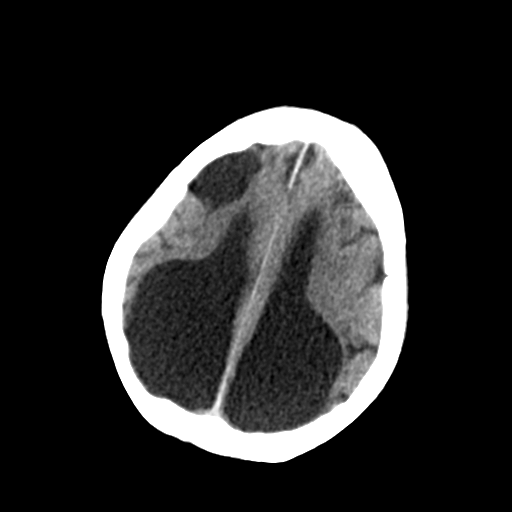
[im 23/29  brain]
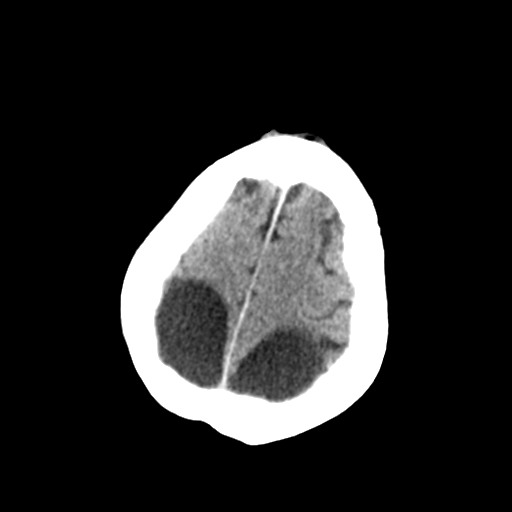
[im 27/29  brain]
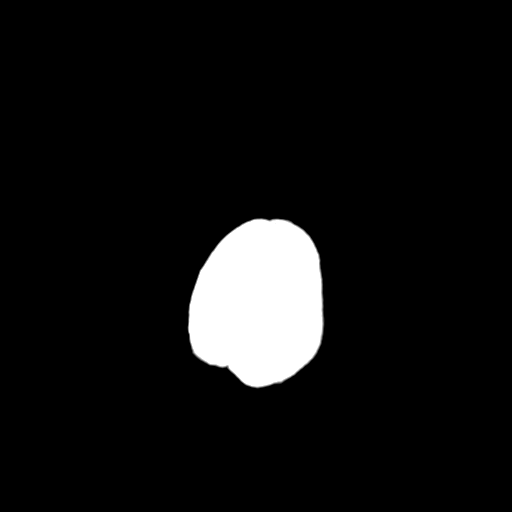

[Series 4: coronal soft tissue · coronal · 0.32mm/px · 3 of 68 slices shown]
[im 23/68  brain]
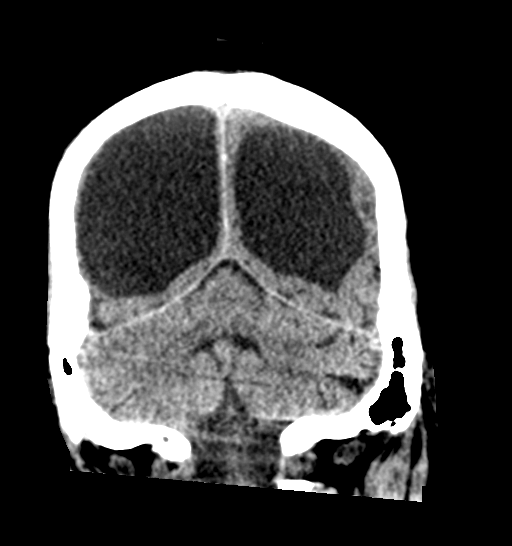
[im 30/68  brain]
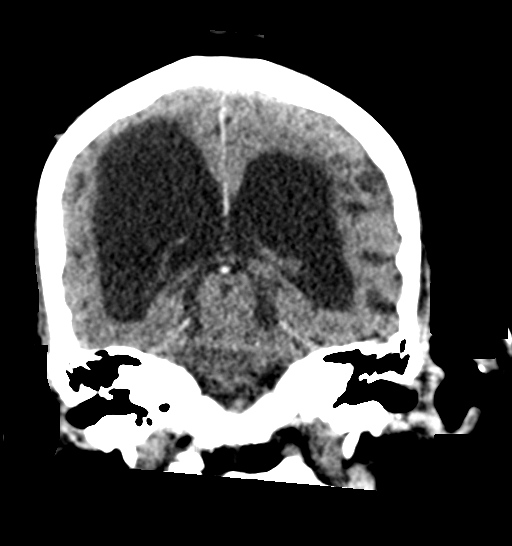
[im 38/68  brain]
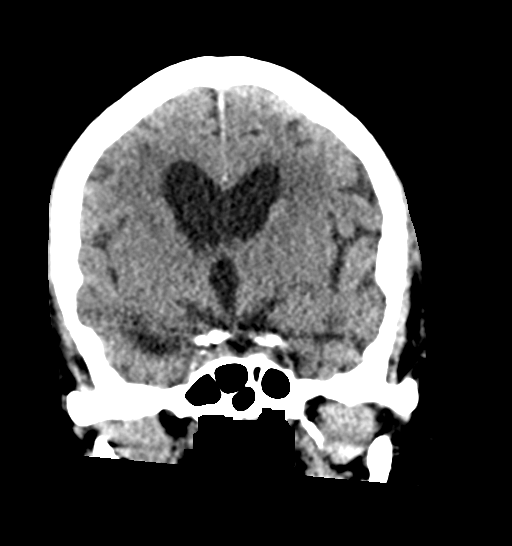

[Series 5: sagittal soft tissue · sagittal · 0.32mm/px · 3 of 55 slices shown]
[im 19/55  brain]
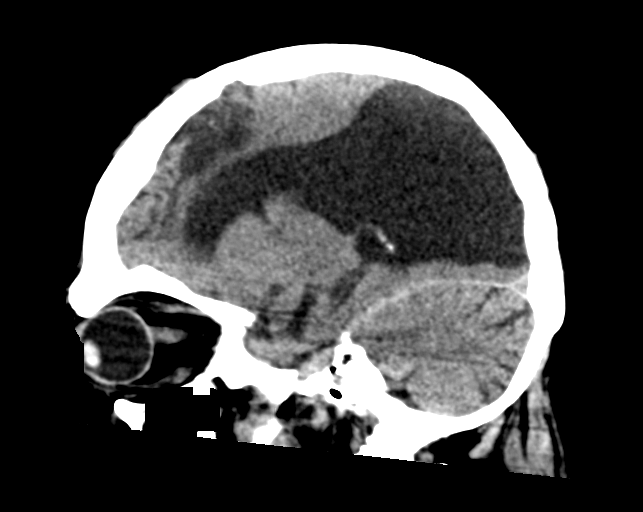
[im 28/55  brain]
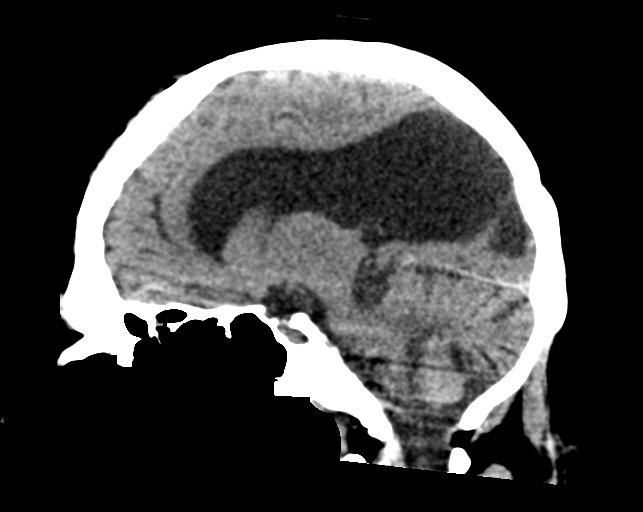
[im 37/55  brain]
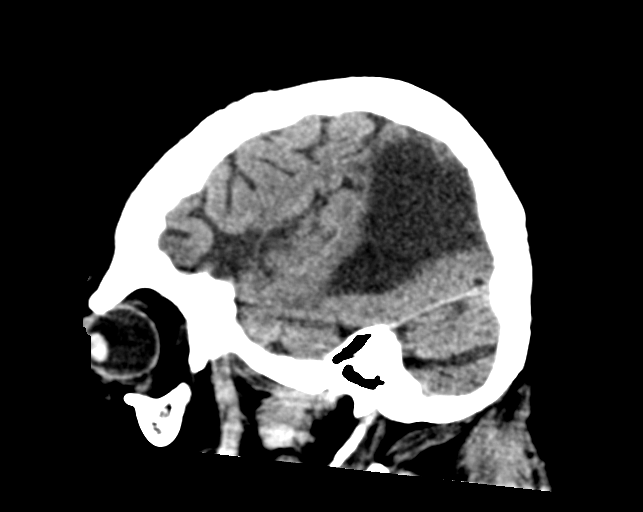

[14 of 46 positions shown; findings below may reference images not displayed]

FINDINGS: CT HEAD FINDINGS

Brain: Bilateral parieto-occipital encephalomalacia noted with ex
vacuo dilation the ventricles. Remote anterior right frontal lobe
infarct present. Remote lacunar infarct is present right caudate
head. Basal ganglia are otherwise intact. Mild cerebellar atrophy
noted. Hemorrhage or mass lesion is present. No significant
extraaxial fluid collection is present.

Vascular: No hyperdense vessel or unexpected calcification.

Skull: Calvarium is intact. No focal lytic or blastic lesions are
present. No significant extracranial soft tissue lesion is present.

CT MAXILLOFACIAL FINDINGS

Osseous: No acute or healing fractures are present. Mandible is
intact and located. Patient is edentulous.

Orbits: The globes and orbits are within normal limits.

Sinuses: The paranasal sinuses and mastoid air cells are clear.

Soft tissues: The soft tissues of face are unremarkable.

CT CERVICAL SPINE FINDINGS

Alignment: No significant listhesis is present. Straightening of the
normal cervical lordosis is present.

Skull base and vertebrae: Craniocervical junction is within normal
limits. Vertebral body heights are maintained. No acute or healing
fractures are present.

Soft tissues and spinal canal: No prevertebral fluid or swelling. No
visible canal hematoma.

Disc levels: Uncovertebral and facet disease is present in the
cervical spine. Foraminal narrowing is greatest at C3-4, right
greater than left. Facet hypertrophy results in moderate left
foraminal stenosis at C2-3.

Upper chest: The lung apices are clear. Thoracic inlet is within
normal limits.
IMPRESSION: 1. Bilateral chronic parieto-occipital encephalomalacia with ex
vacuo dilation the ventricles.
2. Remote anterior right frontal lobe infarct.
3. Remote lacunar infarct of the right caudate head.
4. No acute intracranial abnormality.
5. No acute or healing fractures of the face.
6. Multilevel degenerative changes of the cervical spine without
acute fracture or traumatic subluxation.

## 2023-05-04 IMAGING — MR MR HEAD W/O CM
8 of 12 series · 31 of 48 positions shown · non-contrast
Comparison: CT from 05/26/2021.

CLINICAL DATA: Initial evaluation for right-sided weakness,
decreased PO intake.

EXAM:
MRI HEAD WITHOUT CONTRAST
TECHNIQUE: Multiplanar, multiecho pulse sequences of the brain and surrounding
structures were obtained without intravenous contrast.

[Series 2: ax dwi_tracew · axial · 3.0mm · 0.71mm/px · z∈[-85,+42]mm · 7 of 44 slices shown]
[im 1/44]
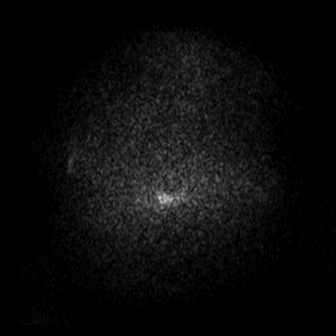
[im 8/44]
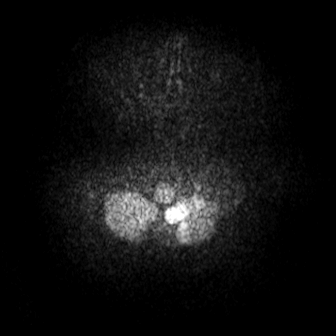
[im 15/44]
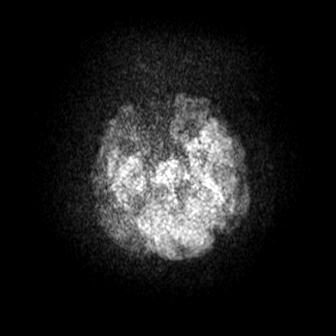
[im 22/44]
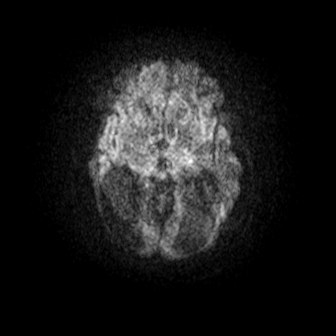
[im 29/44]
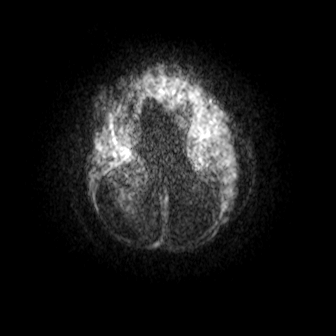
[im 36/44]
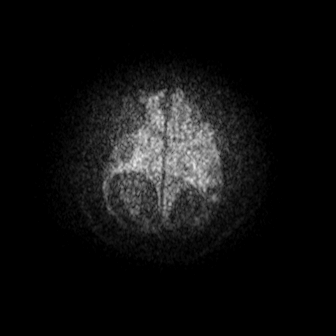
[im 44/44]
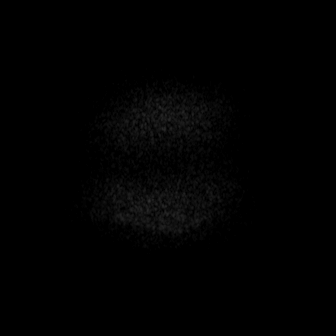

[Series 3: ax dwi_adc · axial · 3.0mm · 0.71mm/px · z∈[-85,-2]mm · 5 of 44 slices shown]
[im 1/44]
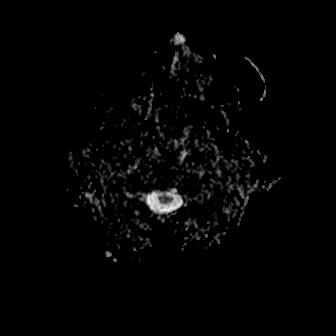
[im 8/44]
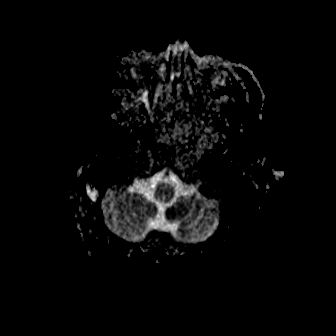
[im 15/44]
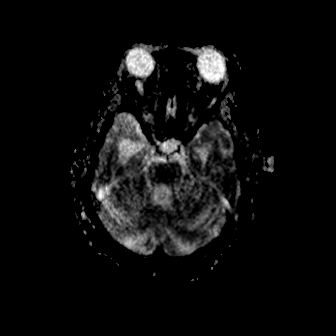
[im 22/44]
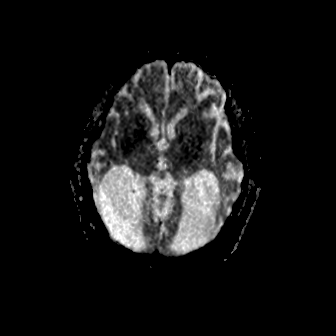
[im 29/44]
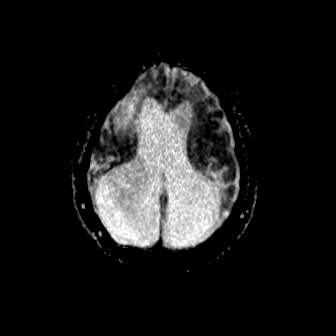

[Series 6: T1 · sagittal · 5.0mm · 0.47mm/px · 3 of 18 slices shown (1 of 3)]
[im 1/18]
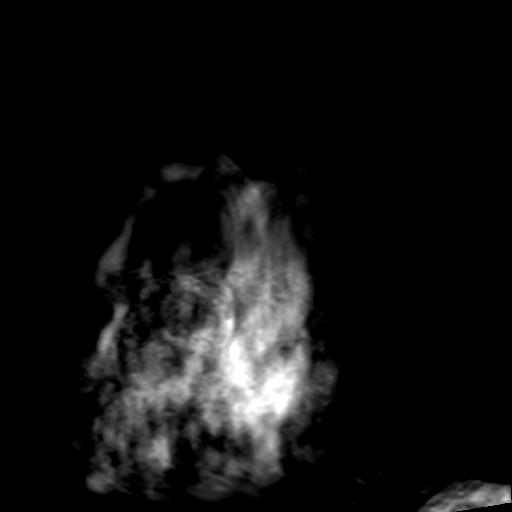
[im 9/18]
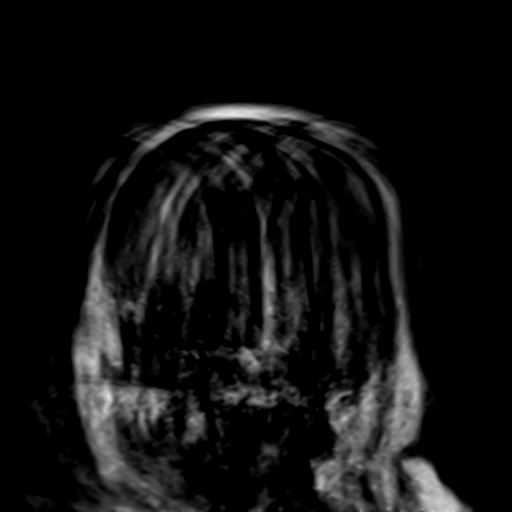
[im 18/18]
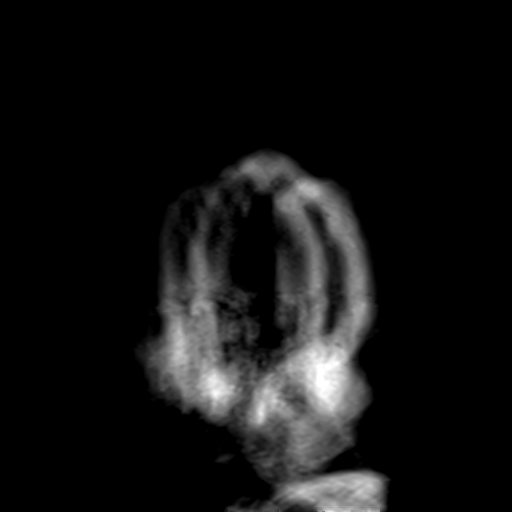

[Series 7: T2 · axial · 5.0mm · 0.45mm/px · z∈[-88,+41]mm · 3 of 23 slices shown (1 of 2)]
[im 1/23]
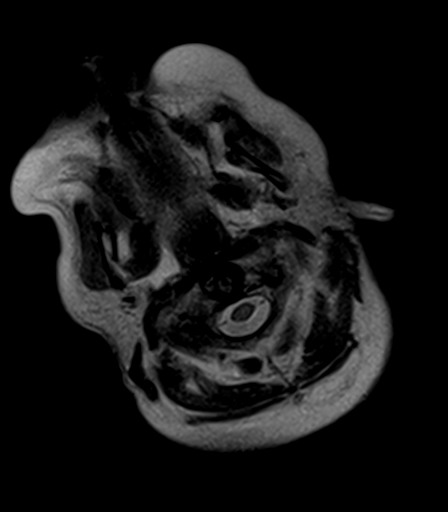
[im 12/23]
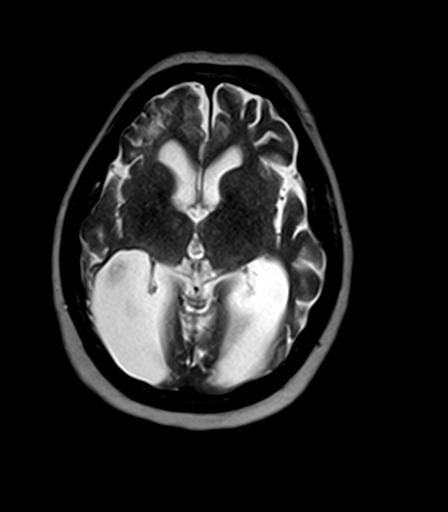
[im 23/23]
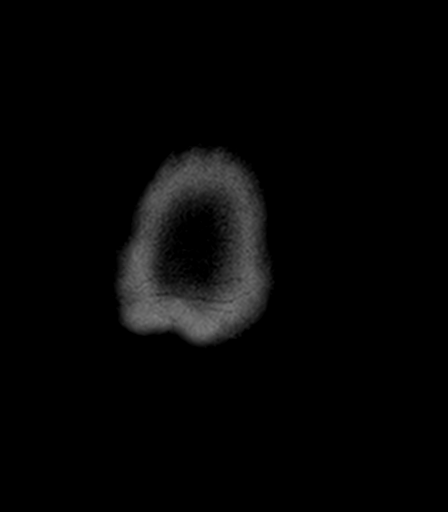

[Series 9: FLAIR · axial · 5.0mm · 1.20mm/px · z∈[-87,+42]mm · 3 of 23 slices shown]
[im 1/23]
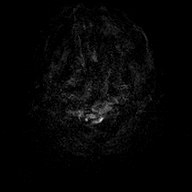
[im 12/23]
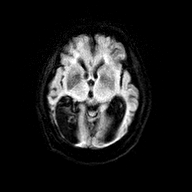
[im 23/23]
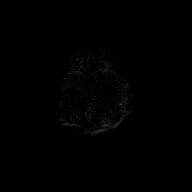

[Series 11: T1 · sagittal · 5.0mm · 0.47mm/px · 3 of 18 slices shown (2 of 3)]
[im 1/18]
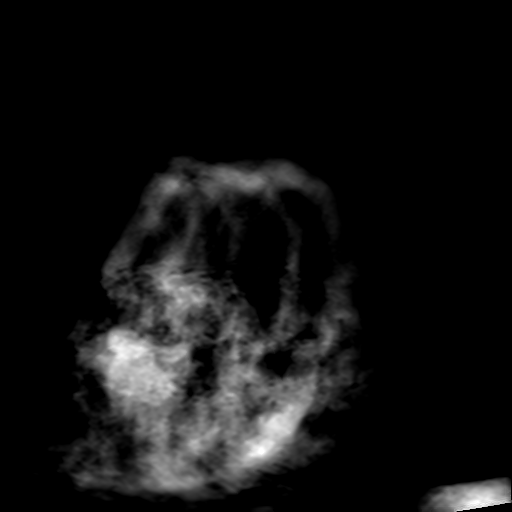
[im 9/18]
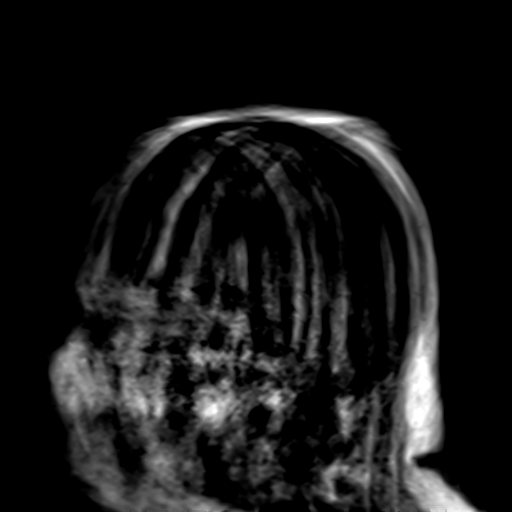
[im 18/18]
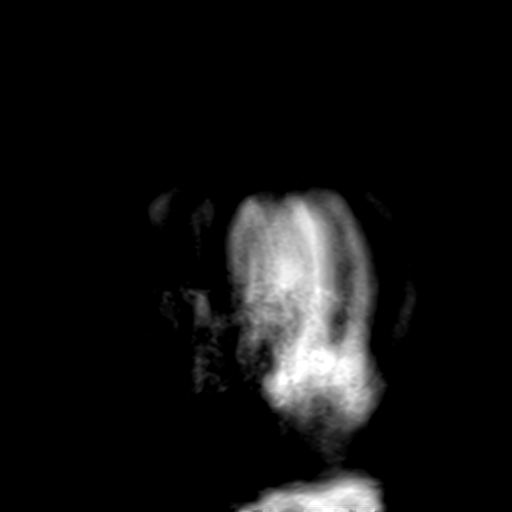

[Series 12: T1 · axial · 5.0mm · 0.90mm/px · z∈[-88,+41]mm · 3 of 23 slices shown (3 of 3)]
[im 1/23]
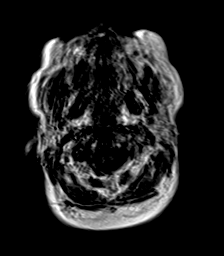
[im 12/23]
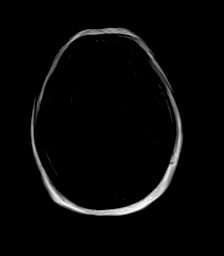
[im 23/23]
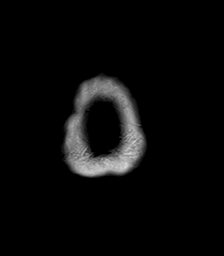

[Series 13: T2 · coronal · 5.0mm · 0.45mm/px · 4 of 26 slices shown (2 of 2)]
[im 1/26]
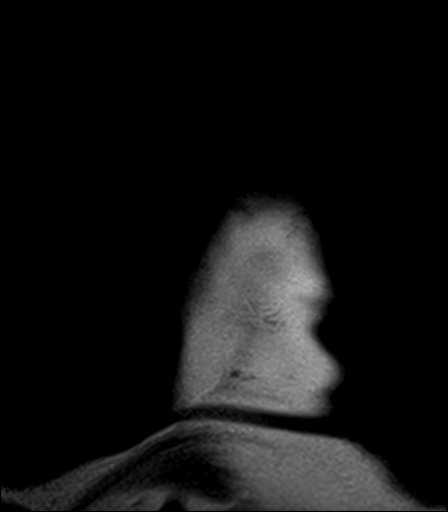
[im 9/26]
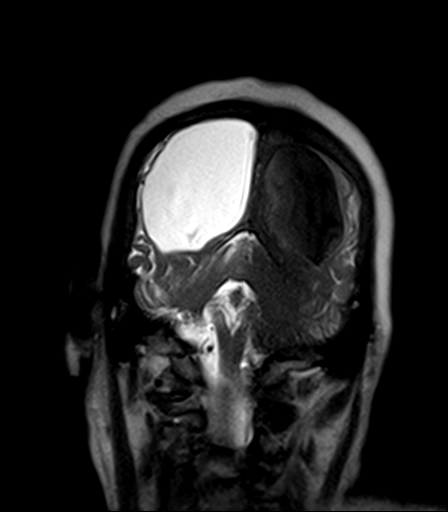
[im 17/26]
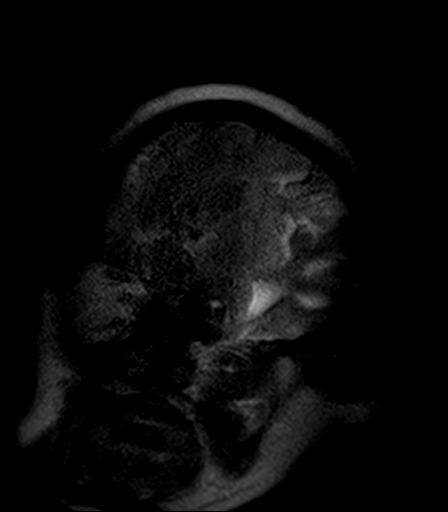
[im 26/26]
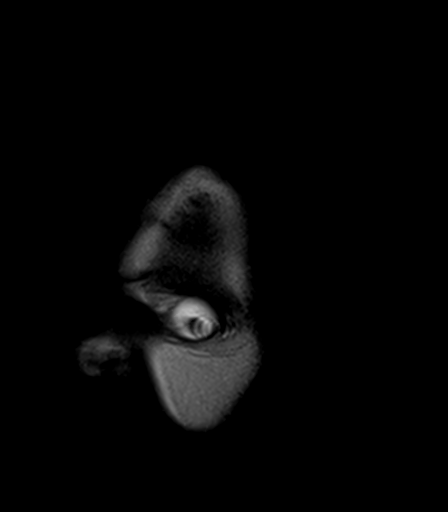

[31 of 48 positions shown; findings below may reference images not displayed]

FINDINGS: Brain: Examination severely degraded by motion artifact, with
several sequence markedly degraded and essentially nondiagnostic.

Marked ventriculomegaly of the lateral ventricles with colpocephaly.
Marked thinning of the overlying cortical mantle. Findings
presumably chronic and congenital in nature. No visible
transependymal flow of CSF on this limited exam. Suspected overlying
areas of cortical dysplasia/polymicrogyria. The cerebellum and
brainstem appear more normally formed.

Focal area of probable encephalomalacia at the anterior right
frontal lobe, suggesting prior right MCA distribution infarct.
Associated remote lacunar infarct at the right caudate better seen
on prior CT.

No convincing foci of restricted diffusion to suggest acute or
subacute ischemia. No other visible areas of chronic infarction. No
convincing foci of susceptibility artifact to suggest acute or
chronic intracranial hemorrhage.

No visible mass lesion or mass effect. No midline shift. No visible
extra-axial fluid collection.

Vascular: Major intracranial vascular flow voids are poorly assessed
on this limited exam. Normal flow void is seen within the partially
visualized basilar artery.

Skull and upper cervical spine: Craniocervical junction not well
assessed due to motion. Bone marrow signal intensity grossly within
normal limits. No visible scalp soft tissue abnormality.

Sinuses/Orbits: Globes and orbital soft tissues demonstrate no
obvious abnormality. Paranasal sinuses are largely clear. Small
chronic appearing right mastoid effusion noted.

Other: None.
IMPRESSION: 1. Severely limited exam due to extensive motion artifact.
2. No definite acute intracranial infarct or other abnormality
identified.
3. Probable remote right MCA distribution infarct involving the
right frontal lobe and right caudate.
4. Marked lateral ventriculomegaly with colpocephaly, presumably
chronic and congenital in nature.
